# Patient Record
Sex: Female | Born: 1935
Health system: Southern US, Community
[De-identification: ages and names within clinical notes are randomized; demographics above are authoritative.]

## PROBLEM LIST (undated history)

## (undated) DIAGNOSIS — J84112 Idiopathic pulmonary fibrosis: Secondary | ICD-10-CM

## (undated) DIAGNOSIS — I509 Heart failure, unspecified: Secondary | ICD-10-CM

## (undated) DIAGNOSIS — S81812A Laceration without foreign body, left lower leg, initial encounter: Secondary | ICD-10-CM

## (undated) DIAGNOSIS — K649 Unspecified hemorrhoids: Secondary | ICD-10-CM

## (undated) HISTORY — PX: TONSILLECTOMY: SUR1361

## (undated) HISTORY — DX: Unspecified hemorrhoids: K64.9

## (undated) HISTORY — DX: Laceration without foreign body, left lower leg, initial encounter: S81.812A

## (undated) HISTORY — DX: Idiopathic pulmonary fibrosis: J84.112

## (undated) HISTORY — PX: BREAST EXCISIONAL BIOPSY: SUR124

---

## 1998-04-09 ENCOUNTER — Other Ambulatory Visit: Admission: RE | Admit: 1998-04-09 | Discharge: 1998-04-09 | Payer: Self-pay | Admitting: Obstetrics and Gynecology

## 1999-04-13 ENCOUNTER — Other Ambulatory Visit: Admission: RE | Admit: 1999-04-13 | Discharge: 1999-04-13 | Payer: Self-pay | Admitting: Obstetrics and Gynecology

## 1999-08-16 ENCOUNTER — Encounter: Admission: RE | Admit: 1999-08-16 | Discharge: 1999-08-16 | Payer: Self-pay | Admitting: Obstetrics and Gynecology

## 1999-08-16 ENCOUNTER — Encounter: Payer: Self-pay | Admitting: Obstetrics and Gynecology

## 2000-05-25 ENCOUNTER — Other Ambulatory Visit: Admission: RE | Admit: 2000-05-25 | Discharge: 2000-05-25 | Payer: Self-pay | Admitting: Obstetrics and Gynecology

## 2000-08-18 ENCOUNTER — Encounter: Payer: Self-pay | Admitting: Obstetrics and Gynecology

## 2000-08-18 ENCOUNTER — Encounter: Admission: RE | Admit: 2000-08-18 | Discharge: 2000-08-18 | Payer: Self-pay | Admitting: Obstetrics and Gynecology

## 2001-07-09 ENCOUNTER — Other Ambulatory Visit: Admission: RE | Admit: 2001-07-09 | Discharge: 2001-07-09 | Payer: Self-pay | Admitting: Obstetrics and Gynecology

## 2001-08-01 ENCOUNTER — Encounter: Admission: RE | Admit: 2001-08-01 | Discharge: 2001-08-01 | Payer: Self-pay | Admitting: Obstetrics and Gynecology

## 2001-08-01 ENCOUNTER — Encounter: Payer: Self-pay | Admitting: Obstetrics and Gynecology

## 2001-09-12 ENCOUNTER — Encounter: Payer: Self-pay | Admitting: Obstetrics and Gynecology

## 2001-09-12 ENCOUNTER — Encounter: Admission: RE | Admit: 2001-09-12 | Discharge: 2001-09-12 | Payer: Self-pay | Admitting: Obstetrics and Gynecology

## 2002-07-22 ENCOUNTER — Other Ambulatory Visit: Admission: RE | Admit: 2002-07-22 | Discharge: 2002-07-22 | Payer: Self-pay | Admitting: Obstetrics and Gynecology

## 2002-08-19 ENCOUNTER — Encounter: Payer: Self-pay | Admitting: Obstetrics and Gynecology

## 2002-08-19 ENCOUNTER — Encounter: Admission: RE | Admit: 2002-08-19 | Discharge: 2002-08-19 | Payer: Self-pay | Admitting: Obstetrics and Gynecology

## 2003-08-25 ENCOUNTER — Encounter: Admission: RE | Admit: 2003-08-25 | Discharge: 2003-08-25 | Payer: Self-pay | Admitting: Obstetrics and Gynecology

## 2004-03-25 ENCOUNTER — Encounter: Admission: RE | Admit: 2004-03-25 | Discharge: 2004-03-25 | Payer: Self-pay | Admitting: Obstetrics and Gynecology

## 2004-08-26 ENCOUNTER — Encounter: Admission: RE | Admit: 2004-08-26 | Discharge: 2004-08-26 | Payer: Self-pay | Admitting: Obstetrics and Gynecology

## 2004-10-05 ENCOUNTER — Other Ambulatory Visit: Admission: RE | Admit: 2004-10-05 | Discharge: 2004-10-05 | Payer: Self-pay | Admitting: *Deleted

## 2005-09-06 ENCOUNTER — Encounter: Admission: RE | Admit: 2005-09-06 | Discharge: 2005-09-06 | Payer: Self-pay | Admitting: Internal Medicine

## 2006-05-31 ENCOUNTER — Encounter: Admission: RE | Admit: 2006-05-31 | Discharge: 2006-05-31 | Payer: Self-pay | Admitting: Obstetrics and Gynecology

## 2006-09-11 ENCOUNTER — Encounter: Admission: RE | Admit: 2006-09-11 | Discharge: 2006-09-11 | Payer: Self-pay | Admitting: Obstetrics and Gynecology

## 2006-10-24 ENCOUNTER — Other Ambulatory Visit: Admission: RE | Admit: 2006-10-24 | Discharge: 2006-10-24 | Payer: Self-pay | Admitting: Obstetrics & Gynecology

## 2007-09-17 ENCOUNTER — Encounter: Admission: RE | Admit: 2007-09-17 | Discharge: 2007-09-17 | Payer: Self-pay | Admitting: Obstetrics and Gynecology

## 2007-10-28 ENCOUNTER — Emergency Department (HOSPITAL_COMMUNITY): Admission: EM | Admit: 2007-10-28 | Discharge: 2007-10-28 | Payer: Self-pay | Admitting: Emergency Medicine

## 2008-09-22 ENCOUNTER — Encounter: Admission: RE | Admit: 2008-09-22 | Discharge: 2008-09-22 | Payer: Self-pay | Admitting: Obstetrics and Gynecology

## 2009-08-07 ENCOUNTER — Encounter: Admission: RE | Admit: 2009-08-07 | Discharge: 2009-08-07 | Payer: Self-pay | Admitting: Internal Medicine

## 2009-09-03 ENCOUNTER — Encounter (INDEPENDENT_AMBULATORY_CARE_PROVIDER_SITE_OTHER): Payer: Self-pay | Admitting: *Deleted

## 2009-09-23 ENCOUNTER — Encounter: Admission: RE | Admit: 2009-09-23 | Discharge: 2009-09-23 | Payer: Self-pay | Admitting: Internal Medicine

## 2010-03-21 ENCOUNTER — Encounter: Payer: Self-pay | Admitting: Obstetrics & Gynecology

## 2010-03-30 NOTE — Letter (Signed)
Summary: Colonoscopy Date Change Letter  [facility]  [place], [place] [address]   Phone: [phone]  Fax: [phone]      September 03, 2009 MRN: [id number]   [facility] [address] [place], [place]  [address]   Dear [name],   Previously you were recommended to have a repeat colonoscopy around this time. Your chart was recently reviewed by Dr. Lowella Bandy. [name] of [facility]. Follow up colonoscopy is now recommended in August 2014. This revised recommendation is based on current, nationally recognized guidelines for colorectal cancer screening and polyp surveillance. These guidelines are endorsed by the [place] Task Force on Colorectal Cancer as well as numerous other major medical organizations.  Please understand that our recommendation assumes that you do not have any new symptoms such as bleeding, a change in bowel habits, anemia, or significant abdominal discomfort. If you do have any concerning GI symptoms or want to discuss the guideline recommendations, please call to arrange an office visit at your earliest convenience. Otherwise we will keep you in our reminder system and contact you 1-2 months prior to the date listed above to schedule your next colonoscopy.  Thank you,  Lowella Bandy. [name], M.D.  [facility] [phone]

## 2010-07-13 NOTE — Consult Note (Signed)
[name], [name]                ACCOUNT NO.:  [id number]   MEDICAL RECORD NO.:  [id number]          PATIENT TYPE:  EMS   LOCATION:  ED                           FACILITY:  [facility]   PHYSICIAN:  Darene Lamer. Hoxworth, M.D.DATE OF BIRTH:  02-26-1935   DATE OF CONSULTATION:  10/28/2007  DATE OF DISCHARGE:  10/28/2007                                 CONSULTATION   SURGICAL CONSULTATION   CHIEF COMPLAINT:  Anal pain, swelling.   HISTORY OF PRESENT ILLNESS:  I was asked by the emergency room physician  Dr. Lauris Poag, and physician assistant, to evaluate Ms. [name].  She is a  76 year old white female who states she has long history of chronic  constipation and hemorrhoidal symptoms.  She typically takes a stool  softener to keep her bowel movements soft.  She often has to strain.  She gets occasional flare-ups of hemorrhoidal swelling and irritation  managed with over-the-counter medications.  She developed more severe  constipation about 36-48 hours ago with a feeling of difficulty passing  a bowel movement.  She gave herself an enema and did a digital  disimpaction and was able to pass stool but developed quite a lot of  swelling and pain at the anus far beyond what she typically has  experienced and presented to [facility] for evaluation.  She was seen by the physician assistant, who was concerned about a  rectal prolapse, and I was asked to the patient in consultation.  She  states that it is very uncomfortable but the pain is not severe.  She  has not had any bleeding.  No generalized abdominal pain.   PAST MEDICAL HISTORY:  Negative for any serious illness, chronic  illness, hospitalization or surgery.   MEDICATIONS:  None.   ALLERGIES:  None.   SOCIAL HISTORY:  She is nonsmoker, occasional alcohol, married.   FAMILY HISTORY:  Noncontributory.   REVIEW OF SYSTEMS:  Unremarkable except for GI as above.   PHYSICAL EXAM:  Temperature 97.1, heart rate 75,  respirations 20, blood  pressure 125/74.  GENERAL:  She is a thin, well-developed white female in no acute  distress.  SKIN:  Warm and dry.  ABDOMEN:  Nondistended, soft, nontender.  Rectal Exam:  This shows circumferential edematous external and some  internal hemorrhoids without severe inflammation or gangrenous changes.  She is mildly tender on digital rectal exam.  There is no rectal mass.  No solid stool in the rectal vault.   LABORATORY:  UA, CBC, CMET pending at this time.   ASSESSMENT/PLAN:  Edematous circumferential external hemorrhoids.  I do  not seen any evidence of full-thickness rectal prolapse.  There was no  severe inflammation or gangrenous changes at this time and these may be  able to be managed nonoperatively.  The patient will be discharged home.  Will treat with rest, frequent sitz baths, daily MiraLax to keep stools  soft and topical steroid and anesthetic agents.  I will follow up short  term in the office later this week.      Darene Lamer. Hoxworth, M.D.  Electronically  Signed     BTH/MEDQ  D:  10/28/2007  T:  10/28/2007  Job:  [id number]

## 2010-08-23 ENCOUNTER — Other Ambulatory Visit: Payer: Self-pay | Admitting: Internal Medicine

## 2010-08-23 DIAGNOSIS — Z1231 Encounter for screening mammogram for malignant neoplasm of breast: Secondary | ICD-10-CM

## 2010-09-27 ENCOUNTER — Ambulatory Visit
Admission: RE | Admit: 2010-09-27 | Discharge: 2010-09-27 | Disposition: A | Discharge status: Home / Self Care | Payer: Medicare Other | Source: Ambulatory Visit | Attending: Internal Medicine | Admitting: Internal Medicine

## 2010-09-27 DIAGNOSIS — Z1231 Encounter for screening mammogram for malignant neoplasm of breast: Secondary | ICD-10-CM

## 2010-09-28 ENCOUNTER — Ambulatory Visit: Payer: Self-pay

## 2011-01-04 ENCOUNTER — Other Ambulatory Visit: Payer: Self-pay | Admitting: Internal Medicine

## 2011-01-04 ENCOUNTER — Ambulatory Visit
Admission: RE | Admit: 2011-01-04 | Discharge: 2011-01-04 | Disposition: A | Discharge status: Home / Self Care | Payer: Medicare Other | Source: Ambulatory Visit | Attending: Internal Medicine | Admitting: Internal Medicine

## 2011-01-04 DIAGNOSIS — R059 Cough, unspecified: Secondary | ICD-10-CM

## 2011-01-04 DIAGNOSIS — R05 Cough: Secondary | ICD-10-CM

## 2011-03-17 ENCOUNTER — Other Ambulatory Visit: Payer: Self-pay

## 2011-03-17 ENCOUNTER — Encounter (HOSPITAL_COMMUNITY): Payer: Self-pay | Admitting: Emergency Medicine

## 2011-03-17 ENCOUNTER — Emergency Department (HOSPITAL_COMMUNITY)
Admission: EM | Admit: 2011-03-17 | Discharge: 2011-03-17 | Disposition: A | Discharge status: Home / Self Care | Payer: Medicare Other | Attending: Emergency Medicine | Admitting: Emergency Medicine

## 2011-03-17 ENCOUNTER — Emergency Department (HOSPITAL_COMMUNITY): Payer: Medicare Other

## 2011-03-17 DIAGNOSIS — R05 Cough: Secondary | ICD-10-CM | POA: Insufficient documentation

## 2011-03-17 DIAGNOSIS — R079 Chest pain, unspecified: Secondary | ICD-10-CM | POA: Insufficient documentation

## 2011-03-17 DIAGNOSIS — R059 Cough, unspecified: Secondary | ICD-10-CM | POA: Insufficient documentation

## 2011-03-17 DIAGNOSIS — R0602 Shortness of breath: Secondary | ICD-10-CM | POA: Insufficient documentation

## 2011-03-17 DIAGNOSIS — J8410 Pulmonary fibrosis, unspecified: Secondary | ICD-10-CM

## 2011-03-17 LAB — BASIC METABOLIC PANEL
BUN: 20 mg/dL (ref 6–23)
CO2: 26 mEq/L (ref 19–32)
Calcium: 9.5 mg/dL (ref 8.4–10.5)
Chloride: 106 mEq/L (ref 96–112)
Creatinine, Ser: 0.82 mg/dL (ref 0.50–1.10)
GFR calc Af Amer: 80 mL/min — ABNORMAL LOW (ref >90)
GFR calc non Af Amer: 69 mL/min — ABNORMAL LOW (ref >90)
Glucose, Bld: 102 mg/dL — ABNORMAL HIGH (ref 70–99)
Potassium: 3.9 mEq/L (ref 3.5–5.1)
Sodium: 141 mEq/L (ref 135–145)

## 2011-03-17 LAB — CBC
HCT: 41 % (ref 36.0–46.0)
Hemoglobin: 13.9 g/dL (ref 12.0–15.0)
MCH: 33.4 pg (ref 26.0–34.0)
MCHC: 33.9 g/dL (ref 30.0–36.0)
MCV: 98.6 fL (ref 78.0–100.0)
Platelets: 263 10*3/uL (ref 150–400)
RBC: 4.16 MIL/uL (ref 3.87–5.11)
RDW: 13.5 % (ref 11.5–15.5)
WBC: 8.1 10*3/uL (ref 4.0–10.5)

## 2011-03-17 LAB — DIFFERENTIAL
Basophils Absolute: 0 10*3/uL (ref 0.0–0.1)
Basophils Relative: 0 % (ref 0–1)
Eosinophils Absolute: 0.2 10*3/uL (ref 0.0–0.7)
Eosinophils Relative: 2 % (ref 0–5)
Lymphocytes Relative: 40 % (ref 12–46)
Lymphs Abs: 3.2 10*3/uL (ref 0.7–4.0)
Monocytes Absolute: 0.9 10*3/uL (ref 0.1–1.0)
Monocytes Relative: 12 % (ref 3–12)
Neutro Abs: 3.7 10*3/uL (ref 1.7–7.7)
Neutrophils Relative %: 46 % (ref 43–77)

## 2011-03-17 LAB — CARDIAC PANEL(CRET KIN+CKTOT+MB+TROPI)
CK, MB: 3.4 ng/mL (ref 0.3–4.0)
Relative Index: 3 — ABNORMAL HIGH (ref 0.0–2.5)
Total CK: 112 U/L (ref 7–177)
Troponin I: <0.3 ng/mL (ref <0.30)

## 2011-03-17 LAB — POCT I-STAT TROPONIN I: Troponin i, poc: 0 ng/mL (ref 0.00–0.08)

## 2011-03-17 LAB — D-DIMER, QUANTITATIVE (NOT AT ARMC): D-Dimer, Quant: 0.3 ug/mL-FEU (ref 0.00–0.48)

## 2011-03-17 NOTE — ED Notes (Signed)
Pt resting quietly, no s/s of any pain or distress. Pt denies any pain or complaints at this time, plan of care is updated with verbal understanding. Will continue to monitor pt, pt is awaiting MD re-evaluation for disposition.

## 2011-03-17 NOTE — ED Provider Notes (Signed)
History     CSN: [id number]  Arrival date & time 03/17/11  0005   First MD Initiated Contact with Patient 03/17/11 0053      Chief Complaint  Patient presents with  . Chest Pain    (Consider location/radiation/quality/duration/timing/severity/associated sxs/prior treatment) HPI Comments: For the last 6 weeks patient has had upper respiratory symptoms including cough, wheezing, shortness of breath. She was started on a steroid inhaler 2 weeks ago which improved some of the symptoms but a week ago she developed chest tightness. She states it is a band-like distribution involving her entire chest and back.  She states with moderate exertion she has some shortness of breath but denies any other symptoms.  Patient is a 77 y.o. female presenting with chest pain. The history is provided by the patient.  Chest Pain The chest pain began 3 - 5 days ago. Chest pain occurs constantly. The chest pain is worsening. The pain is associated with breathing and coughing. At its most intense, the pain is at 5/10. The pain is currently at 5/10. The severity of the pain is moderate. The quality of the pain is described as aching, heavy and tightness. The pain radiates to the upper back (The pain is a band around her entire chest and back). Chest pain is worsened by deep breathing. Primary symptoms include shortness of breath, cough and wheezing. Pertinent negatives for primary symptoms include no fever, no fatigue, no abdominal pain, no nausea, no vomiting and no dizziness.  Pertinent negatives for associated symptoms include no diaphoresis, no lower extremity edema and no weakness. Treatments tried: She has been on steroid inhaler for the last 2 weeks with mild improvement. Risk factors include no known risk factors.  Pertinent negatives for past medical history include no CAD, no cancer, no COPD, no diabetes, no DVT, no hyperlipidemia, no hypertension, no MI and no PE.  Procedure history is negative for cardiac  catheterization.     History reviewed. No pertinent past medical history.  History reviewed. No pertinent past surgical history.  No family history on file.  History  Substance Use Topics  . Smoking status: Never Smoker   . Smokeless tobacco: Not on file  . Alcohol Use: Yes    OB History    Grav Para Term Preterm Abortions TAB SAB Ect Mult Living                  Review of Systems  Constitutional: Negative for fever, diaphoresis and fatigue.  Respiratory: Positive for cough, shortness of breath and wheezing.   Cardiovascular: Positive for chest pain.  Gastrointestinal: Negative for nausea, vomiting and abdominal pain.  Neurological: Negative for dizziness and weakness.  All other systems reviewed and are negative.    Allergies  Review of patient's allergies indicates no known allergies.  Home Medications   Current Outpatient Rx  Name Route Sig Dispense Refill  . BUDESONIDE 180 MCG/ACT IN AEPB Inhalation Inhale 1 puff into the lungs 2 (two) times daily.    [name] CALCIUM CARBONATE 600 MG PO TABS Oral Take 600 mg by mouth daily.    Creed Copper M PLUS PO TABS Oral Take 1 tablet by mouth daily.      BP 132/73  Pulse 79  Temp(Src) 97.3 F (36.3 C) (Oral)  Resp 17  SpO2 98%  Physical Exam  Nursing note and vitals reviewed. Constitutional: She is oriented to person, place, and time. She appears well-developed and well-nourished. She appears distressed.  HENT:  Head: Normocephalic and  atraumatic.  Mouth/Throat: Oropharynx is clear and moist.  Eyes: EOM are normal. Pupils are equal, round, and reactive to light.  Neck: Normal range of motion. Neck supple.  Cardiovascular: Normal rate, regular rhythm, normal heart sounds and intact distal pulses.  Exam reveals no friction rub.   No murmur heard. Pulmonary/Chest: Effort normal. She has wheezes. She has no rales. She exhibits no tenderness.       Fine crackles in the lung bases  Abdominal: Soft. Bowel sounds are normal. She  exhibits no distension. There is no tenderness. There is no rebound and no guarding.  Musculoskeletal: Normal range of motion. She exhibits no tenderness.       No edema  Neurological: She is alert and oriented to person, place, and time. No cranial nerve deficit.  Skin: Skin is warm and dry. No rash noted.  Psychiatric: She has a normal mood and affect. Her behavior is normal.    ED Course  Procedures (including critical care time)  Labs Reviewed  BASIC METABOLIC PANEL - Abnormal; Notable for the following:    Glucose, Bld 102 (*)    GFR calc non Af Amer 69 (*)    GFR calc Af Amer 80 (*)    All other components within normal limits  CARDIAC PANEL(CRET KIN+CKTOT+MB+TROPI) - Abnormal; Notable for the following:    Relative Index 3.0 (*)    All other components within normal limits  CBC  DIFFERENTIAL  POCT I-STAT TROPONIN I  D-DIMER, QUANTITATIVE  I-STAT TROPONIN I   Dg Chest 2 View  03/17/2011  *RADIOLOGY REPORT*  Clinical Data: Chest pain, cough  CHEST - 2 VIEW  Comparison: 01/04/2011, 08/07/2009  Findings:  stable heart size and vascularity.  Asymmetric interstitial fibrotic changes throughout the left lung and right lower lobe.  No significant interval change.  No superimposed pneumonia or edema.  No effusion or pneumothorax.  Trachea midline. Degenerative changes of the spine.  IMPRESSION: Stable asymmetric interstitial fibrosis.  No superimposed acute process  Original Report Authenticated By: Jerilynn Mages. Daryll Brod, M.D.    Date: 03/17/2011  Rate: 76  Rhythm: normal sinus rhythm  QRS Axis: normal  Intervals: normal  ST/T Wave abnormalities: nonspecific ST changes  Conduction Disutrbances:none  Narrative Interpretation:   Old EKG Reviewed: none available    1. Pulmonary fibrosis       MDM   Patient presenting with constant ongoing chest pain for the last 3-4 days. She states it is gradually getting worse but is not associated with diaphoresis, nausea, vomiting, fever. She  is a TIMI 1 for age only and has no risk factors for coronary artery disease. Her EKG is unremarkable and chest x-ray shows an asymmetrical interstitial fibrosis. She states that over the last 6 weeks she has had persistent cough and bronchial symptoms. She went on Pulmicort 2 weeks ago due to the persistent cough and wheezing and the inability to tolerate oral steroids. She feels as though the symptoms are improving but states she still gets short of breath with exertion. On exam she has fine crackles in both bases and some intermittent wheezing but otherwise exam is within normal limits. Low likelihood for cardiac disease and her cardiac enzymes were negative. With persistent pain for 3 days if this were cardiac but would expect her cardiac enzymes to be elevated and she states the pain has never gone away. Chest x-ray shows an interstitial fibrosis which is most likely the reason for the above physical exam findings. D-dimer is negative and  low likelihood for clots. The rest of the lab work is unremarkable. Findings discussed with the patient and she is going to followup with Dr. Laurann Montana for further evaluation of her pulmonary issues.        Blanchie Dessert, MD 03/17/11 [phone]

## 2011-03-17 NOTE — ED Notes (Addendum)
C/o pain across chest and sob since Monday.  States it feels like a band of heat around chest that is radiating up into neck. Denies nausea and vomiting.

## 2011-09-15 ENCOUNTER — Other Ambulatory Visit: Payer: Self-pay | Admitting: Family Medicine

## 2011-09-15 DIAGNOSIS — Z1231 Encounter for screening mammogram for malignant neoplasm of breast: Secondary | ICD-10-CM

## 2011-09-29 ENCOUNTER — Ambulatory Visit
Admission: RE | Admit: 2011-09-29 | Discharge: 2011-09-29 | Disposition: A | Discharge status: Home / Self Care | Payer: Medicare Other | Source: Ambulatory Visit | Attending: Family Medicine | Admitting: Family Medicine

## 2011-09-29 ENCOUNTER — Other Ambulatory Visit: Payer: Self-pay | Admitting: Internal Medicine

## 2011-09-29 DIAGNOSIS — Z1231 Encounter for screening mammogram for malignant neoplasm of breast: Secondary | ICD-10-CM

## 2011-10-17 ENCOUNTER — Encounter: Payer: Self-pay | Admitting: *Deleted

## 2011-10-17 ENCOUNTER — Telehealth: Payer: Self-pay | Admitting: Internal Medicine

## 2011-10-17 NOTE — Telephone Encounter (Signed)
Left a message for patient to call me. 

## 2011-10-17 NOTE — Telephone Encounter (Signed)
Spoke with patient and scheduled her on 11/18/11 at 2:00 PM. If someone cancels, call her at home or cell [phone]

## 2011-10-18 ENCOUNTER — Encounter: Payer: Self-pay | Admitting: Internal Medicine

## 2011-10-18 ENCOUNTER — Ambulatory Visit (INDEPENDENT_AMBULATORY_CARE_PROVIDER_SITE_OTHER): Payer: Medicare Other | Admitting: Internal Medicine

## 2011-10-18 VITALS — BP 110/76 | HR 80 | Ht 63.25 in | Wt 129.4 lb

## 2011-10-18 DIAGNOSIS — R109 Unspecified abdominal pain: Secondary | ICD-10-CM

## 2011-10-18 DIAGNOSIS — K5900 Constipation, unspecified: Secondary | ICD-10-CM

## 2011-10-18 MED ORDER — MOVIPREP 100 G PO SOLR: ORAL | Status: DC

## 2011-10-18 NOTE — Patient Instructions (Addendum)
You have been scheduled for a colonoscopy with propofol. Please follow written instructions given to you at your visit today.  Please pick up your prep kit at the pharmacy within the next 1-3 days. If you use inhalers (even only as needed), please bring them with you on the day of your procedure. CC: Dr [name]

## 2011-10-18 NOTE — Progress Notes (Signed)
[place] 1934-11-04 MRN [id number]   History of Present Illness:  This is a 77 year old white female with chronic constipation. She has had several episodes of severe lower abdominal pain lasting 10-15 minutes. 2 of them occurred in the morning and one at night. The episode subsided spontaneously. She has had increasing constipation for which she takes MiraLax 17 g weekly and more recently 3 times a week. She has good eating habits and her weight has remained stable. Her last colonoscopy in 2004 mentioned a torturous colon and external and internal hemorrhoids. The hemorrhoids were evaluated by a surgeon in 2009 but treated conservatively they don't seem to be a problem currently. There is no family history of colon cancer. She remembers her mother being constipated.   Past Medical History  Diagnosis Date  . Hemorrhoids    Past Surgical History  Procedure Date  . Breast excisional biopsy     left    reports that she has quit smoking. Her smoking use included Cigarettes. She has never used smokeless tobacco. She reports that she drinks alcohol. She reports that she does not use illicit drugs. family history includes Stroke in her father. No Known Allergies      Review of Systems: Denies upper GI symptoms of heartburn chest pain regurgitation  The remainder of the 10 point ROS is negative except as outlined in H&P   Physical Exam: General appearance  Well developed, in no distress. Eyes- non icteric. HEENT nontraumatic, normocephalic. Mouth no lesions, tongue papillated, no cheilosis. Neck supple without adenopathy, thyroid not enlarged, no carotid bruits, no JVD. Lungs Clear to auscultation bilaterally. Cor normal S1, normal S2, regular rhythm, no murmur,  quiet precordium. Abdomen: Soft relaxed abdomen with normoactive bowel sounds. No distention. Liver edge at costal margin. No surgical scars.  Rectal: Small amount of soft Hemoccult negative stool. Extremities no pedal  edema. Skin no lesions. Neurological alert and oriented x 3. Psychological normal mood and affect.  Assessment and Plan:  Problem #1 Chronic progressive constipation most likely due to slow transit. We need to rule out significant diverticular disease. She has a known redundant colon from a prior colonoscopy in 2004. We will increase her MiraLax to 17 g 3 times a week and will add Colace 100 mg every day. We will set her up for a colonoscopy to rule out obstruction or neoplasm.   10/18/2011 Delfin Edis

## 2011-11-02 ENCOUNTER — Encounter: Payer: Self-pay | Admitting: Internal Medicine

## 2011-11-02 ENCOUNTER — Ambulatory Visit (AMBULATORY_SURGERY_CENTER): Payer: Medicare Other | Admitting: Internal Medicine

## 2011-11-02 VITALS — BP 136/79 | HR 71 | Temp 98.0°F | Resp 36 | Ht 63.0 in | Wt 125.0 lb

## 2011-11-02 DIAGNOSIS — K5900 Constipation, unspecified: Secondary | ICD-10-CM

## 2011-11-02 DIAGNOSIS — R109 Unspecified abdominal pain: Secondary | ICD-10-CM

## 2011-11-02 MED ORDER — SODIUM CHLORIDE 0.9 % IV SOLN: 500.0000 mL | INTRAVENOUS | Status: DC

## 2011-11-02 NOTE — Progress Notes (Signed)
No complaints noted in the recovery room. Maw  Patient did not experience any of the following events: a burn prior to discharge; a fall within the facility; wrong site/side/patient/procedure/implant event; or a hospital transfer or hospital admission upon discharge from the facility. [phone]) Patient did not have preoperative order for IV antibiotic SSI prophylaxis. [phone])

## 2011-11-02 NOTE — Op Note (Signed)
[facility]  [address] [place], [id number]   COLONOSCOPY PROCEDURE REPORT  PATIENT: Terry, Conway  MR#: [id number] BIRTHDATE: 04-29-1934 , 77  yrs. old GENDER: Female ENDOSCOPIST: Lafayette Dragon, MD REFERRED BY:  [name], M.D. PROCEDURE DATE:  11/02/2011 PROCEDURE:   Colonoscopy, diagnostic ASA CLASS:   Class II INDICATIONS:constipation and last colon 2004- redundant colon. MEDICATIONS: MAC sedation, administered by CRNA and Propofol (Diprivan) 300 mg IV  DESCRIPTION OF PROCEDURE:   After the risks and benefits and of the procedure were explained, informed consent was obtained.  A digital rectal exam revealed no abnormalities of the rectum.    The LB CF-[id number]  endoscope was introduced through the anus and advanced to the cecum, which was identified by the ileocecal valve .  The quality of the prep was good, using MoviPrep .  The instrument was then slowly withdrawn as the colon was fully examined.     COLON FINDINGS: Tortuous transverse and right colon, hypotonic colon, decreased abdominal muscle tone, we had to switch to a stiffer scope combined with abdominal pressure and repositioning of the patient.   Mild diverticulosis was noted in the sigmoid colon.     Retroflexed views revealed no abnormalities.     The scope was then withdrawn from the patient and the procedure completed.  COMPLICATIONS: There were no complications. ENDOSCOPIC IMPRESSION: 1.   Tortuous transverse and right colon, hypotonic colon, decreased abdominal muscle tone, we had to switch to a stiffer scope combined with abdominal pressure and repositioning of the patient 2.   Mild diverticulosis was noted in the sigmoid colon  RECOMMENDATIONS: 1.  High fiber diet 2.  continue Miralax 17gm at least 3x/week, or more frequently as needed,may need to add other stimulants such as Senokott, Linzes or Mag. Oxide prn   REPEAT EXAM: In 0 year(s)  for Colonoscopy. , no  recall due to age  cc:  _______________________________ eSigned:  Lafayette Dragon, MD 11/02/2011 11:49 AM

## 2011-11-02 NOTE — Patient Instructions (Addendum)
Handouts were given to your care partner on diverticulosis, high fiber diet and hemorrhoids.  You may resume your current medications today.  Please call if any questions or concerns.    YOU HAD AN ENDOSCOPIC PROCEDURE TODAY AT [facility]: Refer to the procedure report that was given to you for any specific questions about what was found during the examination.  If the procedure report does not answer your questions, please call your gastroenterologist to clarify.  If you requested that your care partner not be given the details of your procedure findings, then the procedure report has been included in a sealed envelope for you to review at your convenience later.  YOU SHOULD EXPECT: Some feelings of bloating in the abdomen. Passage of more gas than usual.  Walking can help get rid of the air that was put into your GI tract during the procedure and reduce the bloating. If you had a lower endoscopy (such as a colonoscopy or flexible sigmoidoscopy) you may notice spotting of blood in your stool or on the toilet paper. If you underwent a bowel prep for your procedure, then you may not have a normal bowel movement for a few days.  DIET: Your first meal following the procedure should be a light meal and then it is ok to progress to your normal diet.  A half-sandwich or bowl of soup is an example of a good first meal.  Heavy or fried foods are harder to digest and may make you feel nauseous or bloated.  Likewise meals heavy in dairy and vegetables can cause extra gas to form and this can also increase the bloating.  Drink plenty of fluids but you should avoid alcoholic beverages for 24 hours.   ACTIVITY: Your care partner should take you home directly after the procedure.  You should plan to take it easy, moving slowly for the rest of the day.  You can resume normal activity the day after the procedure however you should NOT DRIVE or use heavy machinery for 24 hours (because of thFollowing  upper endoscopy (EGD)  Voe sedation medicines used during the test).    SYMPTOMS TO REPORT IMMEDIATELY: A gastroenterologist can be reached at any hour.  During normal business hours, 8:30 AM to 5:00 PM Monday through Friday, call [phone].  After hours and on weekends, please call the GI answering service at [phone] who will take a message and have the physician on call contact you.   Following lower endoscopy (colonoscopy or flexible sigmoidoscopy):  Excessive amounts of blood in the stool  Significant tenderness or worsening of abdominal pains  Swelling of the abdomen that is new, acute  Fever of 100F or higher   FOLLOW UP: If any biopsies were taken you will be contacted by phone or by letter within the next 1-3 weeks.  Call your gastroenterologist if you have not heard about the biopsies in 3 weeks.  Our staff will call the home number listed on your records the next business day following your procedure to check on you and address any questions or concerns that you may have at that time regarding the information given to you following your procedure. This is a courtesy call and so if there is no answer at the home number and we have not heard from you through the emergency physician on call, we will assume that you have returned to your regular daily activities without incident.  SIGNATURES/CONFIDENTIALITY: You and/or your care partner have signed paperwork  which will be entered into your electronic medical record.  These signatures attest to the fact that that the information above on your After Visit Summary has been reviewed and is understood.  Full responsibility of the confidentiality of this discharge information lies with you and/or your care-partner.

## 2011-11-03 ENCOUNTER — Telehealth: Payer: Self-pay

## 2011-11-03 NOTE — Telephone Encounter (Signed)
  Follow up Call-  Call back number 11/02/2011  Post procedure Call Back phone  # [phone]  Permission to leave phone message Yes     Patient questions:  Do you have a fever, pain , or abdominal swelling? no Pain Score  0 *  Have you tolerated food without any problems? yes  Have you been able to return to your normal activities? yes  Do you have any questions about your discharge instructions: Diet   no Medications  no Follow up visit  no  Do you have questions or concerns about your Care? no  Actions: * If pain score is 4 or above: No action needed, pain <4.

## 2011-11-18 ENCOUNTER — Ambulatory Visit: Payer: Medicare Other | Admitting: Internal Medicine

## 2011-12-02 ENCOUNTER — Encounter: Payer: Medicare Other | Admitting: Internal Medicine

## 2012-07-20 ENCOUNTER — Other Ambulatory Visit: Payer: Self-pay | Admitting: Internal Medicine

## 2012-07-20 DIAGNOSIS — N644 Mastodynia: Secondary | ICD-10-CM

## 2012-07-30 ENCOUNTER — Ambulatory Visit
Admission: RE | Admit: 2012-07-30 | Discharge: 2012-07-30 | Disposition: A | Discharge status: Home / Self Care | Payer: Medicare Other | Source: Ambulatory Visit | Attending: Internal Medicine | Admitting: Internal Medicine

## 2012-07-30 ENCOUNTER — Other Ambulatory Visit: Payer: Self-pay | Admitting: Internal Medicine

## 2012-07-30 DIAGNOSIS — N644 Mastodynia: Secondary | ICD-10-CM

## 2012-10-15 ENCOUNTER — Other Ambulatory Visit: Payer: Self-pay

## 2012-10-15 DIAGNOSIS — Z1231 Encounter for screening mammogram for malignant neoplasm of breast: Secondary | ICD-10-CM

## 2012-10-25 ENCOUNTER — Ambulatory Visit
Admission: RE | Admit: 2012-10-25 | Discharge: 2012-10-25 | Disposition: A | Discharge status: Home / Self Care | Payer: Medicare Other | Source: Ambulatory Visit

## 2012-10-25 ENCOUNTER — Ambulatory Visit: Payer: Medicare Other

## 2012-10-25 DIAGNOSIS — Z1231 Encounter for screening mammogram for malignant neoplasm of breast: Secondary | ICD-10-CM

## 2013-09-10 ENCOUNTER — Other Ambulatory Visit: Payer: Self-pay

## 2013-09-10 DIAGNOSIS — Z1231 Encounter for screening mammogram for malignant neoplasm of breast: Secondary | ICD-10-CM

## 2013-10-28 ENCOUNTER — Encounter (INDEPENDENT_AMBULATORY_CARE_PROVIDER_SITE_OTHER): Payer: Self-pay

## 2013-10-28 ENCOUNTER — Ambulatory Visit
Admission: RE | Admit: 2013-10-28 | Discharge: 2013-10-28 | Disposition: A | Discharge status: Home / Self Care | Payer: Medicare HMO | Source: Ambulatory Visit

## 2013-10-28 ENCOUNTER — Ambulatory Visit: Payer: Medicare Other

## 2013-10-28 DIAGNOSIS — Z1231 Encounter for screening mammogram for malignant neoplasm of breast: Secondary | ICD-10-CM

## 2014-08-11 ENCOUNTER — Ambulatory Visit
Admission: RE | Admit: 2014-08-11 | Discharge: 2014-08-11 | Disposition: A | Discharge status: Home / Self Care | Payer: Medicare HMO | Source: Ambulatory Visit | Attending: Internal Medicine | Admitting: Internal Medicine

## 2014-08-11 ENCOUNTER — Other Ambulatory Visit: Payer: Self-pay | Admitting: Internal Medicine

## 2014-08-11 DIAGNOSIS — R0609 Other forms of dyspnea: Secondary | ICD-10-CM

## 2014-08-11 DIAGNOSIS — R0600 Dyspnea, unspecified: Secondary | ICD-10-CM

## 2014-09-29 ENCOUNTER — Other Ambulatory Visit: Payer: Self-pay

## 2014-09-29 DIAGNOSIS — Z1231 Encounter for screening mammogram for malignant neoplasm of breast: Secondary | ICD-10-CM

## 2014-10-20 ENCOUNTER — Other Ambulatory Visit (INDEPENDENT_AMBULATORY_CARE_PROVIDER_SITE_OTHER): Payer: Medicare HMO

## 2014-10-20 ENCOUNTER — Ambulatory Visit (INDEPENDENT_AMBULATORY_CARE_PROVIDER_SITE_OTHER): Payer: Medicare HMO | Admitting: Pulmonary Disease

## 2014-10-20 ENCOUNTER — Encounter: Payer: Self-pay | Admitting: Pulmonary Disease

## 2014-10-20 VITALS — BP 122/74 | HR 73 | Ht 65.0 in | Wt 131.6 lb

## 2014-10-20 DIAGNOSIS — J84112 Idiopathic pulmonary fibrosis: Secondary | ICD-10-CM

## 2014-10-20 LAB — HEPATIC FUNCTION PANEL
ALT: 14 U/L (ref 0–35)
AST: 22 U/L (ref 0–37)
Albumin: 4.1 g/dL (ref 3.5–5.2)
Alkaline Phosphatase: 67 U/L (ref 39–117)
Bilirubin, Direct: 0.1 mg/dL (ref 0.0–0.3)
Total Bilirubin: 0.6 mg/dL (ref 0.2–1.2)
Total Protein: 7.3 g/dL (ref 6.0–8.3)

## 2014-10-20 NOTE — Progress Notes (Signed)
Subjective:    Patient ID: [name], female    DOB: 02/21/1935, 80 y.o.   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 She had a chest x-ray done 07/2014 which showed bilateral interstitial infiltrates hence the referral. She reports increased dyspnea on exertion, especially when she is climbing stairs over the last few months. She reports severe bout of bronchitis in 2011, CT chest 07/2009 was reviewed-high-resolution images showed peripheral reticulation, basilar honeycombing and mild bronchiectasis consistent with pulmonary fibrosis. I also reviewed a chest x-ray from 06/2006 which showed early interstitial prominence at the left base. She also reports a dry cough, sometimes worse when lying down. She has general decrease in energy level and denies skin rash, arthritis or pedal edema. She smoked less than 10 pack years and quit in 1985-a pack would last her about a week. She has lived in different locations on the [place] with her husband, has been in a recliner for at least the last 25 years. 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 She had a negative Cardiolite stress test in 2004.  Labs from PCP-including LFTs are normal She didn't desaturate to 88% on the second lap but recovered to 91% on the third lap.  Past Medical History  Diagnosis Date  . Hemorrhoids     Past Surgical History  Procedure Laterality Date  . Breast excisional biopsy      left    Allergies  Allergen Reactions  . Prednisone     hallucination    Social History   Social History  . Marital Status: Married    Spouse Name: N/A  . Number of Children: 2  . Years of Education: N/A   Occupational History  . retired    Social History Main Topics  . Smoking status: Former Smoker    Types: Cigarettes  . Smokeless tobacco: Never Used  . Alcohol Use: 4.2 oz/week    7 Shots of liquor per week   Comment: 1 per day  . Drug Use: No  . Sexual Activity: Not on file   Other Topics Concern  . Not on file   Social History Narrative    Family History  Problem Relation Age of Onset  . Stroke Father      Review of Systems  Constitutional: Negative for fever, chills and unexpected weight change.  HENT: Negative for congestion, dental problem, ear pain, nosebleeds, postnasal drip, rhinorrhea, sinus pressure, sneezing, sore throat, trouble swallowing and voice change.   Eyes: Negative for visual disturbance.  Respiratory: Negative for cough, choking and shortness of breath.   Cardiovascular: Negative for chest pain and leg swelling.  Gastrointestinal: Negative for vomiting, abdominal pain and diarrhea.  Genitourinary: Negative for difficulty urinating.  Musculoskeletal: Negative for arthralgias.  Skin: Negative for rash.  Neurological: Negative for tremors, syncope and headaches.  Hematological: Does not bruise/bleed easily.       Objective:   Physical Exam  Gen. Pleasant, well-nourished, in no distress, normal affect ENT - no lesions, no post nasal drip Neck: No JVD, no thyromegaly, no carotid bruits Lungs: no use of accessory muscles, no dullness to percussion,bibasal rales, no rhonchi  Cardiovascular: Rhythm regular, heart sounds  normal, no murmurs or gallops, no peripheral edema Abdomen: soft and non-tender, no hepatosplenomegaly, BS normal. Musculoskeletal: No deformities, no cyanosis or clubbing Neuro:  alert, non focal       Assessment & Plan:

## 2014-10-20 NOTE — Patient Instructions (Signed)
High resolution CT scan of chest  - no contrast Full pFTs Blood work for inflammatory causes We briefly discussed pulmonary fibrosis

## 2014-10-20 NOTE — Assessment & Plan Note (Addendum)
The high-resolution CT appearance from 2011 appears to be typical for idiopathic pulmonary fibrosis. He does seem to have a subpleural peripheral distribution with basilar predominance, some honeycombing and traction bronchiectasis.  High resolution CT scan of chest  - no contrast Full pFTs Blood work for inflammatory causes including CCP, ANA ESR We briefly discussed pulmonary fibrosis and its course  We will regroup in 2 weeks after above testing and discuss medications

## 2014-10-21 LAB — ANA: Anti Nuclear Antibody(ANA): NEGATIVE

## 2014-10-21 LAB — SEDIMENTATION RATE: Sed Rate: 24 mm/hr — ABNORMAL HIGH (ref 0–22)

## 2014-10-22 LAB — CYCLIC CITRUL PEPTIDE ANTIBODY, IGG: Cyclic Citrullin Peptide Ab: <2 U/mL (ref 0.0–5.0)

## 2014-10-27 ENCOUNTER — Ambulatory Visit (INDEPENDENT_AMBULATORY_CARE_PROVIDER_SITE_OTHER)
Admission: RE | Admit: 2014-10-27 | Discharge: 2014-10-27 | Disposition: A | Discharge status: Home / Self Care | Payer: Medicare HMO | Source: Ambulatory Visit | Attending: Pulmonary Disease | Admitting: Pulmonary Disease

## 2014-10-27 DIAGNOSIS — J84112 Idiopathic pulmonary fibrosis: Secondary | ICD-10-CM | POA: Diagnosis not present

## 2014-10-28 ENCOUNTER — Ambulatory Visit (INDEPENDENT_AMBULATORY_CARE_PROVIDER_SITE_OTHER): Payer: Medicare HMO | Admitting: Pulmonary Disease

## 2014-10-28 DIAGNOSIS — J84112 Idiopathic pulmonary fibrosis: Secondary | ICD-10-CM

## 2014-10-28 LAB — PULMONARY FUNCTION TEST
DL/VA % pred: 80 %
DL/VA: 3.78 ml/min/mmHg/L
DLCO unc % pred: 52 %
DLCO unc: 12.16 ml/min/mmHg
FEF 25-75 Post: 3.25 L/sec
FEF 25-75 Pre: 3.49 L/sec
FEF2575-%Change-Post: -6 %
FEF2575-%Pred-Post: 224 %
FEF2575-%Pred-Pre: 240 %
FEV1-%Change-Post: 0 %
FEV1-%Pred-Post: 101 %
FEV1-%Pred-Pre: 102 %
FEV1-Post: 1.96 L
FEV1-Pre: 1.97 L
FEV1FVC-%Change-Post: 0 %
FEV1FVC-%Pred-Pre: 121 %
FEV6-%Change-Post: 0 %
FEV6-%Pred-Post: 88 %
FEV6-%Pred-Pre: 89 %
FEV6-Post: 2.17 L
FEV6-Pre: 2.19 L
FEV6FVC-%Change-Post: 0 %
FEV6FVC-%Pred-Post: 104 %
FEV6FVC-%Pred-Pre: 105 %
FVC-%Change-Post: 0 %
FVC-%Pred-Post: 84 %
FVC-%Pred-Pre: 84 %
FVC-Post: 2.18 L
FVC-Pre: 2.19 L
Post FEV1/FVC ratio: 90 %
Post FEV6/FVC ratio: 99 %
Pre FEV1/FVC ratio: 90 %
Pre FEV6/FVC Ratio: 100 %
RV % pred: 83 %
RV: 1.93 L
TLC % pred: 83 %
TLC: 4.11 L

## 2014-10-28 NOTE — Progress Notes (Signed)
PFT done today. 

## 2014-11-05 ENCOUNTER — Encounter: Payer: Self-pay | Admitting: Pulmonary Disease

## 2014-11-05 ENCOUNTER — Ambulatory Visit (INDEPENDENT_AMBULATORY_CARE_PROVIDER_SITE_OTHER): Payer: Medicare HMO | Admitting: Pulmonary Disease

## 2014-11-05 ENCOUNTER — Telehealth: Payer: Self-pay | Admitting: Pulmonary Disease

## 2014-11-05 VITALS — BP 110/70 | HR 82 | Ht 65.0 in | Wt 132.2 lb

## 2014-11-05 DIAGNOSIS — J84112 Idiopathic pulmonary fibrosis: Secondary | ICD-10-CM

## 2014-11-05 DIAGNOSIS — Z23 Encounter for immunization: Secondary | ICD-10-CM | POA: Diagnosis not present

## 2014-11-05 NOTE — Telephone Encounter (Signed)
Spoke with the pt  She states that she read online that medicare does not cover the [place], but would like to get started on this med  She wants to try and get pt assistance for this  I advised will let RA know that she wants to try med and will go from there

## 2014-11-05 NOTE — Patient Instructions (Signed)
We discussed tests confirming IPF We discussed course & medications - perfenidone & OFEV Call if you are ready to start Flu shot

## 2014-11-05 NOTE — Progress Notes (Signed)
   Subjective:    Patient ID: [name], female    DOB: 18-May-1934, 80 y.o.   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 CXR 07/2014 which showed bilateral interstitial infiltrates hence the referral.  She reports severe bout of bronchitis in 2011, CT chest 07/2009-  peripheral reticulation, basilar honeycombing and mild bronchiectasis consistent with pulmonary fibrosis. CXR from 06/2006 which showed early interstitial prominence at the left base.  She smoked less than 10 pack years and quit in 1985-a pack would last her about a week. She has lived in different locations on the [place] with her husband, has been in a recliner for at least the last 25 years. 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 She had a negative Cardiolite stress test in 2004.  She reports increased dyspnea on exertion, especially when she is climbing stairs over the last few months. She also reports a dry cough, sometimes worse when lying down. She has general decrease in energy level and denies skin rash, arthritis or pedal edema.  Significant tests/ events  Labs from PCP-including LFTs are normal, ANA neg, CCP neg  PFTs - preserved lung volumes, DLCO 52% She did desaturate to 88% on the second lap but recovered to 91% on the third lap   Review of Systems neg for any significant sore throat, dysphagia, itching, sneezing, nasal congestion or excess/ purulent secretions, fever, chills, sweats, unintended wt loss, pleuritic or exertional cp, hempoptysis, orthopnea pnd or change in chronic leg swelling. Also denies presyncope, palpitations, heartburn, abdominal pain, nausea, vomiting, diarrhea or change in bowel or urinary habits, dysuria,hematuria, rash, arthralgias, visual complaints, headache, numbness weakness or ataxia.     Objective:   Physical Exam  Gen. Pleasant, well-nourished, in no distress, normal affect ENT - no lesions, no post  nasal drip Neck: No JVD, no thyromegaly, no carotid bruits Lungs: no use of accessory muscles, no dullness to percussion, bibasal fine rales, no rhonchi  Cardiovascular: Rhythm regular, heart sounds  normal, no murmurs or gallops, no peripheral edema Abdomen: soft and non-tender, no hepatosplenomegaly, BS normal. Musculoskeletal: No deformities, no cyanosis or clubbing Neuro:  alert, non focal        Assessment & Plan:

## 2014-11-06 ENCOUNTER — Ambulatory Visit
Admission: RE | Admit: 2014-11-06 | Discharge: 2014-11-06 | Disposition: A | Discharge status: Home / Self Care | Payer: Medicare HMO | Source: Ambulatory Visit

## 2014-11-06 DIAGNOSIS — Z1231 Encounter for screening mammogram for malignant neoplasm of breast: Secondary | ICD-10-CM

## 2014-11-06 NOTE — Assessment & Plan Note (Signed)
We had an extensive discussion about the diagnosis of IPF, disease course and prognosis and the new treatments available. We discussed these treatments in detail including potential adverse effects. I do not think that a biopsy is necessary here-radiologic appearances typical for IPF. I would advise starting OFEV at 150 mcg twice daily-if tolerated. Her baseline LFTs normal, this will be monitored. We discussed signs of an IPF flare Flu shot will be administered today

## 2014-11-06 NOTE — Telephone Encounter (Signed)
Pl start Rx process for OFEV

## 2014-11-07 NOTE — Telephone Encounter (Signed)
Ofev paperwork initiated.  Placed in Dr Bari Mantis look at to sign form. Will forward to [place] also to contact pt once signed and have pt come pick up Ofev packet.

## 2014-11-12 NOTE — Telephone Encounter (Signed)
IPF patients have a variable course - so difficult to predict for individuals. She may get more sick with a cold than normal people do

## 2014-11-12 NOTE — Telephone Encounter (Signed)
Pt is aware of below. Nothing further needed

## 2014-11-12 NOTE — Telephone Encounter (Signed)
Forms completed and faxed to [facility]. OFEV package up front for patient to pick up Patient will pick up package tomorrow.  Patient wants to know if she is in a dangerous situation.  She wants to know if she gets a cold if it will put her in the hospital.  She wants to know if IPF puts a strain on her heart?  And she asked if she needs to get her "affairs in order" .  Dr. Elsworth Soho, Please advise

## 2014-11-12 NOTE — Telephone Encounter (Signed)
Spoke with Sharyn Lull. Dr. Elsworth Soho is in the office this afternoon and will be able to sign the form. Will forward to [place] to follow.

## 2014-11-14 ENCOUNTER — Telehealth: Payer: Self-pay | Admitting: Pulmonary Disease

## 2014-11-14 NOTE — Telephone Encounter (Signed)
Received call from [place] at [employer].  She has a PA form that she needs Dr. Elsworth Soho to complete for this patient's OFEV She said that she will fax form to me Awaiting form

## 2014-11-17 NOTE — Telephone Encounter (Signed)
OK with me

## 2014-11-17 NOTE — Telephone Encounter (Signed)
Pt wants to talk to nurse in regards to ofev medss. Call back at  [phone]

## 2014-11-17 NOTE — Telephone Encounter (Signed)
I do not see form in PA box, or in RA's folder up front by the fax machine.   Sharyn Lull please advise if you have this form, or if it needs to be re-requested.  Thanks!

## 2014-11-17 NOTE — Telephone Encounter (Signed)
Patient says that she would like to postpone taking the OFEV until she comes back for another visit with Dr. Elsworth Soho.  She doesn't like the idea of taking medication twice a day that "reminds her of the problem" and she is afraid that the medication is not going to do any good .[name] Patient also is going on vacation and doesn't want to start a new medication until she returns.    Called [facility] to notify them of patient's decision.   FYI to Dr. Elsworth Soho

## 2014-11-26 ENCOUNTER — Ambulatory Visit: Payer: Medicare HMO | Admitting: Pulmonary Disease

## 2015-01-26 ENCOUNTER — Telehealth: Payer: Self-pay | Admitting: Pulmonary Disease

## 2015-01-26 NOTE — Telephone Encounter (Signed)
Patient has appointment with Dr. Elsworth Soho next week.  Patient says that she would like referral to [facility] for 2nd opinion. Patient says that she thinks she would like to be on the list for lung transplant. She wants to discuss this with Dr. Elsworth Soho at the next [place] 12/7.  FYI to Dr. Elsworth Soho

## 2015-01-26 NOTE — Telephone Encounter (Signed)
Pt returning call and can be reached @ [phone].[name]

## 2015-01-26 NOTE — Telephone Encounter (Signed)
lmomtcb x1 

## 2015-01-27 NOTE — Telephone Encounter (Signed)
Will discuss on OV

## 2015-02-04 ENCOUNTER — Ambulatory Visit (INDEPENDENT_AMBULATORY_CARE_PROVIDER_SITE_OTHER): Payer: Medicare HMO | Admitting: Pulmonary Disease

## 2015-02-04 ENCOUNTER — Encounter: Payer: Self-pay | Admitting: Pulmonary Disease

## 2015-02-04 VITALS — BP 118/80 | HR 89 | Ht 65.0 in | Wt 131.8 lb

## 2015-02-04 DIAGNOSIS — J84112 Idiopathic pulmonary fibrosis: Secondary | ICD-10-CM

## 2015-02-04 NOTE — Progress Notes (Signed)
   Subjective:    Patient ID: [name], female    DOB: 11-25-1934, 80 y.o.   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 CXR 07/2014 which showed bilateral interstitial infiltrates hence the referral.  She reports severe bout of bronchitis in 2011, CT chest 07/2009-  peripheral reticulation, basilar honeycombing and mild bronchiectasis consistent with pulmonary fibrosis. CXR from 06/2006 which showed early interstitial prominence at the left base.  She smoked less than 10 pack years and quit in 1985-a pack would last her about a week. She has lived in different locations on the [place] with her husband, has been in a recliner for at least the last 25 years. 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 She had a negative Cardiolite stress test in 2004.   02/05/2015  Chief Complaint  Patient presents with  . Follow-up    Would like to get 2nd opinion at [facility].  Wants to discuss getting Lung Transplant.   She reports increased dyspnea on exertion, especially when she is climbing stairs over the last few months. She also reports a dry cough, sometimes worse when lying down. She has general decrease in energy level and denies skin rash, arthritis or pedal edema.  She feels well otherwise, accompanied by her husband-they have numerous questions about prognosis, about treatment She would like a handicap plate card  Significant tests/ events  Labs from PCP-including LFTs are normal, ANA neg, CCP neg  PFTs - preserved lung volumes, DLCO 52% 10/2014 She did desaturate to 88% on the second lap but recovered to 91% on the third lap  Review of Systems neg for any significant sore throat, dysphagia, itching, sneezing, nasal congestion or excess/ purulent secretions, fever, chills, sweats, unintended wt loss, pleuritic or exertional cp, hempoptysis, orthopnea pnd or change in chronic leg swelling. Also denies presyncope,  palpitations, heartburn, abdominal pain, nausea, vomiting, diarrhea or change in bowel or urinary habits, dysuria,hematuria, rash, arthralgias, visual complaints, headache, numbness weakness or ataxia.     Objective:   Physical Exam  Gen. Pleasant, well-nourished, in no distress ENT - no lesions, no post nasal drip Neck: No JVD, no thyromegaly, no carotid bruits Lungs: no use of accessory muscles, no dullness to percussion, bibasal rales, no rhonchi  Cardiovascular: Rhythm regular, heart sounds  normal, no murmurs or gallops, no peripheral edema Musculoskeletal: No deformities, no cyanosis or clubbing        Assessment & Plan:

## 2015-02-04 NOTE — Patient Instructions (Addendum)
Referral to [facility] We discussed activity levels Handicap application Call once you have the appointment

## 2015-02-04 NOTE — Assessment & Plan Note (Addendum)
We discussed prognosis and course of IPF in great detail. Her disease does seem to date back to 2008 on chest x-ray.  Referral to [facility] for second opinion at patient's request. She also requested referral to transplant, but age may be a limiting factor here We discussed activity levels Handicap application  More than 30 minutes spent in above discussion

## 2015-02-05 ENCOUNTER — Telehealth: Payer: Self-pay | Admitting: Pulmonary Disease

## 2015-02-05 NOTE — Telephone Encounter (Signed)
Will leave appt as is then. Nothing further to be done.

## 2015-02-05 NOTE — Telephone Encounter (Signed)
Pt is returning sherry's call  Per referral 12/7 in epic: Faxed records to Dr [facility] office.  Dr Elsworth Soho states ok for pt to wait til 3/1 for appt or she can see someone else in the dept. Called pt & left her vm to call me back ---  Called spoke with pt. She reports if RA stated it was fine to wait until 3/1 then she stated it was fine. FYI to [employer]

## 2015-02-19 DIAGNOSIS — J84112 Idiopathic pulmonary fibrosis: Secondary | ICD-10-CM | POA: Diagnosis not present

## 2015-03-04 ENCOUNTER — Telehealth: Payer: Self-pay | Admitting: Pulmonary Disease

## 2015-03-04 NOTE — Telephone Encounter (Signed)
Patient says that she has an appointment at [facility] in February and will need a follow up appointment with Dr. Elsworth Soho sometime after her appointment at [facility].  She wants to be seen in March.  Advised patient that I would put her on the call back list to schedule her an appointment when Dr. Elsworth Soho opens clinic since he does not have any openings at this time.  Placed patient on appointment list.  Will contact patient once appointments have been opened up.  Nothing further needed.

## 2015-03-16 DIAGNOSIS — M25561 Pain in right knee: Secondary | ICD-10-CM | POA: Diagnosis not present

## 2015-03-17 ENCOUNTER — Telehealth: Payer: Self-pay | Admitting: Pulmonary Disease

## 2015-03-17 NOTE — Telephone Encounter (Signed)
lmtcb X1 for pt  

## 2015-03-18 NOTE — Telephone Encounter (Signed)
Spoke with the pt  She states that she is needing [place] to refax all of her records to [facility]  I advised that our records indicate that we did send her records  I called Otila Kluver at [phone]  She states that she did find our records and nothing further needed from [place]  They are awaiting an opening to get the pt in to be seen  Pt aware  Nothing further needed

## 2015-03-23 DIAGNOSIS — R69 Illness, unspecified: Secondary | ICD-10-CM | POA: Diagnosis not present

## 2015-04-14 DIAGNOSIS — R0609 Other forms of dyspnea: Secondary | ICD-10-CM | POA: Diagnosis not present

## 2015-04-14 DIAGNOSIS — J849 Interstitial pulmonary disease, unspecified: Secondary | ICD-10-CM | POA: Diagnosis not present

## 2015-04-14 DIAGNOSIS — R0602 Shortness of breath: Secondary | ICD-10-CM | POA: Diagnosis not present

## 2015-04-28 ENCOUNTER — Telehealth: Payer: Self-pay | Admitting: Pulmonary Disease

## 2015-04-28 NOTE — Telephone Encounter (Signed)
Spoke with pt and she has not received the full report from [facility] and wants to reschedule appt with Dr Elsworth Soho.  Rescheduled to 05/19/15.

## 2015-04-29 ENCOUNTER — Ambulatory Visit: Payer: Medicare HMO | Admitting: Pulmonary Disease

## 2015-05-11 DIAGNOSIS — M25561 Pain in right knee: Secondary | ICD-10-CM | POA: Diagnosis not present

## 2015-05-14 DIAGNOSIS — H52203 Unspecified astigmatism, bilateral: Secondary | ICD-10-CM | POA: Diagnosis not present

## 2015-05-14 DIAGNOSIS — H524 Presbyopia: Secondary | ICD-10-CM | POA: Diagnosis not present

## 2015-05-14 DIAGNOSIS — H2513 Age-related nuclear cataract, bilateral: Secondary | ICD-10-CM | POA: Diagnosis not present

## 2015-05-19 ENCOUNTER — Ambulatory Visit: Payer: Medicare HMO | Admitting: Pulmonary Disease

## 2015-05-21 DIAGNOSIS — M25561 Pain in right knee: Secondary | ICD-10-CM | POA: Diagnosis not present

## 2015-05-28 DIAGNOSIS — S83281A Other tear of lateral meniscus, current injury, right knee, initial encounter: Secondary | ICD-10-CM | POA: Diagnosis not present

## 2015-05-28 DIAGNOSIS — M94261 Chondromalacia, right knee: Secondary | ICD-10-CM | POA: Diagnosis not present

## 2015-05-28 DIAGNOSIS — S83241A Other tear of medial meniscus, current injury, right knee, initial encounter: Secondary | ICD-10-CM | POA: Diagnosis not present

## 2015-07-08 ENCOUNTER — Other Ambulatory Visit: Payer: Self-pay | Admitting: Internal Medicine

## 2015-07-08 DIAGNOSIS — N644 Mastodynia: Secondary | ICD-10-CM

## 2015-07-10 ENCOUNTER — Other Ambulatory Visit: Payer: Self-pay | Admitting: Internal Medicine

## 2015-07-10 DIAGNOSIS — N644 Mastodynia: Secondary | ICD-10-CM

## 2015-07-15 ENCOUNTER — Ambulatory Visit
Admission: RE | Admit: 2015-07-15 | Discharge: 2015-07-15 | Disposition: A | Discharge status: Home / Self Care | Payer: Medicare HMO | Source: Ambulatory Visit | Attending: Internal Medicine | Admitting: Internal Medicine

## 2015-07-15 DIAGNOSIS — N644 Mastodynia: Secondary | ICD-10-CM

## 2015-08-03 DIAGNOSIS — J8410 Pulmonary fibrosis, unspecified: Secondary | ICD-10-CM | POA: Diagnosis not present

## 2015-08-03 DIAGNOSIS — J849 Interstitial pulmonary disease, unspecified: Secondary | ICD-10-CM | POA: Diagnosis not present

## 2015-08-03 DIAGNOSIS — R0609 Other forms of dyspnea: Secondary | ICD-10-CM | POA: Diagnosis not present

## 2015-08-05 DIAGNOSIS — R0609 Other forms of dyspnea: Secondary | ICD-10-CM | POA: Diagnosis not present

## 2015-08-05 DIAGNOSIS — J849 Interstitial pulmonary disease, unspecified: Secondary | ICD-10-CM | POA: Diagnosis not present

## 2015-08-13 DIAGNOSIS — J849 Interstitial pulmonary disease, unspecified: Secondary | ICD-10-CM | POA: Diagnosis not present

## 2015-08-17 DIAGNOSIS — J84112 Idiopathic pulmonary fibrosis: Secondary | ICD-10-CM | POA: Diagnosis not present

## 2015-08-17 DIAGNOSIS — Z0000 Encounter for general adult medical examination without abnormal findings: Secondary | ICD-10-CM | POA: Diagnosis not present

## 2015-08-17 DIAGNOSIS — Z1389 Encounter for screening for other disorder: Secondary | ICD-10-CM | POA: Diagnosis not present

## 2015-09-12 DIAGNOSIS — J849 Interstitial pulmonary disease, unspecified: Secondary | ICD-10-CM | POA: Diagnosis not present

## 2015-09-28 DIAGNOSIS — J849 Interstitial pulmonary disease, unspecified: Secondary | ICD-10-CM | POA: Diagnosis not present

## 2015-09-28 DIAGNOSIS — Z0000 Encounter for general adult medical examination without abnormal findings: Secondary | ICD-10-CM | POA: Diagnosis not present

## 2015-10-09 ENCOUNTER — Other Ambulatory Visit: Payer: Self-pay | Admitting: Internal Medicine

## 2015-10-09 DIAGNOSIS — Z1231 Encounter for screening mammogram for malignant neoplasm of breast: Secondary | ICD-10-CM

## 2015-10-13 DIAGNOSIS — L570 Actinic keratosis: Secondary | ICD-10-CM | POA: Diagnosis not present

## 2015-10-13 DIAGNOSIS — J849 Interstitial pulmonary disease, unspecified: Secondary | ICD-10-CM | POA: Diagnosis not present

## 2015-10-13 DIAGNOSIS — L814 Other melanin hyperpigmentation: Secondary | ICD-10-CM | POA: Diagnosis not present

## 2015-10-13 DIAGNOSIS — Z85828 Personal history of other malignant neoplasm of skin: Secondary | ICD-10-CM | POA: Diagnosis not present

## 2015-10-13 DIAGNOSIS — L821 Other seborrheic keratosis: Secondary | ICD-10-CM | POA: Diagnosis not present

## 2015-11-11 ENCOUNTER — Ambulatory Visit
Admission: RE | Admit: 2015-11-11 | Discharge: 2015-11-11 | Disposition: A | Discharge status: Home / Self Care | Payer: Medicare HMO | Source: Ambulatory Visit | Attending: Internal Medicine | Admitting: Internal Medicine

## 2015-11-11 DIAGNOSIS — Z1231 Encounter for screening mammogram for malignant neoplasm of breast: Secondary | ICD-10-CM

## 2015-11-13 DIAGNOSIS — J849 Interstitial pulmonary disease, unspecified: Secondary | ICD-10-CM | POA: Diagnosis not present

## 2015-11-30 DIAGNOSIS — R69 Illness, unspecified: Secondary | ICD-10-CM | POA: Diagnosis not present

## 2015-12-13 DIAGNOSIS — J849 Interstitial pulmonary disease, unspecified: Secondary | ICD-10-CM | POA: Diagnosis not present

## 2015-12-22 DIAGNOSIS — R05 Cough: Secondary | ICD-10-CM | POA: Diagnosis not present

## 2015-12-22 DIAGNOSIS — Z9981 Dependence on supplemental oxygen: Secondary | ICD-10-CM | POA: Diagnosis not present

## 2015-12-22 DIAGNOSIS — J849 Interstitial pulmonary disease, unspecified: Secondary | ICD-10-CM | POA: Diagnosis not present

## 2015-12-22 DIAGNOSIS — R0609 Other forms of dyspnea: Secondary | ICD-10-CM | POA: Diagnosis not present

## 2015-12-22 DIAGNOSIS — Z87891 Personal history of nicotine dependence: Secondary | ICD-10-CM | POA: Diagnosis not present

## 2016-01-08 DIAGNOSIS — J84112 Idiopathic pulmonary fibrosis: Secondary | ICD-10-CM | POA: Diagnosis not present

## 2016-01-08 DIAGNOSIS — J209 Acute bronchitis, unspecified: Secondary | ICD-10-CM | POA: Diagnosis not present

## 2016-01-13 DIAGNOSIS — J849 Interstitial pulmonary disease, unspecified: Secondary | ICD-10-CM | POA: Diagnosis not present

## 2016-02-12 DIAGNOSIS — J849 Interstitial pulmonary disease, unspecified: Secondary | ICD-10-CM | POA: Diagnosis not present

## 2016-03-14 DIAGNOSIS — R69 Illness, unspecified: Secondary | ICD-10-CM | POA: Diagnosis not present

## 2016-03-14 DIAGNOSIS — J849 Interstitial pulmonary disease, unspecified: Secondary | ICD-10-CM | POA: Diagnosis not present

## 2016-03-21 DIAGNOSIS — R69 Illness, unspecified: Secondary | ICD-10-CM | POA: Diagnosis not present

## 2016-03-21 DIAGNOSIS — J849 Interstitial pulmonary disease, unspecified: Secondary | ICD-10-CM | POA: Diagnosis not present

## 2016-04-05 DIAGNOSIS — Z7952 Long term (current) use of systemic steroids: Secondary | ICD-10-CM | POA: Diagnosis not present

## 2016-04-05 DIAGNOSIS — R0602 Shortness of breath: Secondary | ICD-10-CM | POA: Diagnosis not present

## 2016-04-05 DIAGNOSIS — R0609 Other forms of dyspnea: Secondary | ICD-10-CM | POA: Diagnosis not present

## 2016-04-05 DIAGNOSIS — J849 Interstitial pulmonary disease, unspecified: Secondary | ICD-10-CM | POA: Diagnosis not present

## 2016-04-05 DIAGNOSIS — Z87891 Personal history of nicotine dependence: Secondary | ICD-10-CM | POA: Diagnosis not present

## 2016-04-14 DIAGNOSIS — J849 Interstitial pulmonary disease, unspecified: Secondary | ICD-10-CM | POA: Diagnosis not present

## 2016-04-21 DIAGNOSIS — J849 Interstitial pulmonary disease, unspecified: Secondary | ICD-10-CM | POA: Diagnosis not present

## 2016-05-12 DIAGNOSIS — J849 Interstitial pulmonary disease, unspecified: Secondary | ICD-10-CM | POA: Diagnosis not present

## 2016-05-16 DIAGNOSIS — H524 Presbyopia: Secondary | ICD-10-CM | POA: Diagnosis not present

## 2016-05-16 DIAGNOSIS — H52203 Unspecified astigmatism, bilateral: Secondary | ICD-10-CM | POA: Diagnosis not present

## 2016-05-19 DIAGNOSIS — J849 Interstitial pulmonary disease, unspecified: Secondary | ICD-10-CM | POA: Diagnosis not present

## 2016-06-12 DIAGNOSIS — J849 Interstitial pulmonary disease, unspecified: Secondary | ICD-10-CM | POA: Diagnosis not present

## 2016-06-13 DIAGNOSIS — J8410 Pulmonary fibrosis, unspecified: Secondary | ICD-10-CM | POA: Diagnosis not present

## 2016-06-13 DIAGNOSIS — R509 Fever, unspecified: Secondary | ICD-10-CM | POA: Diagnosis not present

## 2016-06-13 DIAGNOSIS — J111 Influenza due to unidentified influenza virus with other respiratory manifestations: Secondary | ICD-10-CM | POA: Diagnosis not present

## 2016-06-13 DIAGNOSIS — R05 Cough: Secondary | ICD-10-CM | POA: Diagnosis not present

## 2016-06-19 DIAGNOSIS — J849 Interstitial pulmonary disease, unspecified: Secondary | ICD-10-CM | POA: Diagnosis not present

## 2016-07-12 DIAGNOSIS — J849 Interstitial pulmonary disease, unspecified: Secondary | ICD-10-CM | POA: Diagnosis not present

## 2016-07-19 DIAGNOSIS — J849 Interstitial pulmonary disease, unspecified: Secondary | ICD-10-CM | POA: Diagnosis not present

## 2016-08-08 DIAGNOSIS — R0609 Other forms of dyspnea: Secondary | ICD-10-CM | POA: Diagnosis not present

## 2016-08-08 DIAGNOSIS — R0600 Dyspnea, unspecified: Secondary | ICD-10-CM | POA: Diagnosis not present

## 2016-08-08 DIAGNOSIS — Z87891 Personal history of nicotine dependence: Secondary | ICD-10-CM | POA: Diagnosis not present

## 2016-08-08 DIAGNOSIS — J849 Interstitial pulmonary disease, unspecified: Secondary | ICD-10-CM | POA: Diagnosis not present

## 2016-08-08 DIAGNOSIS — R918 Other nonspecific abnormal finding of lung field: Secondary | ICD-10-CM | POA: Diagnosis not present

## 2016-08-08 DIAGNOSIS — J479 Bronchiectasis, uncomplicated: Secondary | ICD-10-CM | POA: Diagnosis not present

## 2016-08-08 DIAGNOSIS — R05 Cough: Secondary | ICD-10-CM | POA: Diagnosis not present

## 2016-08-12 DIAGNOSIS — J849 Interstitial pulmonary disease, unspecified: Secondary | ICD-10-CM | POA: Diagnosis not present

## 2016-08-19 DIAGNOSIS — J849 Interstitial pulmonary disease, unspecified: Secondary | ICD-10-CM | POA: Diagnosis not present

## 2016-08-22 DIAGNOSIS — Z131 Encounter for screening for diabetes mellitus: Secondary | ICD-10-CM | POA: Diagnosis not present

## 2016-08-22 DIAGNOSIS — M8580 Other specified disorders of bone density and structure, unspecified site: Secondary | ICD-10-CM | POA: Diagnosis not present

## 2016-08-22 DIAGNOSIS — Z1389 Encounter for screening for other disorder: Secondary | ICD-10-CM | POA: Diagnosis not present

## 2016-08-22 DIAGNOSIS — J84112 Idiopathic pulmonary fibrosis: Secondary | ICD-10-CM | POA: Diagnosis not present

## 2016-08-22 DIAGNOSIS — Z0000 Encounter for general adult medical examination without abnormal findings: Secondary | ICD-10-CM | POA: Diagnosis not present

## 2016-08-22 DIAGNOSIS — R05 Cough: Secondary | ICD-10-CM | POA: Diagnosis not present

## 2016-09-07 DIAGNOSIS — M26623 Arthralgia of bilateral temporomandibular joint: Secondary | ICD-10-CM | POA: Diagnosis not present

## 2016-09-07 DIAGNOSIS — H6123 Impacted cerumen, bilateral: Secondary | ICD-10-CM | POA: Diagnosis not present

## 2016-09-11 DIAGNOSIS — J849 Interstitial pulmonary disease, unspecified: Secondary | ICD-10-CM | POA: Diagnosis not present

## 2016-09-18 DIAGNOSIS — J849 Interstitial pulmonary disease, unspecified: Secondary | ICD-10-CM | POA: Diagnosis not present

## 2016-09-22 DIAGNOSIS — M8588 Other specified disorders of bone density and structure, other site: Secondary | ICD-10-CM | POA: Diagnosis not present

## 2016-09-30 ENCOUNTER — Other Ambulatory Visit: Payer: Self-pay | Admitting: Internal Medicine

## 2016-09-30 DIAGNOSIS — Z1231 Encounter for screening mammogram for malignant neoplasm of breast: Secondary | ICD-10-CM

## 2016-10-10 DIAGNOSIS — M8589 Other specified disorders of bone density and structure, multiple sites: Secondary | ICD-10-CM | POA: Diagnosis not present

## 2016-10-12 DIAGNOSIS — J849 Interstitial pulmonary disease, unspecified: Secondary | ICD-10-CM | POA: Diagnosis not present

## 2016-10-18 DIAGNOSIS — I788 Other diseases of capillaries: Secondary | ICD-10-CM | POA: Diagnosis not present

## 2016-10-18 DIAGNOSIS — Z85828 Personal history of other malignant neoplasm of skin: Secondary | ICD-10-CM | POA: Diagnosis not present

## 2016-10-18 DIAGNOSIS — L814 Other melanin hyperpigmentation: Secondary | ICD-10-CM | POA: Diagnosis not present

## 2016-10-18 DIAGNOSIS — D692 Other nonthrombocytopenic purpura: Secondary | ICD-10-CM | POA: Diagnosis not present

## 2016-10-18 DIAGNOSIS — L738 Other specified follicular disorders: Secondary | ICD-10-CM | POA: Diagnosis not present

## 2016-10-18 DIAGNOSIS — L821 Other seborrheic keratosis: Secondary | ICD-10-CM | POA: Diagnosis not present

## 2016-10-18 DIAGNOSIS — L7211 Pilar cyst: Secondary | ICD-10-CM | POA: Diagnosis not present

## 2016-10-18 DIAGNOSIS — D1801 Hemangioma of skin and subcutaneous tissue: Secondary | ICD-10-CM | POA: Diagnosis not present

## 2016-10-19 DIAGNOSIS — J849 Interstitial pulmonary disease, unspecified: Secondary | ICD-10-CM | POA: Diagnosis not present

## 2016-11-12 DIAGNOSIS — J849 Interstitial pulmonary disease, unspecified: Secondary | ICD-10-CM | POA: Diagnosis not present

## 2016-11-14 ENCOUNTER — Ambulatory Visit: Payer: Medicare HMO

## 2016-11-14 ENCOUNTER — Ambulatory Visit
Admission: RE | Admit: 2016-11-14 | Discharge: 2016-11-14 | Disposition: A | Discharge status: Home / Self Care | Payer: Medicare HMO | Source: Ambulatory Visit | Attending: Internal Medicine | Admitting: Internal Medicine

## 2016-11-14 DIAGNOSIS — Z1231 Encounter for screening mammogram for malignant neoplasm of breast: Secondary | ICD-10-CM | POA: Diagnosis not present

## 2016-11-19 DIAGNOSIS — J849 Interstitial pulmonary disease, unspecified: Secondary | ICD-10-CM | POA: Diagnosis not present

## 2016-11-28 DIAGNOSIS — R69 Illness, unspecified: Secondary | ICD-10-CM | POA: Diagnosis not present

## 2016-12-12 DIAGNOSIS — J849 Interstitial pulmonary disease, unspecified: Secondary | ICD-10-CM | POA: Diagnosis not present

## 2016-12-19 DIAGNOSIS — J849 Interstitial pulmonary disease, unspecified: Secondary | ICD-10-CM | POA: Diagnosis not present

## 2016-12-28 DIAGNOSIS — J849 Interstitial pulmonary disease, unspecified: Secondary | ICD-10-CM | POA: Diagnosis not present

## 2016-12-28 DIAGNOSIS — Z87891 Personal history of nicotine dependence: Secondary | ICD-10-CM | POA: Diagnosis not present

## 2016-12-28 DIAGNOSIS — R0609 Other forms of dyspnea: Secondary | ICD-10-CM | POA: Diagnosis not present

## 2016-12-28 DIAGNOSIS — R0602 Shortness of breath: Secondary | ICD-10-CM | POA: Diagnosis not present

## 2017-01-12 DIAGNOSIS — J849 Interstitial pulmonary disease, unspecified: Secondary | ICD-10-CM | POA: Diagnosis not present

## 2017-01-19 DIAGNOSIS — J849 Interstitial pulmonary disease, unspecified: Secondary | ICD-10-CM | POA: Diagnosis not present

## 2017-01-26 DIAGNOSIS — L821 Other seborrheic keratosis: Secondary | ICD-10-CM | POA: Diagnosis not present

## 2017-01-26 DIAGNOSIS — Z85828 Personal history of other malignant neoplasm of skin: Secondary | ICD-10-CM | POA: Diagnosis not present

## 2017-02-11 DIAGNOSIS — J849 Interstitial pulmonary disease, unspecified: Secondary | ICD-10-CM | POA: Diagnosis not present

## 2017-02-18 DIAGNOSIS — J849 Interstitial pulmonary disease, unspecified: Secondary | ICD-10-CM | POA: Diagnosis not present

## 2017-02-22 ENCOUNTER — Telehealth: Payer: Self-pay | Admitting: Pulmonary Disease

## 2017-02-22 NOTE — Telephone Encounter (Signed)
Spoke with patient. She is currently being seen by [facility] for ILD. She wants to switch from RA to MR.   RA, are you ok with her switching to MR?   MR, are you ok with taking her on as a new patient?   Please advise. Thanks!

## 2017-02-24 NOTE — Telephone Encounter (Signed)
Happy to see patient in ILD program. Pleae coordinate with Raquel Sarna to put patient in 30 min slot. First avail or on 03/21/17 Tuesday we are doing a ILD clinic in PM. Will be 30 min  Dr. Brand Males, M.D., [facility] Pulmonary and Critical Care Medicine Staff Physician, [place] Director - Interstitial Lung Disease  Program  Pulmonary [place] at [place], [place], [address]  Pager: [phone], If no answer or between  15:00h - 7:00h: call [phone] Telephone: [phone]

## 2017-02-24 NOTE — Telephone Encounter (Signed)
Will forward to EP to follow up on appt for 1/22 for the ILD appt.  No openings prior to 1/22 and there are no openings on the afternoon of 1/22 at this time.

## 2017-02-27 NOTE — Telephone Encounter (Signed)
Per MR he is opening afternoon clinic on 03/21/17 for ILD patients only. Patient is coming in on 03/21/17 at 1:30. Patient is switching from RA to MR. Both approved.

## 2017-03-02 DIAGNOSIS — M859 Disorder of bone density and structure, unspecified: Secondary | ICD-10-CM | POA: Diagnosis not present

## 2017-03-08 DIAGNOSIS — S20211A Contusion of right front wall of thorax, initial encounter: Secondary | ICD-10-CM | POA: Diagnosis not present

## 2017-03-08 DIAGNOSIS — R0781 Pleurodynia: Secondary | ICD-10-CM | POA: Diagnosis not present

## 2017-03-13 DIAGNOSIS — R69 Illness, unspecified: Secondary | ICD-10-CM | POA: Diagnosis not present

## 2017-03-14 DIAGNOSIS — J849 Interstitial pulmonary disease, unspecified: Secondary | ICD-10-CM | POA: Diagnosis not present

## 2017-03-21 ENCOUNTER — Telehealth: Payer: Self-pay | Admitting: Internal Medicine

## 2017-03-21 ENCOUNTER — Ambulatory Visit: Payer: Medicare HMO | Admitting: Internal Medicine

## 2017-03-21 ENCOUNTER — Encounter: Payer: Self-pay | Admitting: Internal Medicine

## 2017-03-21 VITALS — BP 118/78 | HR 74 | Ht 64.0 in | Wt 132.4 lb

## 2017-03-21 DIAGNOSIS — R1310 Dysphagia, unspecified: Secondary | ICD-10-CM

## 2017-03-21 DIAGNOSIS — J849 Interstitial pulmonary disease, unspecified: Secondary | ICD-10-CM | POA: Diagnosis not present

## 2017-03-21 NOTE — Progress Notes (Signed)
Subjective:     Patient ID: [name], female   DOB: [birth year], 83 y.o.   MRN: [id number]  PCP [name], MD  HPI  IOV 03/21/2017  Chief Complaint  Patient presents with  . Advice Only    Former RA pt. Pt states she has been going to [facility] and is on 3-4L O2 with exertion and sometimes at night. Has SOB mainly with exertion.    [name] 83 y.o. female with interstitial lung disease.  History is gained from talking to her and review of the chart.  She was originally diagnosed with interstitial lung disease by Dr. Kara Mead in our practice.  Subsequently she transferred her care to [facility] Dr. [name] who has been following her.  Review of the chart shows the differential diagnosis is IPF versus chronic hypersensitivity pneumonitis based on some air trapping done in her high-resolution CT scan of the chest in 2017/2018.  She is known to have chronic stable interstitial lung disease on previous imaging dating as back as 2008 but she is only aware of her interstitial lung disease in the recent few years.  There is excellent documentation in Dr. [name] medical notes.  It appears based on the goals of care of the patient and her exertional desaturation that surgical lung biopsy was deferred and anti-fibrotic therapy was deferred.  Patient confirms the same.  She is extremely physically fit and thus good aerobic activity on 4 L of oxygen outpacing people much younger than her.  It appears over many years has been a gradual decline in her fibrosis and pulmonary health but she still extremely functional and does well.  She is mainly here today to have a pulmonary fibrosis resource in [place], [place].  She sees Dr. Lauris Chroman every 4 months.  She prefers to see Dr. Lauris Chroman and do all her monitoring in terms of her 6-minute walk test, pulmonary function test and her high-resolution CT scan at [facility] because she is very comfortable with the personnel there.   Yet because she is local in [place] she wants to tap into the resources here in case of an emergency.  We discussed about alternating her follow-up between myself and Dr. Lauris Chroman every 4 months which would make her visit to either one place every 8 months and she seemed to think this was a good plan.  At this point as she is stable and she is satisfied with all pillars of our care.  She is not interested in pulmonary rehabilitation because rightfully so she exercises pretty actively.  She has been the patient support group and did not find value in it.  She is not a transplant candidate.  And she is deferred anti-fibrotic therapy.  She might or might not be interested in research trial and this was not discussed.  It appears the only new issue other than her overall stability is that for the last 1 year she has mild random dysphagia although no active acid reflux.  This can happen for liquids or solids or tablets and is very random.  She has not seen a gastroenterologist for this.  She is not on any proton pump inhibitors.  In addition she tells me she uses of feather down pillow all her life.  She is also use talcum powder all her life and with the recent lawsuit against [employer] about talcum powder she wonders if this could be contributory to her pulmonary fibrosis.  Otherwise [employer] of  chest physicians interstitial lung disease questionnaire is positive for cigarette smoking from age 82 when she smoked a pack a week to age 40.  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  She did recently try a course of prednisone through Dr. Lauris Chroman for her interstitial lung disease but she did not seem to benefit from this and this is been slowly weaned off.  Her autoimmune antibody at [facility] and in [place] was normal.  Pulmonary function test from [facility] was reviewed and the images of CT scan of the chest done in [place] at [place] visualized  Walking desaturation test on  03/21/2017 185 feet x 3 laps on ROOM AIR:  did not desaturate. Rest pulse ox was 100%, final pulse ox was 88%. HR response 71/min at rest to 120/min at peak exertion. Patient [name]  Did yes Desaturate </= 88% . [name] yes  Desaturated </= 3% points. [name] yes did get tachyardic       has a past medical history of Hemorrhoids.   reports that she has quit smoking. Her smoking use included cigarettes. she has never used smokeless tobacco.  Past Surgical History:  Procedure Laterality Date  . BREAST EXCISIONAL BIOPSY Left    left    No Active Allergies  Immunization History  Administered Date(s) Administered  . Influenza, High Dose Seasonal PF 10/29/2016  . Influenza,inj,Quad PF,6+ Mos 11/05/2014  . Influenza-Unspecified 11/28/2013    Family History  Problem Relation Age of Onset  . Stroke Father      Current Outpatient Medications:  .  calcium carbonate (OS-CAL) 600 MG TABS, Take 600 mg by mouth daily., Disp: , Rfl:  .  LINZESS 145 MCG CAPS capsule, , Disp: , Rfl:  .  polyethylene glycol powder (GLYCOLAX/MIRALAX) powder, Take 17 g by mouth. Three time per week, Disp: , Rfl:    Review of Systems     Objective:   Physical Exam  Constitutional: She is oriented to person, place, and time. She appears well-developed and well-nourished. No distress.  HENT:  Head: Normocephalic and atraumatic.  Right Ear: External ear normal.  Left Ear: External ear normal.  Mouth/Throat: Oropharynx is clear and moist. No oropharyngeal exudate.  Eyes: Conjunctivae and EOM are normal. Pupils are equal, round, and reactive to light. Right eye exhibits no discharge. Left eye exhibits no discharge. No scleral icterus.  Neck: Normal range of motion. Neck supple. No JVD present. No tracheal deviation present. No thyromegaly present.  Cardiovascular: Normal rate, regular rhythm, normal heart sounds and intact distal pulses. Exam reveals no gallop and no friction rub.  No  murmur heard. Pulmonary/Chest: Effort normal. No respiratory distress. She has no wheezes. She has rales. She exhibits no tenderness.  Abdominal: Soft. Bowel sounds are normal. She exhibits no distension and no mass. There is no tenderness. There is no rebound and no guarding.  Musculoskeletal: Normal range of motion. She exhibits no edema or tenderness.  Lymphadenopathy:    She has no cervical adenopathy.  Neurological: She is alert and oriented to person, place, and time. She has normal reflexes. No cranial nerve deficit. She exhibits normal muscle tone. Coordination normal.  Skin: Skin is warm and dry. No rash noted. She is not diaphoretic. No erythema. No pallor.  Psychiatric: She has a normal mood and affect. Her behavior is normal. Judgment and thought content normal.  Vitals reviewed.  Vitals:   03/21/17 1335  BP: 118/78  Pulse: 74  SpO2: 97%  Weight:  132 lb 6.4 oz (60.1 kg)  Height: _0  (1.626 m)        Assessment:       ICD-10-CM   1. ILD (interstitial lung disease) ([place]) J84.9        Plan:      Continue supportive care alternating support between myself and Dr Lauris Chroman Refer GI [place] for Dysphagia - Dr Hilarie Fredrickson Agree with stopping use of talcum powder Please discuss with Dr Lauris Chroman if he thinks Down Pillows could cause HP  Continue exercise like usual with o2 support Start OTC Zegerid 35m daily on empty stomatch or omeprazole 231mdaily or send prescripton for daily protonix 4062m pPI can help in IPF     Followup Next visit with Dr GilLauris Chroman feb 2019 - - testing and monitoring with Dr GilLauris Chromanxt visit with Dr RamChase Caller June/July 2019  We can alternate between Dr GilLauris Chromand Dr RamChase Callerery 4 months and Dr RamChase Callerailable for local resources    > 50% of this > 40 min visit spent in face to face counseling or/and coordination of care    Dr. MurBrand Males.D., F.CSelect Specialty Hospital - LincolnP Pulmonary and Critical Care Medicine Staff Physician, ConWingaterector - Interstitial Lung Disease  Program  Pulmonary FibBuchanan LebCharlestonC,[place]7[address]2119ager: [phone] no answer or between  15:00h - 7:00h: call [phone] Telephone: [phone]

## 2017-03-21 NOTE — Patient Instructions (Addendum)
ICD-10-CM   1. ILD (interstitial lung disease) ([place]) J84.9   2. Dysphagia, unspecified type R13.10     Continue supportive care alternating support between myself and Dr Lauris Chroman Refer GI [place] for Dysphagia - Dr Hilarie Fredrickson Agree with stopping use of talcum powder Please discuss with Dr Lauris Chroman if he thinks Down Pillows could cause HP  Continue exercise like usual with o2 support   Followup Next visit with Dr Lauris Chroman in feb 2019 - - testing and monitoring with Dr Lauris Chroman Next visit with Dr [name] in June/July 2019

## 2017-03-21 NOTE — Telephone Encounter (Signed)
[name]  I advise this to the patient but forgot to write it in instruction. Please call her and address  Start OTC Zegerid 32m daily on empty stomatch or omeprazole 272mdaily or send prescripton for daily protonix 4070m pPI can help in IPF   Dr. MurBrand Males.D., F.CHammond Henry HospitalP Pulmonary and Critical Care Medicine Staff Physician, ConHoonah-Angoonrector - Interstitial Lung Disease  Program  Pulmonary FibBrookside Village LebSanduskyC,[place]7496283ager: [phone] no answer or between  15:00h - 7:00h: call [phone] Telephone: [phone]

## 2017-03-22 ENCOUNTER — Encounter: Payer: Self-pay | Admitting: Gastroenterology

## 2017-03-22 NOTE — Telephone Encounter (Signed)
Called patient on numbers provided. Unable to reach left message for patient to give [place] a call back regarding this.

## 2017-03-22 NOTE — Telephone Encounter (Signed)
Emily: please also get patient email.  I have some articles to share with her on feather duvet

## 2017-03-23 MED ORDER — OMEPRAZOLE-SODIUM BICARBONATE 20-1100 MG PO CAPS: 1.0000 | ORAL_CAPSULE | Freq: Every day | ORAL | 1 refills | Status: DC

## 2017-03-23 NOTE — Telephone Encounter (Signed)
PT returned call. Informed her of the recs per MR. Rx sent to preferred pharmacy. Pt verbalized understanding and denied any further questions or concerns at this time.

## 2017-03-23 NOTE — Addendum Note (Signed)
Addended by: Collier Salina on: 03/23/2017 04:14 PM   Modules accepted: Orders

## 2017-03-24 ENCOUNTER — Telehealth: Payer: Self-pay | Admitting: Internal Medicine

## 2017-03-24 NOTE — Telephone Encounter (Signed)
If she is worried about the bicarb - she can try prilosec 15m OTC instead  Her choice to wait to see GI before she takes prilosec. I do not think will make a difference but if she feels they should view her symptoms without any med on board she can wait  Also, there are some case reports on what is called "feather duvet lung" - she should look it up and I can email her the articles but I need her email id  Dr. MBrand Males M.D., FAdventist Health Walla Walla General HospitalC.P Pulmonary and Critical Care Medicine Staff Physician, CNew SarpyDirector - Interstitial Lung Disease  Program  Pulmonary FRigginsat LBrandon NAlaska [id number] Pager: [phone] If no answer or between  15:00h - 7:00h: call [phone] Telephone: [phone]

## 2017-03-24 NOTE — Telephone Encounter (Signed)
Called pt letting her know the information from MR. Pt expressed understanding. Nothing further needed at this current time.

## 2017-03-24 NOTE — Telephone Encounter (Signed)
Spoke with patient. She had several questions about the Zegerid that MR prescribed for her.   She can not tolerate high doses of sodium in medication or food. It causes her to swell and she does not want to be on a diuretic. She wants to know if the 1,100 mg is the correct dosage of sodium bicarbonate.   She also wants to know if she should take this medication before her appt with GI or wait for their recommendations.   MR, please advise. Thanks!

## 2017-03-28 ENCOUNTER — Encounter: Payer: Self-pay | Admitting: Internal Medicine

## 2017-04-03 DIAGNOSIS — R05 Cough: Secondary | ICD-10-CM | POA: Diagnosis not present

## 2017-04-03 DIAGNOSIS — R0609 Other forms of dyspnea: Secondary | ICD-10-CM | POA: Diagnosis not present

## 2017-04-03 DIAGNOSIS — Z87891 Personal history of nicotine dependence: Secondary | ICD-10-CM | POA: Diagnosis not present

## 2017-04-03 DIAGNOSIS — J849 Interstitial pulmonary disease, unspecified: Secondary | ICD-10-CM | POA: Diagnosis not present

## 2017-04-14 DIAGNOSIS — J849 Interstitial pulmonary disease, unspecified: Secondary | ICD-10-CM | POA: Diagnosis not present

## 2017-04-14 DIAGNOSIS — L570 Actinic keratosis: Secondary | ICD-10-CM | POA: Diagnosis not present

## 2017-04-14 DIAGNOSIS — Z85828 Personal history of other malignant neoplasm of skin: Secondary | ICD-10-CM | POA: Diagnosis not present

## 2017-04-14 DIAGNOSIS — L820 Inflamed seborrheic keratosis: Secondary | ICD-10-CM | POA: Diagnosis not present

## 2017-04-21 DIAGNOSIS — J849 Interstitial pulmonary disease, unspecified: Secondary | ICD-10-CM | POA: Diagnosis not present

## 2017-05-02 ENCOUNTER — Ambulatory Visit: Payer: Medicare HMO | Admitting: Gastroenterology

## 2017-05-02 ENCOUNTER — Encounter: Payer: Self-pay | Admitting: Gastroenterology

## 2017-05-02 VITALS — BP 118/86 | HR 88 | Ht 62.5 in | Wt 132.1 lb

## 2017-05-02 DIAGNOSIS — R05 Cough: Secondary | ICD-10-CM | POA: Diagnosis not present

## 2017-05-02 DIAGNOSIS — K5909 Other constipation: Secondary | ICD-10-CM | POA: Diagnosis not present

## 2017-05-02 DIAGNOSIS — R059 Cough, unspecified: Secondary | ICD-10-CM

## 2017-05-02 NOTE — Progress Notes (Signed)
[facility] Consult Note:  History: [name] 05/02/2017  Referring physician: Dr. Beatrix Shipper Pulmonary  Reason for consult/chief complaint: Cough (have IPF and want to see if the cough is from reflux) and Constipation   Subjective  HPI:   This is an 83 year old woman referred by her primary care and pulmonologist for concerns of reflux and chronic cough.  She has IPF, and I reviewed her most recent office visit with her pulmonologist at [facility] [name]) from 04/03/2017.  Her IPF apparently improved with better exercise tolerance and less coughing when she was on prednisone, so that was recently resumed.  PFTs from that same day "show no obstruction or extra restriction but is substantially reduced DLCO.  Overall values are similar to prior". She does not require supplemental oxygen at rest, but needs 4 L/min with ambulation.  I also reviewed her pulmonary note from Dr. [name] on January 22 , who described her reports of intermittent dysphagia.  She was put on a PPI afterwards out of concern that she might have reflux.  She decided not to take it until seeing me.  Gets paroxysms of cough (sometimes daily, sometimes weekly), so wondering if GERD related. Rare and might happen swallowing a pill or small particle of food or post nasal drip. Does not get heartburn or regurgitation.  Few weeks of metallic taste in her mouth.  She last saw Dr. [name] in 2013 for chronic constipation and a family history of colon cancer in her mother.  Colonoscopy September 2013 revealed internal hemorrhoids and a tortuous/redundant colon. Linzess every 4-5 days b/c does not like the diarrhea it causes.  ROS:  Review of Systems  Constitutional: Negative for appetite change and unexpected weight change.  HENT: Negative for mouth sores and voice change.   Eyes: Negative for pain and redness.  Respiratory: Positive for cough and shortness of breath.   Cardiovascular:  Negative for chest pain and palpitations.  Genitourinary: Negative for dysuria and hematuria.  Musculoskeletal: Negative for arthralgias and myalgias.  Skin: Negative for pallor and rash.  Neurological: Negative for weakness and headaches.  Hematological: Negative for adenopathy.     Past Medical History: Past Medical History:  Diagnosis Date  . Hemorrhoids   . IPF (idiopathic pulmonary fibrosis) ([place])      Past Surgical History: Past Surgical History:  Procedure Laterality Date  . BREAST EXCISIONAL BIOPSY Left    x 2  . TONSILLECTOMY     age 46     Family History: Family History  Problem Relation Age of Onset  . Stroke Father   . Lung cancer Brother        mets    Social History: Social History   Socioeconomic History  . Marital status: Married    Spouse name: None  . Number of children: 2  . Years of education: None  . Highest education level: None  Social Needs  . Financial resource strain: None  . Food insecurity - worry: None  . Food insecurity - inability: None  . Transportation needs - medical: None  . Transportation needs - non-medical: None  Occupational History  . Occupation: retired  Tobacco Use  . Smoking status: Former Smoker    Packs/day: 0.25    Years: 30.00    Pack years: 7.50    Types: Cigarettes    Last attempt to quit: 1989    Years since quitting: 30.1  . Smokeless tobacco: Never Used  Substance and Sexual Activity  .  Alcohol use: Yes    Alcohol/week: 4.2 oz    Types: 7 Shots of liquor per week    Comment: 2 per day  . Drug use: No  . Sexual activity: None  Other Topics Concern  . None  Social History Narrative  . None    Allergies: No Active Allergies  Outpatient Meds: Current Outpatient Medications  Medication Sig Dispense Refill  . benzonatate (TESSALON) 200 MG capsule Take 1 capsule by mouth as needed.    . calcium carbonate (OS-CAL) 600 MG TABS Take 600 mg by mouth daily.    [name] LINZESS 145 MCG CAPS capsule       . predniSONE (DELTASONE) 5 MG tablet Take 5 mg by mouth daily with breakfast.     No current facility-administered medications for this visit.       ___________________________________________________________________ Objective   Exam:  BP 118/86 (BP Location: Left Arm, Patient Position: Sitting, Cuff Size: Normal)   Pulse 88   Ht 5' 2.5" (1.588 m) Comment: height measured without shoes  Wt 132 lb 2 oz (59.9 kg)   BMI 23.78 kg/m    General: this is a(n) well-appearing woman comfortable on room air, normal vocal quality  Eyes: sclera anicteric, no redness  ENT: oral mucosa moist without lesions, no cervical or supraclavicular lymphadenopathy, good dentition  CV: RRR without murmur, S1/S2, no JVD, no peripheral edema  Resp: Fine inspiratory crackles bilaterally, normal RR and effort noted  GI: soft, no tenderness, with active bowel sounds. No guarding or palpable organomegaly noted.  Skin; warm and dry, no rash or jaundice noted  Neuro: awake, alert and oriented x 3. Normal gross motor function and fluent speech  Labs:  No recent data for review other than PFTs as noted above  Assessment: Encounter Diagnoses  Name Primary?  . Cough Yes  . Chronic constipation     Her constipation is long-standing and stable  This cough does not sound reflux related.  It sounds more like a hypersensitive upper airway, perhaps related to her chronic pulmonary condition.  I advised her not to take the PPI, and I do not think invasive testing such as EGD and/or pH testing would be revealing or worth the risks.  Plan:  She is reassured and comfortable with that plan, and will see me as needed.  Thank you for the courtesy of this consult.  Please call me with any questions or concerns.  [name]  CC: [name], MD

## 2017-05-02 NOTE — Patient Instructions (Signed)
If you are age 81 or older, your body mass index should be between 23-30. Your Body mass index is 23.78 kg/m. If this is out of the aforementioned range listed, please consider follow up with your Primary Care Provider.  If you are age 5 or younger, your body mass index should be between 19-25. Your Body mass index is 23.78 kg/m. If this is out of the aformentioned range listed, please consider follow up with your Primary Care Provider.   Follow up as needed. [phone]  Thank you for choosing [facility]  Dr Wilfrid Lund III

## 2017-05-12 DIAGNOSIS — J849 Interstitial pulmonary disease, unspecified: Secondary | ICD-10-CM | POA: Diagnosis not present

## 2017-05-19 DIAGNOSIS — J849 Interstitial pulmonary disease, unspecified: Secondary | ICD-10-CM | POA: Diagnosis not present

## 2017-05-29 DIAGNOSIS — H2513 Age-related nuclear cataract, bilateral: Secondary | ICD-10-CM | POA: Diagnosis not present

## 2017-05-29 DIAGNOSIS — H524 Presbyopia: Secondary | ICD-10-CM | POA: Diagnosis not present

## 2017-05-29 DIAGNOSIS — H52203 Unspecified astigmatism, bilateral: Secondary | ICD-10-CM | POA: Diagnosis not present

## 2017-05-29 DIAGNOSIS — H5213 Myopia, bilateral: Secondary | ICD-10-CM | POA: Diagnosis not present

## 2017-06-07 DIAGNOSIS — H2513 Age-related nuclear cataract, bilateral: Secondary | ICD-10-CM | POA: Diagnosis not present

## 2017-06-12 DIAGNOSIS — J849 Interstitial pulmonary disease, unspecified: Secondary | ICD-10-CM | POA: Diagnosis not present

## 2017-06-19 DIAGNOSIS — J849 Interstitial pulmonary disease, unspecified: Secondary | ICD-10-CM | POA: Diagnosis not present

## 2017-06-21 ENCOUNTER — Ambulatory Visit: Payer: Medicare HMO | Admitting: Internal Medicine

## 2017-06-21 ENCOUNTER — Encounter: Payer: Self-pay | Admitting: Internal Medicine

## 2017-06-21 VITALS — BP 120/62 | HR 73 | Ht 62.5 in | Wt 130.4 lb

## 2017-06-21 DIAGNOSIS — J209 Acute bronchitis, unspecified: Secondary | ICD-10-CM | POA: Diagnosis not present

## 2017-06-21 DIAGNOSIS — R432 Parageusia: Secondary | ICD-10-CM

## 2017-06-21 DIAGNOSIS — J849 Interstitial pulmonary disease, unspecified: Secondary | ICD-10-CM

## 2017-06-21 MED ORDER — PREDNISONE 10 MG PO TABS: 10.0000 mg | ORAL_TABLET | Freq: Every day | ORAL | 0 refills | Status: AC

## 2017-06-21 MED ORDER — DOXYCYCLINE HYCLATE 100 MG PO TABS: 100.0000 mg | ORAL_TABLET | Freq: Two times a day (BID) | ORAL | 0 refills | Status: AC

## 2017-06-21 NOTE — Patient Instructions (Addendum)
Acute bronchitis, unspecified organism  - Take doxycycline 176m po twice daily x 5 days; take after meals and avoid sunlight - increase prednisone to 151mdaily x 5 days and then reduce and continue at baseline of 77m76maily (no higher prednisone due to hx of hallucinations)      ILD (interstitial lung disease) ([place] - clinicaly stable - Dr GilLauris Chroman August 2019  - [facility] or ILD clinic - Nov 2019  Taste issues  - check with GI or PCP   Followup - Dr RamChase Caller Nov 2019 - ILD clinic or regular clinic 30 min slot  - will decide on breathing test at followup

## 2017-06-21 NOTE — Progress Notes (Signed)
Subjective:     Patient ID: [name], female   DOB: 08-28-1934, 83 y.o.   MRN: [id number]  HPI  PCP [name], MD  HPI  IOV 03/21/2017  Chief Complaint  Patient presents with  . Advice Only    Former RA pt. Pt states she has been going to [facility] and is on 3-4L O2 with exertion and sometimes at night. Has SOB mainly with exertion.    [name] 83 y.o. female with interstitial lung disease.  History is gained from talking to her and review of the chart.  She was originally diagnosed with interstitial lung disease by Dr. Kara Mead in our practice.  Subsequently she transferred her care to [facility] Dr. [name] who has been following her.  Review of the chart shows the differential diagnosis is IPF versus chronic hypersensitivity pneumonitis based on some air trapping done in her high-resolution CT scan of the chest in 2017/2018.  She is known to have chronic stable interstitial lung disease on previous imaging dating as back as 2008 but she is only aware of her interstitial lung disease in the recent few years.  There is excellent documentation in Dr. [name] medical notes.  It appears based on the goals of care of the patient and her exertional desaturation that surgical lung biopsy was deferred and anti-fibrotic therapy was deferred.  Patient confirms the same.  She is extremely physically fit and thus good aerobic activity on 4 L of oxygen outpacing people much younger than her.  It appears over many years has been a gradual decline in her fibrosis and pulmonary health but she still extremely functional and does well.  She is mainly here today to have a pulmonary fibrosis resource in [place], [place].  She sees Dr. Lauris Chroman every 4 months.  She prefers to see Dr. Lauris Chroman and do all her monitoring in terms of her 6-minute walk test, pulmonary function test and her high-resolution CT scan at [facility] because she is very comfortable with the personnel  there.  Yet because she is local in [place] she wants to tap into the resources here in case of an emergency.  We discussed about alternating her follow-up between myself and Dr. Lauris Chroman every 4 months which would make her visit to either one place every 8 months and she seemed to think this was a good plan.  At this point as she is stable and she is satisfied with all pillars of our care.  She is not interested in pulmonary rehabilitation because rightfully so she exercises pretty actively.  She has been the patient support group and did not find value in it.  She is not a transplant candidate.  And she is deferred anti-fibrotic therapy.  She might or might not be interested in research trial and this was not discussed.  It appears the only new issue other than her overall stability is that for the last 1 year she has mild random dysphagia although no active acid reflux.  This can happen for liquids or solids or tablets and is very random.  She has not seen a gastroenterologist for this.  She is not on any proton pump inhibitors.  In addition she tells me she uses of feather down pillow all her life.  She is also use talcum powder all her life and with the recent lawsuit against [employer] about talcum powder she wonders if this could be contributory to her pulmonary fibrosis.  Otherwise American  College of chest physicians interstitial lung disease questionnaire is positive for cigarette smoking from age 41 when she smoked a pack a week to age 69.  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  She did recently try a course of prednisone through Dr. Lauris Chroman for her interstitial lung disease but she did not seem to benefit from this and this is been slowly weaned off.  Her autoimmune antibody at [facility] and in [place] was normal.  Pulmonary function test from [facility] was reviewed and the images of CT scan of the chest done in [place] visualized  Walking desaturation test  on 03/21/2017 185 feet x 3 laps on ROOM AIR:  did not desaturate. Rest pulse ox was 100%, final pulse ox was 88%. HR response 71/min at rest to 120/min at peak exertion. Patient [name]  Did yes Desaturate </= 88% . Khori P Ozburn yes  Desaturated </= 3% points. Raejean P Kuhnle yes did get tachyardic   OV 06/21/2017  Chief Complaint  Patient presents with  . Follow-up    Pt saw Dr. Lauris Chroman 2/4.  Pt states she has been doing well up until 10 days ago when she developed a cold and is coughing, postnasal drainage and some mild chest tightness.   Aimar P Carandang presents for follow-up of interstitial lung disease [IPF most likely versus chronic HP less likely],. Is supportive care. She follows mostly at [facility] with Dr. [name]. I am her local resource. This is a scheduled appointment. Last seen by Dr. Lauris Chroman in February 2019. Notes reviewed and patient also confirms the same. It appears that without low-dose prednisone her cough relapsed. Therefore she went back on 5 mg prednisone and she was doing well in terms of her cough. However 10 days ago she developed a runny nose and cold and since then has increased cough compared to baseline. She also has a nasal twang. This a slight increase in sputum production compared to baseline and the sputum is off color. This is not normal for her. The might be some increased wheezing but his sleep quality is good. Overall effort tolerance continues to be good. She exercises with oxygen. At room air at rest she does not need oxygen. Most recently in February 2019 her ILD was deemed stable by Dr. Lauris Chroman. [place] the beach in [place] days she wants treatment recommendation for the current deterioration. She does not want to do high dose prednisone because of fear of hallucinationsthat have happened in the past. She also now wants to alternate pulmonary fibrosis follow-up between [facility] and myself.   She also last several months c/o loss  of taste and asking for directions in resolving this issue       has a past medical history of Hemorrhoids and IPF (idiopathic pulmonary fibrosis) ([place]).   reports that she quit smoking about 30 years ago. Her smoking use included cigarettes. She has a 7.50 pack-year smoking history. She has never used smokeless tobacco.  Past Surgical History:  Procedure Laterality Date  . BREAST EXCISIONAL BIOPSY Left    x 2  . TONSILLECTOMY     age 56    No Active Allergies  Immunization History  Administered Date(s) Administered  . Influenza, High Dose Seasonal PF 10/29/2016  . Influenza,inj,Quad PF,6+ Mos 11/05/2014  . Influenza-Unspecified 11/28/2013    Family History  Problem Relation Age of Onset  . Stroke Father   . Lung cancer Brother  mets     Current Outpatient Medications:  .  benzonatate (TESSALON) 200 MG capsule, Take 1 capsule by mouth as needed., Disp: , Rfl:  .  calcium carbonate (OS-CAL) 600 MG TABS, Take 600 mg by mouth daily., Disp: , Rfl:  .  LINZESS 145 MCG CAPS capsule, , Disp: , Rfl:  .  predniSONE (DELTASONE) 5 MG tablet, Take 5 mg by mouth daily with breakfast., Disp: , Rfl:   Review of Systems     Objective:   Physical Exam  Constitutional: She is oriented to person, place, and time. She appears well-developed and well-nourished. No distress.  HENT:  Head: Normocephalic and atraumatic.  Right Ear: External ear normal.  Left Ear: External ear normal.  Mouth/Throat: Oropharynx is clear and moist. No oropharyngeal exudate.  Nasal twang  Eyes: Pupils are equal, round, and reactive to light. Conjunctivae and EOM are normal. Right eye exhibits no discharge. Left eye exhibits no discharge. No scleral icterus.  Neck: Normal range of motion. Neck supple. No JVD present. No tracheal deviation present. No thyromegaly present.  Cardiovascular: Normal rate, regular rhythm, normal heart sounds and intact distal pulses. Exam reveals no gallop and no friction  rub.  No murmur heard. Pulmonary/Chest: Effort normal. No respiratory distress. She has no wheezes. She has rales. She exhibits no tenderness.  Abdominal: Soft. Bowel sounds are normal. She exhibits no distension and no mass. There is no tenderness. There is no rebound and no guarding.  Musculoskeletal: Normal range of motion. She exhibits no edema or tenderness.  Lymphadenopathy:    She has no cervical adenopathy.  Neurological: She is alert and oriented to person, place, and time. She has normal reflexes. No cranial nerve deficit. She exhibits normal muscle tone. Coordination normal.  Skin: Skin is warm and dry. No rash noted. She is not diaphoretic. No erythema. No pallor.  Psychiatric: She has a normal mood and affect. Her behavior is normal. Judgment and thought content normal.  Vitals reviewed.  Vitals:   06/21/17 0950  BP: 120/62  Pulse: 73  SpO2: 97%  Weight: 130 lb 6.4 oz (59.1 kg)  Height: 5' 2.5" (1.588 m)    Body mass index is 23.47 kg/m.     Assessment:       ICD-10-CM   1. Acute bronchitis, unspecified organism J20.9   2. ILD (interstitial lung disease) ([place]) J84.9   3. Dysgeusia R43.2        Plan:     Acute bronchitis, unspecified organism  - Take doxycycline 1667m po twice daily x 5 days; take after meals and avoid sunlight - increase prednisone to 122mdaily x 5 days and then reduce and continue at baseline of 67m63maily (no higher prednisone due to hx of hallucinations)      ILD (interstitial lung disease) ([place] - clinicaly stable - Dr GilLauris Chroman August 2019  - [facility] or [facility] - Nov 2019  Taste issues  - check with GI or PCP   Followup - Dr RamChase Caller Nov 2019 - ILD clinic or regular clinic 30 min slot  - will decide on breathing test at followup   > 50% of this > 25 min visit spent in face to face counseling or coordination of care   Dr. MurBrand Males.D., F.CKaiser Foundation Hospital - VacavilleP Pulmonary and Critical  Care Medicine Staff Physician, [place]rector - Interstitial Lung Disease  Program  Pulmonary [facility] at [employer], [place], [address]  Pager: [phone], If no answer or between  15:00h - 7:00h: call [phone] Telephone: [phone]

## 2017-06-26 ENCOUNTER — Telehealth: Payer: Self-pay | Admitting: Internal Medicine

## 2017-06-26 NOTE — Telephone Encounter (Signed)
Will call patient on cell phone once we hear from MR on this. Routing back to MR

## 2017-06-26 NOTE — Telephone Encounter (Signed)
Spoke with pt. She is aware of Dr. Golden Pop recommendations. Nothing further was needed.

## 2017-06-26 NOTE — Telephone Encounter (Signed)
She was nervous to go above 42m on prednisone due to concern of hallucination. Does she feel differently now? Is she willing to go up?  She can try  mucinex and OTC cough syrup - if these are not working esp in few to several days then we can consider going up on steroids for a few days provided she is willing

## 2017-06-26 NOTE — Telephone Encounter (Signed)
Called and spoke to pt. Pt states she has completed her pred and doxy and has not seen an improvement in her cough. Pt states she feels she could increase the pred to 63m but will wait to see if MR finds it necessary. Pt denies SOB, CP/tightness, and f/c/s. Pt states she is still producing white mucus with cough. Pt states the Advil Cold and Sinus helped a lot and was taking this prior to doxy and pred. Pt states she will go back on this but wanted to know if MR wanted to add something else to help with cough or just wait it out.  Pt requesting a response back today because she is going to [employer]   MR please advise.Thanks.   No Active Allergies

## 2017-06-26 NOTE — Telephone Encounter (Signed)
Patient called back and is going to be out and would like to be called on her cell phone @ [phone]

## 2017-07-05 DIAGNOSIS — H2511 Age-related nuclear cataract, right eye: Secondary | ICD-10-CM | POA: Diagnosis not present

## 2017-07-05 DIAGNOSIS — H2512 Age-related nuclear cataract, left eye: Secondary | ICD-10-CM | POA: Diagnosis not present

## 2017-07-12 DIAGNOSIS — H2512 Age-related nuclear cataract, left eye: Secondary | ICD-10-CM | POA: Diagnosis not present

## 2017-07-12 DIAGNOSIS — J849 Interstitial pulmonary disease, unspecified: Secondary | ICD-10-CM | POA: Diagnosis not present

## 2017-07-19 DIAGNOSIS — J849 Interstitial pulmonary disease, unspecified: Secondary | ICD-10-CM | POA: Diagnosis not present

## 2017-08-04 DIAGNOSIS — Z961 Presence of intraocular lens: Secondary | ICD-10-CM | POA: Diagnosis not present

## 2017-08-12 DIAGNOSIS — J849 Interstitial pulmonary disease, unspecified: Secondary | ICD-10-CM | POA: Diagnosis not present

## 2017-08-19 DIAGNOSIS — J849 Interstitial pulmonary disease, unspecified: Secondary | ICD-10-CM | POA: Diagnosis not present

## 2017-08-28 DIAGNOSIS — R3915 Urgency of urination: Secondary | ICD-10-CM | POA: Diagnosis not present

## 2017-08-28 DIAGNOSIS — J84112 Idiopathic pulmonary fibrosis: Secondary | ICD-10-CM | POA: Diagnosis not present

## 2017-08-28 DIAGNOSIS — Z0000 Encounter for general adult medical examination without abnormal findings: Secondary | ICD-10-CM | POA: Diagnosis not present

## 2017-08-28 DIAGNOSIS — K5909 Other constipation: Secondary | ICD-10-CM | POA: Diagnosis not present

## 2017-08-28 DIAGNOSIS — M8580 Other specified disorders of bone density and structure, unspecified site: Secondary | ICD-10-CM | POA: Diagnosis not present

## 2017-08-28 DIAGNOSIS — Z1389 Encounter for screening for other disorder: Secondary | ICD-10-CM | POA: Diagnosis not present

## 2017-09-08 DIAGNOSIS — Z87891 Personal history of nicotine dependence: Secondary | ICD-10-CM | POA: Diagnosis not present

## 2017-09-08 DIAGNOSIS — H93293 Other abnormal auditory perceptions, bilateral: Secondary | ICD-10-CM | POA: Diagnosis not present

## 2017-09-08 DIAGNOSIS — H6123 Impacted cerumen, bilateral: Secondary | ICD-10-CM | POA: Diagnosis not present

## 2017-09-08 DIAGNOSIS — H938X3 Other specified disorders of ear, bilateral: Secondary | ICD-10-CM | POA: Diagnosis not present

## 2017-09-08 DIAGNOSIS — Z7289 Other problems related to lifestyle: Secondary | ICD-10-CM | POA: Diagnosis not present

## 2017-09-11 DIAGNOSIS — J849 Interstitial pulmonary disease, unspecified: Secondary | ICD-10-CM | POA: Diagnosis not present

## 2017-09-18 DIAGNOSIS — J849 Interstitial pulmonary disease, unspecified: Secondary | ICD-10-CM | POA: Diagnosis not present

## 2017-10-02 DIAGNOSIS — R0602 Shortness of breath: Secondary | ICD-10-CM | POA: Diagnosis not present

## 2017-10-02 DIAGNOSIS — R0609 Other forms of dyspnea: Secondary | ICD-10-CM | POA: Diagnosis not present

## 2017-10-02 DIAGNOSIS — J849 Interstitial pulmonary disease, unspecified: Secondary | ICD-10-CM | POA: Diagnosis not present

## 2017-10-12 DIAGNOSIS — J849 Interstitial pulmonary disease, unspecified: Secondary | ICD-10-CM | POA: Diagnosis not present

## 2017-10-19 DIAGNOSIS — E7800 Pure hypercholesterolemia, unspecified: Secondary | ICD-10-CM | POA: Diagnosis not present

## 2017-10-19 DIAGNOSIS — J849 Interstitial pulmonary disease, unspecified: Secondary | ICD-10-CM | POA: Diagnosis not present

## 2017-10-19 DIAGNOSIS — I2584 Coronary atherosclerosis due to calcified coronary lesion: Secondary | ICD-10-CM | POA: Diagnosis not present

## 2017-10-23 ENCOUNTER — Other Ambulatory Visit: Payer: Self-pay | Admitting: Internal Medicine

## 2017-10-23 DIAGNOSIS — Z1231 Encounter for screening mammogram for malignant neoplasm of breast: Secondary | ICD-10-CM

## 2017-11-01 DIAGNOSIS — R69 Illness, unspecified: Secondary | ICD-10-CM | POA: Diagnosis not present

## 2017-11-01 DIAGNOSIS — Z85828 Personal history of other malignant neoplasm of skin: Secondary | ICD-10-CM | POA: Diagnosis not present

## 2017-11-01 DIAGNOSIS — C44729 Squamous cell carcinoma of skin of left lower limb, including hip: Secondary | ICD-10-CM | POA: Diagnosis not present

## 2017-11-01 DIAGNOSIS — D485 Neoplasm of uncertain behavior of skin: Secondary | ICD-10-CM | POA: Diagnosis not present

## 2017-11-08 DIAGNOSIS — Z85828 Personal history of other malignant neoplasm of skin: Secondary | ICD-10-CM | POA: Diagnosis not present

## 2017-11-08 DIAGNOSIS — C44729 Squamous cell carcinoma of skin of left lower limb, including hip: Secondary | ICD-10-CM | POA: Diagnosis not present

## 2017-11-12 DIAGNOSIS — J849 Interstitial pulmonary disease, unspecified: Secondary | ICD-10-CM | POA: Diagnosis not present

## 2017-11-14 DIAGNOSIS — Z961 Presence of intraocular lens: Secondary | ICD-10-CM | POA: Diagnosis not present

## 2017-11-19 DIAGNOSIS — J849 Interstitial pulmonary disease, unspecified: Secondary | ICD-10-CM | POA: Diagnosis not present

## 2017-11-20 ENCOUNTER — Ambulatory Visit
Admission: RE | Admit: 2017-11-20 | Discharge: 2017-11-20 | Disposition: A | Discharge status: Home / Self Care | Payer: Medicare HMO | Source: Ambulatory Visit | Attending: Internal Medicine | Admitting: Internal Medicine

## 2017-11-20 DIAGNOSIS — Z1231 Encounter for screening mammogram for malignant neoplasm of breast: Secondary | ICD-10-CM | POA: Diagnosis not present

## 2017-11-27 DIAGNOSIS — R300 Dysuria: Secondary | ICD-10-CM | POA: Diagnosis not present

## 2017-12-12 DIAGNOSIS — J849 Interstitial pulmonary disease, unspecified: Secondary | ICD-10-CM | POA: Diagnosis not present

## 2017-12-13 DIAGNOSIS — L820 Inflamed seborrheic keratosis: Secondary | ICD-10-CM | POA: Diagnosis not present

## 2017-12-13 DIAGNOSIS — L7211 Pilar cyst: Secondary | ICD-10-CM | POA: Diagnosis not present

## 2017-12-13 DIAGNOSIS — D2271 Melanocytic nevi of right lower limb, including hip: Secondary | ICD-10-CM | POA: Diagnosis not present

## 2017-12-13 DIAGNOSIS — D225 Melanocytic nevi of trunk: Secondary | ICD-10-CM | POA: Diagnosis not present

## 2017-12-13 DIAGNOSIS — L570 Actinic keratosis: Secondary | ICD-10-CM | POA: Diagnosis not present

## 2017-12-13 DIAGNOSIS — L821 Other seborrheic keratosis: Secondary | ICD-10-CM | POA: Diagnosis not present

## 2017-12-13 DIAGNOSIS — Z85828 Personal history of other malignant neoplasm of skin: Secondary | ICD-10-CM | POA: Diagnosis not present

## 2017-12-13 DIAGNOSIS — D692 Other nonthrombocytopenic purpura: Secondary | ICD-10-CM | POA: Diagnosis not present

## 2017-12-13 DIAGNOSIS — D1801 Hemangioma of skin and subcutaneous tissue: Secondary | ICD-10-CM | POA: Diagnosis not present

## 2017-12-13 DIAGNOSIS — L814 Other melanin hyperpigmentation: Secondary | ICD-10-CM | POA: Diagnosis not present

## 2017-12-19 DIAGNOSIS — J849 Interstitial pulmonary disease, unspecified: Secondary | ICD-10-CM | POA: Diagnosis not present

## 2018-01-08 ENCOUNTER — Ambulatory Visit: Payer: Medicare HMO | Admitting: Internal Medicine

## 2018-01-12 DIAGNOSIS — J849 Interstitial pulmonary disease, unspecified: Secondary | ICD-10-CM | POA: Diagnosis not present

## 2018-01-19 DIAGNOSIS — J849 Interstitial pulmonary disease, unspecified: Secondary | ICD-10-CM | POA: Diagnosis not present

## 2018-01-23 ENCOUNTER — Telehealth: Payer: Self-pay | Admitting: Internal Medicine

## 2018-01-23 ENCOUNTER — Ambulatory Visit: Payer: Medicare HMO | Admitting: Internal Medicine

## 2018-01-23 ENCOUNTER — Encounter: Payer: Self-pay | Admitting: Internal Medicine

## 2018-01-23 VITALS — BP 110/78 | HR 84 | Ht 64.0 in | Wt 131.2 lb

## 2018-01-23 DIAGNOSIS — J849 Interstitial pulmonary disease, unspecified: Secondary | ICD-10-CM | POA: Diagnosis not present

## 2018-01-23 DIAGNOSIS — R053 Chronic cough: Secondary | ICD-10-CM

## 2018-01-23 DIAGNOSIS — R05 Cough: Secondary | ICD-10-CM

## 2018-01-23 NOTE — Patient Instructions (Addendum)
ICD-10-CM   1. ILD (interstitial lung disease) ([place]) J84.9   2. Chronic cough R05    ILD (interstitial lung disease) ([place])  - stable in recent times but progressive in few years  - continue prednisone 66m per day  - support starting ofev initially at 1059mtwice daily  - we can do this in jan 2020 - continue o2 with exercise - we do not see pneumovax or high dose flu shot in our records - consider having this with [place]orea[place]2019 or with PCP GrLavone OrnMD asap  Chronic cough  - my office to get insurance to approve your tessalon perles for palliative treatment of your cough related to ILD  Followup JAn 2020 ILD clinic to start ofev - will inform Dr GiLauris Chromaneb 2020 with Dr GiLauris Chroman

## 2018-01-23 NOTE — Telephone Encounter (Signed)
[name]  Can you find out why [place]  finds tessalon perles  Expensive? Can you call insurance find out please  Thanks    SIGNATURE    Dr. Brand Males, M.D., F.C.C.P,  Pulmonary and Critical Care Medicine Staff Physician, [place] Director - Interstitial Lung Disease  Program  Pulmonary [place] at [place], [place], [address]  Pager: [phone], If no answer or between  15:00h - 7:00h: call [phone] Telephone: [phone]  5:40 PM 01/23/2018

## 2018-01-23 NOTE — Progress Notes (Signed)
PCP Terry Orn, MD  HPI  IOV 03/21/2017  Chief Complaint  Patient presents with  . Advice Only    Former RA pt. Pt states she has been going to [facility] and is on 3-4L O2 with exertion and sometimes at night. Has SOB mainly with exertion.    [name] 83 y.o. female with interstitial lung disease.  History is gained from talking to her and review of the chart.  She was originally diagnosed with interstitial lung disease by Dr. Kara Conway in our practice.  Subsequently she transferred her care to [facility] Dr. Shana Conway who has been following her.  Review of the chart shows the differential diagnosis is IPF versus chronic hypersensitivity pneumonitis based on some air trapping done in her high-resolution CT scan of the chest in 2017/2018.  She is known to have chronic stable interstitial lung disease on previous imaging dating as back as 2008 but she is only aware of her interstitial lung disease in the recent few years.  There is excellent documentation in Dr. Shana Conway medical notes.  It appears based on the goals of care of the patient and her exertional desaturation that surgical lung biopsy was deferred and anti-fibrotic therapy was deferred.  Patient confirms the same.  She is extremely physically fit and thus good aerobic activity on 4 L of oxygen outpacing people much younger than her.  It appears over many years has been a gradual decline in her fibrosis and pulmonary health but she still extremely functional and does well.  She is mainly here today to have a pulmonary fibrosis resource in [place], [place].  She sees Dr. Lauris Conway every 4 months.  She prefers to see Dr. Lauris Conway and do all her monitoring in terms of her 6-minute walk test, pulmonary function test and her high-resolution CT scan at [facility] because she is very comfortable with the personnel there.  Yet because she is local in [place] she wants to tap into the resources here in case  of an emergency.  We discussed about alternating her follow-up between myself and Dr. Lauris Conway every 4 months which would make her visit to either one place every 8 months and she seemed to think this was a good plan.  At this point as she is stable and she is satisfied with all pillars of our care.  She is not interested in pulmonary rehabilitation because rightfully so she exercises pretty actively.  She has been the patient support group and did not find value in it.  She is not a transplant candidate.  And she is deferred anti-fibrotic therapy.  She might or might not be interested in research trial and this was not discussed.  It appears the only new issue other than her overall stability is that for the last 1 year she has mild random dysphagia although no active acid reflux.  This can happen for liquids or solids or tablets and is very random.  She has not seen a gastroenterologist for this.  She is not on any proton pump inhibitors.  In addition she tells me she uses of feather down pillow all her life.  She is also use talcum powder all her life and with the recent lawsuit against [employer] about talcum powder she wonders if this could be contributory to her pulmonary fibrosis.  Otherwise [name] interstitial lung disease questionnaire is positive for cigarette smoking from age 63 when she smoked a pack a  week to age 49.  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  She did recently try a course of prednisone through Dr. Lauris Conway for her interstitial lung disease but she did not seem to benefit from this and this is been slowly weaned off.  Her autoimmune antibody at [facility] and in [place] was normal.  Pulmonary function test from [facility] was reviewed and the images of CT scan of the chest done in [place] visualized  Walking desaturation test on 03/21/2017 185 feet x 3 laps on ROOM AIR:  did not desaturate. Rest pulse ox was 100%, final  pulse ox was 88%. HR response 71/min at rest to 120/min at peak exertion. Patient [name]  Did yes Desaturate </= 88% . [name] yes  Desaturated </= 3% points. [name] yes did get tachyardic   OV 06/21/2017   Chief Complaint  Patient presents with  . Follow-up    Pt saw Dr. Lauris Conway 2/4.  Pt states she has been doing well up until 10 days ago when she developed a cold and is coughing, postnasal drainage and some mild chest tightness.   [name] presents for follow-up of interstitial lung disease [IPF most likely versus chronic HP less likely],. Is supportive care. She follows mostly at [facility] with Dr. Shana Conway. I am her local resource. This is a scheduled appointment. Last seen by Dr. Lauris Conway in February 2019. Notes reviewed and patient also confirms the same. It appears that without low-dose prednisone her cough relapsed. Therefore she went back on 5 mg prednisone and she was doing well in terms of her cough. However 10 days ago she developed a runny nose and cold and since then has increased cough compared to baseline. She also has a nasal twang. This a slight increase in sputum production compared to baseline and the sputum is off color. This is not normal for her. The might be some increased wheezing but his sleep quality is good. Overall effort tolerance continues to be good. She exercises with oxygen. At room air at rest she does not need oxygen. Most recently in February 2019 her ILD was deemed stable by Dr. Lauris Conway. [place] the [place] in [place] days she wants treatment recommendation for the current deterioration. She does not want to do high dose prednisone because of fear of hallucinationsthat have happened in the past. She also now wants to alternate pulmonary fibrosis follow-up between [facility] and myself.   She also last several months c/o loss of taste and asking for directions in resolving this issue      OV  01/23/2018  Subjective:  Patient ID: [name], female , DOB: [birth year] , age 41 y.o. , MRN: [id number] , ADDRESS: [place] [address]   01/23/2018 -   Chief Complaint  Patient presents with  . Follow-up    States her breathing has been at her baseline. Denies chest pain or chest discomfort. States tesslon helps but it is too expensive.      HPI [facility] 83 y.o. -returns for follow-up of her interstitial lung disease that has a differential diagnosis between IPF and chronic hypersensitivity pneumonitis.  After seeing me last she saw Dr. Lauris Conway in August 2019.  Since then she has been doing stable.  I reviewed those notes.  Compared to recent visits her CT scan is stable and her walk test is stable.  She is able to manage with  2 L oxygen.  She has a portable system.  She does feel that she is unable to do more than she would like to do because of her ILD.  She does admit that compared to 3 years ago she says progressive ILD.  She also has significant cough for which she likes [name] but she says insurance is making him pay $3 a pill and so she is upset about that.  Review of immunization record shows that she has not had Pneumovax or flu shot this year but she says she is up-to-date.  We discussed the new [employer] trial for progressive non-IPF ILD where there was beneficial effect of nintedanib.  We discussed this and she is willing to try this after the new year 2020.  She wants to make sure that her [place] ILD specialist Dr. Lauris Conway is on board with this.  We discussed the side effects in detail.   Results for [name], [name] (MRN [id number]) as of 01/23/2018 17:05  Ref. Range 10/28/2014 16:39 10/03/2017 - [facility]  FVC-Pre Latest Units: L 2.19 1.85  FVC-%Pred-Pre Latest Units: % 84 69%   Results for [name], [name] (MRN [id number]) as of 01/23/2018 17:05  Ref. Range 10/28/2014 16:39 01/23/2018   DLCO unc Latest Units: ml/min/mmHg 12.16 8.36  DLCO unc % pred  Latest Units: % 52 51%   ROS - per HPI     has a past medical history of Hemorrhoids and IPF (idiopathic pulmonary fibrosis) ([place]).   reports that she quit smoking about 30 years ago. Her smoking use included cigarettes. She has a 7.50 pack-year smoking history. She has never used smokeless tobacco.  Past Surgical History:  Procedure Laterality Date  . BREAST EXCISIONAL BIOPSY Left    x 2  . TONSILLECTOMY     age 37    Allergies  Allergen Reactions  . Prednisone     Hallucination when given in high doses     Immunization History  Administered Date(s) Administered  . Influenza, High Dose Seasonal PF 10/29/2016  . Influenza,inj,Quad PF,6+ Mos 11/05/2014  . Influenza-Unspecified 11/28/2013    Family History  Problem Relation Age of Onset  . Stroke Father   . Lung cancer Brother        mets     Current Outpatient Medications:  .  aspirin 81 MG chewable tablet, Chew 81 mg by mouth daily., Disp: , Rfl:  .  atorvastatin (LIPITOR) 10 MG tablet, Take 10 mg by mouth daily., Disp: , Rfl:  .  benzonatate (TESSALON) 200 MG capsule, Take 1 capsule by mouth as needed., Disp: , Rfl:  .  calcium carbonate (OS-CAL) 600 MG TABS, Take 600 mg by mouth daily., Disp: , Rfl:  .  LINZESS 145 MCG CAPS capsule, , Disp: , Rfl:  .  predniSONE (DELTASONE) 5 MG tablet, Take 5 mg by mouth daily with breakfast., Disp: , Rfl:       Objective:   Vitals:   01/23/18 1639  BP: 110/78  Pulse: 84  SpO2: 94%  Weight: 131 lb 3.2 oz (59.5 kg)  Height: _0  (1.626 m)    Estimated body mass index is 22.52 kg/m as calculated from the following:   Height as of this encounter: _1  (1.626 m).   Weight as of this encounter: 131 lb 3.2 oz (59.5 kg).  _2 @  Filed Weights   01/23/18 1639  Weight: 131 lb 3.2 oz (59.5 kg)     Physical Exam  General Appearance:  Alert, cooperative, no distress, appears stated age - younger  , Deconditioned looking - no , OBESE  - no, Sitting on  Wheelchair -  no  Head:    Normocephalic, without obvious abnormality, atraumatic  Eyes:    PERRL, conjunctiva/corneas clear,  Ears:    Normal TM's and external ear canals, both ears  Nose:   Nares normal, septum midline, mucosa normal, no drainage    or sinus tenderness. OXYGEN ON  - no . Patient is @ ra   Throat:   Lips, mucosa, and tongue normal; teeth and gums normal. Cyanosis on lips - no  Neck:   Supple, symmetrical, trachea midline, no adenopathy;    thyroid:  no enlargement/tenderness/nodules; no carotid   bruit or JVD  Back:     Symmetric, no curvature, ROM normal, no CVA tenderness  Lungs:     Distress - no , Wheeze no, Barrell Chest - no, Purse lip breathing - no, Crackles - yes basal crackles   Chest Wall:    No tenderness or deformity.    Heart:    Regular rate and rhythm, S1 and S2 normal, no rub   or gallop, Murmur - no  Breast Exam:    NOT DONE  Abdomen:     Soft, non-tender, bowel sounds active all four quadrants,    no masses, no organomegaly. Visceral obesity - no  Genitalia:   NOT DONE  Rectal:   NOT DONE  Extremities:   Extremities - normal, Has Cane - no, Clubbing - no, Edema - no  Pulses:   2+ and symmetric all extremities  Skin:   Stigmata of Connective Tissue Disease - no  Lymph nodes:   Cervical, supraclavicular, and axillary nodes normal  Psychiatric:  Neurologic:   Pleasant - yes, Anxious - no, Flat affect - no  CAm-ICU - neg, Alert and Oriented x 3 - yes, Moves all 4s - yes, Speech - normal, Cognition - intact           Assessment:       ICD-10-CM   1. ILD (interstitial lung disease) ([place]) J84.9   2. Chronic cough R05        Plan:     Patient Instructions     ICD-10-CM   1. ILD (interstitial lung disease) ([place]) J84.9   2. Chronic cough R05    ILD (interstitial lung disease) ([place])  - stable in recent times but progressive in few years  - continue prednisone 96m per day  - support starting ofev initially at 1087mtwice daily  - we can  do this in jan 2020 - continue o2 with exercise - we do not see pneumovax or high dose flu shot in our records - consider having this with [place] or with PCP GrLavone OrnMD asap  Chronic cough  - my office to get insurance to approve your tessalon perles for palliative treatment of your cough related to ILD  Followup JAn 2020 ILD clinic to start ofev - will inform Dr GiLauris Chromaneb 2020 with Dr GiLauris Conway> 50% of this > 25 min visit spent in face to face counseling or coordination of care - by this undersigned MD - Dr MuBrand MalesThis includes one or more of the following documented above: discussion of test results, diagnostic or treatment recommendations, prognosis, risks and benefits of management options, instructions, education, compliance or risk-factor reduction    SIGNATURE    Dr. MuBrand MalesM.D., F.C.C.P,  Pulmonary and Critical Care  Medicine Staff Physician, [place] Director - Interstitial Lung Disease  Program  Pulmonary [place] at [place], [place], [address]  Pager: [phone], If no answer or between  15:00h - 7:00h: call [phone] Telephone: [phone]  5:37 PM 01/23/2018

## 2018-01-24 ENCOUNTER — Telehealth: Payer: Self-pay | Admitting: Internal Medicine

## 2018-01-24 NOTE — Telephone Encounter (Signed)
[name]  Let [name] know that Dr Lauris Chroman at [facility] is very supportive of starting ofev on her in early 2020  Thanks    52    Dr. Brand Males, M.D., F.C.C.P,  Pulmonary and Critical Care Medicine Staff Physician, [place] Director - Interstitial Lung Disease  Program  Pulmonary [place] at [place], [place], [address]  Pager: [phone], If no answer or between  15:00h - 7:00h: call [phone] Telephone: [phone]  8:09 AM 01/24/2018

## 2018-01-24 NOTE — Telephone Encounter (Signed)
Pt mentioned to me on the phone while I was speaking to her that she does not want to start the med until after Christmas.

## 2018-01-24 NOTE — Telephone Encounter (Signed)
Called and spoke with pt letting her know that Dr. Lauris Chroman was very supportive with her beginning [place] in 2020. Pt expressed understanding.  Pt currently has an appt scheduled with MR 03/08/2018. MR, please advise if it is okay to wait until pt's appt with you to initiate the paperwork for the [facility] or if we should go ahead and do it now? Pt stated if it needed to be done now, she could come by the office to sign where she needs to sign.

## 2018-01-24 NOTE — Telephone Encounter (Signed)
She can do paper work now Regulatory affairs officer when she sees me; whatever is easier for her

## 2018-01-24 NOTE — Telephone Encounter (Signed)
Whatever works -> if she can handle some phone calls between now and Jan 2020 for getting drug ofev approved then she can do it now but if holiday season Is distracting then wait till jan 2020. IT will still take her few weeks to get drug in  [place].   One option is to get drug now but start after holidays

## 2018-01-24 NOTE — Telephone Encounter (Signed)
Insurance does not cover cough medications. Tried [employer] companies before in regards to other cough meds for other pts before and this is what I have been told.

## 2018-01-26 NOTE — Telephone Encounter (Signed)
Called patient, unable to reach left message to give us a call back. 

## 2018-01-29 NOTE — Telephone Encounter (Signed)
Higher prednisone might not help with anxietuy but could potentially help respiratory stuff. So, ok to increase prednisone to 27m per day and monitor

## 2018-01-29 NOTE — Telephone Encounter (Signed)
Called pt letting her know the information stated by MR in regards to the benzonatate and that he stated at her next OV, he would discuss other alternatives with her. Pt expressed understanding.  Pt stated to me she has called Dr. Evalee Mutton office and has placed a message with them. Pt stated at this time of year, she usually becomes anxious. Pt wanted to know if there was any reason her prednisone could possibly be upped from 59m to 14mduring this time of year to see if that would help with everything. Even though pt has placed a call to Dr. GiEvalee Muttonffice, while awaiting to hear from them she wanted to know what MR would have to say in regards to this. MR, please advise. Thanks!

## 2018-01-29 NOTE — Telephone Encounter (Signed)
Please let [name]'\ know that your experience is same as hers; insurance does NOT pay . I will discuss with her some alternatives at followup - gabapentin or research protocols

## 2018-01-30 NOTE — Telephone Encounter (Signed)
Pt is aware of recs from MR and verbalized her understanding. Nothing more needed at this time.

## 2018-02-07 ENCOUNTER — Ambulatory Visit: Payer: Medicare HMO | Admitting: Internal Medicine

## 2018-02-08 DIAGNOSIS — E7800 Pure hypercholesterolemia, unspecified: Secondary | ICD-10-CM | POA: Diagnosis not present

## 2018-02-08 DIAGNOSIS — Z5181 Encounter for therapeutic drug level monitoring: Secondary | ICD-10-CM | POA: Diagnosis not present

## 2018-02-10 DIAGNOSIS — R69 Illness, unspecified: Secondary | ICD-10-CM | POA: Diagnosis not present

## 2018-02-11 DIAGNOSIS — J849 Interstitial pulmonary disease, unspecified: Secondary | ICD-10-CM | POA: Diagnosis not present

## 2018-02-18 DIAGNOSIS — J849 Interstitial pulmonary disease, unspecified: Secondary | ICD-10-CM | POA: Diagnosis not present

## 2018-03-08 ENCOUNTER — Ambulatory Visit: Payer: Medicare HMO | Admitting: Internal Medicine

## 2018-03-08 ENCOUNTER — Encounter: Payer: Self-pay | Admitting: Internal Medicine

## 2018-03-08 VITALS — BP 130/78 | HR 85 | Ht 64.0 in | Wt 132.8 lb

## 2018-03-08 DIAGNOSIS — J849 Interstitial pulmonary disease, unspecified: Secondary | ICD-10-CM

## 2018-03-08 DIAGNOSIS — R05 Cough: Secondary | ICD-10-CM | POA: Diagnosis not present

## 2018-03-08 DIAGNOSIS — R053 Chronic cough: Secondary | ICD-10-CM

## 2018-03-08 DIAGNOSIS — J84112 Idiopathic pulmonary fibrosis: Secondary | ICD-10-CM

## 2018-03-08 NOTE — Progress Notes (Signed)
HPI  IOV 03/21/2017  Chief Complaint  Patient presents with  . Advice Only    Former RA pt. Pt states she has been going to [facility] and is on 3-4L O2 with exertion and sometimes at night. Has SOB mainly with exertion.    [name] 84 y.o. female with interstitial lung disease.  History is gained from talking to her and review of the chart.  She was originally diagnosed with interstitial lung disease by Dr. Kara Mead in our practice.  Subsequently she transferred her care to [facility] Dr. [name] who has been following her.  Review of the chart shows the differential diagnosis is IPF versus chronic hypersensitivity pneumonitis based on some air trapping done in her high-resolution CT scan of the chest in 2017/2018.  She is known to have chronic stable interstitial lung disease on previous imaging dating as back as 2008 but she is only aware of her interstitial lung disease in the recent few years.  There is excellent documentation in Dr. [name] medical notes.  It appears based on the goals of care of the patient and her exertional desaturation that surgical lung biopsy was deferred and anti-fibrotic therapy was deferred.  Patient confirms the same.  She is extremely physically fit and thus good aerobic activity on 4 L of oxygen outpacing people much younger than her.  It appears over many years has been a gradual decline in her fibrosis and pulmonary health but she still extremely functional and does well.  She is mainly here today to have a pulmonary fibrosis resource in [place], [place].  She sees Dr. Lauris Chroman every 4 months.  She prefers to see Dr. Lauris Chroman and do all her monitoring in terms of her 6-minute walk test, pulmonary function test and her high-resolution CT scan at [facility] because she is very comfortable with the personnel there.  Yet because she is local in [place] she wants to tap into the resources here in case of an emergency.  We  discussed about alternating her follow-up between myself and Dr. Lauris Chroman every 4 months which would make her visit to either one place every 8 months and she seemed to think this was a good plan.  At this point as she is stable and she is satisfied with all pillars of our care.  She is not interested in pulmonary rehabilitation because rightfully so she exercises pretty actively.  She has been the patient support group and did not find value in it.  She is not a transplant candidate.  And she is deferred anti-fibrotic therapy.  She might or might not be interested in research trial and this was not discussed.  It appears the only new issue other than her overall stability is that for the last 1 year she has mild random dysphagia although no active acid reflux.  This can happen for liquids or solids or tablets and is very random.  She has not seen a gastroenterologist for this.  She is not on any proton pump inhibitors.  In addition she tells me she uses of feather down pillow all her life.  She is also use talcum powder all her life and with the recent lawsuit against [employer] about talcum powder she wonders if this could be contributory to her pulmonary fibrosis.  Otherwise [employer] of chest physicians interstitial lung disease questionnaire is positive for cigarette smoking from age 82 when she smoked a pack a week to age 44.  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  She did recently try a course of prednisone through Dr. Lauris Chroman for her interstitial lung disease but she did not seem to benefit from this and this is been slowly weaned off.  Her autoimmune antibody at [facility] and in [place] was normal.  Pulmonary function test from [facility] was reviewed and the images of CT scan of the chest done in [place] visualized  Walking desaturation test on 03/21/2017 185 feet x 3 laps on ROOM AIR:  did not desaturate. Rest pulse ox was 100%, final pulse ox was 88%. HR  response 71/min at rest to 120/min at peak exertion. Patient [name]  Did yes Desaturate </= 88% . [name] yes  Desaturated </= 3% points. [name] yes did get tachyardic   OV 06/21/2017   Chief Complaint  Patient presents with  . Follow-up    Pt saw Dr. Lauris Chroman 2/4.  Pt states she has been doing well up until 10 days ago when she developed a cold and is coughing, postnasal drainage and some mild chest tightness.   [name] presents for follow-up of interstitial lung disease [IPF most likely versus chronic HP less likely],. Is supportive care. She follows mostly at [facility] with Dr. [name]. I am her local resource. This is a scheduled appointment. Last seen by Dr. Lauris Chroman in February 2019. Notes reviewed and patient also confirms the same. It appears that without low-dose prednisone her cough relapsed. Therefore she went back on 5 mg prednisone and she was doing well in terms of her cough. However 10 days ago she developed a runny nose and cold and since then has increased cough compared to baseline. She also has a nasal twang. This a slight increase in sputum production compared to baseline and the sputum is off color. This is not normal for her. The might be some increased wheezing but his sleep quality is good. Overall effort tolerance continues to be good. She exercises with oxygen. At room air at rest she does not need oxygen. Most recently in February 2019 her ILD was deemed stable by Dr. Lauris Chroman. [place] the beach [place] days she wants treatment recommendation for the current deterioration. She does not want to do high dose prednisone because of fear of hallucinationsthat have happened in the past. She also now wants to alternate pulmonary fibrosis follow-up between [facility] and myself.   She also last several months c/o loss of taste and asking for directions in resolving this issue      OV 01/23/2018  Subjective:  Patient ID: [name], female , DOB: 06/17/1934 , age 5 y.o. , MRN: [id number] , ADDRESS: [place] [id number]   01/23/2018 -   Chief Complaint  Patient presents with  . Follow-up    States her breathing has been at her baseline. Denies chest pain or chest discomfort. States tesslon helps but it is too expensive.      HPI [facility] 84 y.o. -returns for follow-up of her interstitial lung disease that has a differential diagnosis between IPF and chronic hypersensitivity pneumonitis.  After seeing me last she saw Dr. Lauris Chroman in August 2019.  Since then she has been doing stable.  I reviewed those notes.  Compared to recent visits her CT scan is stable and her walk test is stable.  She is able to manage with 2 L oxygen.  She  has a portable system.  She does feel that she is unable to do more than she would like to do because of her ILD.  She does admit that compared to 3 years ago she says progressive ILD.  She also has significant cough for which she likes Ladona Ridgel but she says insurance is making him pay $3 a pill and so she is upset about that.  Review of immunization record shows that she has not had Pneumovax or flu shot this year but she says she is up-to-date.  We discussed the new [employer] trial for progressive non-IPF ILD where there was beneficial effect of nintedanib.  We discussed this and she is willing to try this after the new year 2020.  She wants to make sure that her [place] ILD specialist Dr. Lauris Chroman is on board with this.  We discussed the side effects in detail.    OV 03/08/2018  Subjective:  Patient ID: [name], female , DOB: 1934/11/22 , age 75 y.o. , MRN: [id number] , ADDRESS: [place] [id number]   03/08/2018 -   Chief Complaint  Patient presents with  . Follow-up    Pt states she has been well since last visit. States she does become SOB with exertion and does have an occ cough with clear mucus. Denies any CP.    ilD  FOLLOWUP   HPI [facility] 84 y.o. -returns for followup. Over xmas 2019 because of social stress increased prednisone to 72m per day but now back to 565mper day. Dyspnea with exergtion continues but stable. Cough that is very bothersome continue.  She takes TeBest boyor this this helps her.  However it is expensive.  She is placed a call to [employer] apparently they are going to try to work out where she can make it more affordable because I ILD is a diagnosis.  She admitted that she has down pillows and feather blankets.  1 of her close friends Mr. TaLovena Leho also has hypersensitivity pneumonitis and sees me advised her to get rid of those and therefore she is in the process of doing that.  She asked about further options and treatment of a cough.  We discussed gabapentin no increasing steroid dose participating in a cough study but she did not want to add these complications to her.  She seems content with the option of trying an inspiratory muscle trainer and seeing if the cough would get better.  Overall given her progressive ILD and the possibility that this is IPF she is willing to now try nintedanib.  We discussed nintedanib extensively.  She denies any heart disease.  She is not on any major anticoagulation.  She does not have any GI issues other than constipation.  She is willing to try nintedanib.  She is nervous about the co-pay and said if the co-pay is too expensive despite charity program then she will not take it.  [facility].    Results for CHJOYCEANN, KRUSERMRN [id number] of 01/23/2018 17:05  Ref. Range 10/28/2014 16:39 10/03/2017 - [facility]  FVC-Pre Latest Units: L 2.19 1.85  FVC-%Pred-Pre Latest Units: % 84 69%   Results for CHVERENICE, WESTRICHMRN [id number] of 01/23/2018 17:05  Ref. Range 10/28/2014 16:39 01/23/2018   DLCO unc Latest Units: ml/min/mmHg 12.16 8.36  DLCO unc % pred Latest Units: % 52  51%  Simple office walk 250 feet x  3 laps goal with forehead probe 03/08/2018   O2 used Room air  Number laps completed 3  Comments about pace brisk  Resting Pulse Ox/HR 99% and 85/min  Final Pulse Ox/HR 90% and 124/min  Desaturated </= 88% no  Desaturated <= 3% points yes  Got Tachycardic >/= 90/min yes  Symptoms at end of test Moderate dyspnea  Miscellaneous comments x      ROS - per HPI     has a past medical history of Hemorrhoids and IPF (idiopathic pulmonary fibrosis) ([place]).   reports that she quit smoking about 31 years ago. Her smoking use included cigarettes. She has a 7.50 pack-year smoking history. She has never used smokeless tobacco.  Past Surgical History:  Procedure Laterality Date  . BREAST EXCISIONAL BIOPSY Left    x 2  . TONSILLECTOMY     age 30    Allergies  Allergen Reactions  . Prednisone     Hallucination when given in high doses     Immunization History  Administered Date(s) Administered  . Influenza, High Dose Seasonal PF 10/29/2016  . Influenza,inj,Quad PF,6+ Mos 11/05/2014  . Influenza-Unspecified 11/28/2013    Family History  Problem Relation Age of Onset  . Stroke Father   . Lung cancer Brother        mets     Current Outpatient Medications:  .  aspirin 81 MG chewable tablet, Chew 81 mg by mouth daily., Disp: , Rfl:  .  atorvastatin (LIPITOR) 10 MG tablet, Take 10 mg by mouth daily., Disp: , Rfl:  .  benzonatate (TESSALON) 200 MG capsule, Take 1 capsule by mouth as needed., Disp: , Rfl:  .  calcium carbonate (OS-CAL) 600 MG TABS, Take 600 mg by mouth daily., Disp: , Rfl:  .  LINZESS 145 MCG CAPS capsule, , Disp: , Rfl:  .  predniSONE (DELTASONE) 5 MG tablet, Take 5 mg by mouth daily with breakfast., Disp: , Rfl:       Objective:   Vitals:   03/08/18 1427  BP: 130/78  Pulse: 85  SpO2: 99%  Weight: 132 lb 12.8 oz (60.2 kg)  Height: _0  (1.626 m)    Estimated body mass index is 22.8 kg/m as calculated from the  following:   Height as of this encounter: _1  (1.626 m).   Weight as of this encounter: 132 lb 12.8 oz (60.2 kg).  _2 @  [employer]   03/08/18 1427  Weight: 132 lb 12.8 oz (60.2 kg)     Physical Exam  General Appearance:    Alert, cooperative, no distress, appears stated age - no , Deconditioned looking - no , OBESE  - no, Sitting on Wheelchair -  no  Head:    Normocephalic, without obvious abnormality, atraumatic  Eyes:    PERRL, conjunctiva/corneas clear,  Ears:    Normal TM's and external ear canals, both ears  Nose:   Nares normal, septum midline, mucosa normal, no drainage    or sinus tenderness. OXYGEN ON  - no . Patient is @ no   Throat:   Lips, mucosa, and tongue normal; teeth and gums normal. Cyanosis on lips - no  Neck:   Supple, symmetrical, trachea midline, no adenopathy;    thyroid:  no enlargement/tenderness/nodules; no carotid   bruit or JVD  Back:     Symmetric, no curvature, ROM normal, no CVA tenderness  Lungs:     Distress - no , Wheeze no,  Barrell Chest - o, Purse lip breathing - no, Crackles - at lung base  Chest Wall:    No tenderness or deformity.    Heart:    Regular rate and rhythm, S1 and S2 normal, no rub   or gallop, Murmur - no  Breast Exam:    NOT DONE  Abdomen:     Soft, non-tender, bowel sounds active all four quadrants,    no masses, no organomegaly. Visceral obesity - no  Genitalia:   NOT DONE  Rectal:   NOT DONE  Extremities:   Extremities - normal, Has Cane - no, Clubbing - no, Edema - no  Pulses:   2+ and symmetric all extremities  Skin:   Stigmata of Connective Tissue Disease - no  Lymph nodes:   Cervical, supraclavicular, and axillary nodes normal  Psychiatric:  Neurologic:   Pleasant - yes, Anxious - no, Flat affect - no  CAm-ICU - neg, Alert and Oriented x 3 - yes, Moves all 4s - yes, Speech - normal, Cognition - intact           Assessment:       ICD-10-CM   1. ILD (interstitial lung disease) ([place]) J84.9   2.  Chronic cough R05   3. IPF (idiopathic pulmonary fibrosis) ([place]) H85.277        Plan:     Patient Instructions     ICD-10-CM   1. ILD (interstitial lung disease) ([place]) J84.9   2. Chronic cough R05   3. IPF (idiopathic pulmonary fibrosis) ([place]) J84.112      Glad stable since last visit but as you know progressive over time Cough is a major issue  Plan - start ofev 166m twice daily  - get rid of all down pillows and blanket/duvet - continue prednisone 529mper day  -discussed several options for cough  - continue tessalon perles - talk to insurance to see iif you can get it cheaper  - try inspiratory muscle trainer  - hold off on options of gabapentin, increased steroid dose or cough study - keep followup with Dr GiLauris Chromannd feb 2020  = please have him do LFT test if on ofev at that time - return to see me end march 2020  Followup  - end march 2020 or sooner if needed;  ILD clinic   .> 50% of this > 40 min visit spent in face to face counseling or/and coordination of care - by this undersigned MD - Dr MuBrand MalesThis includes one or more of the following documented above: discussion of test results, diagnostic or treatment recommendations, prognosis, risks and benefits of management options, instructions, education, compliance or risk-factor reduction   SIGNATURE    Dr. MuBrand MalesM.D., F.C.C.P,  Pulmonary and Critical Care Medicine Staff Physician, CoElizabethirector - Interstitial Lung Disease  Program  Pulmonary Fi[id number]: [phone] no answer or between  15:00h - 7:00h: call [phone] Telephone: [phone]  3:31 PM 03/08/2018

## 2018-03-08 NOTE — Patient Instructions (Addendum)
ICD-10-CM   1. ILD (interstitial lung disease) ([place]) J84.9   2. Chronic cough R05   3. IPF (idiopathic pulmonary fibrosis) ([place]) J84.112      Glad stable since last visit but as you know progressive over time Cough is a major issue  Plan - start ofev 182m twice daily  - get rid of all down pillows and blanket/duvet - continue prednisone 547mper day  -discussed several options for cough  - continue tessalon perles - talk to insurance to see iif you can get it cheaper  - try inspiratory muscle trainer  - hold off on options of gabapentin, increased steroid dose or cough study - keep follow-up with Dr GiLauris Chromannd feb 2020  = please have him do LFT test if on ofev at that time - return to see me end march 2020  Followup  - end march 2020 or sooner if needed;  ILD clinic

## 2018-03-12 NOTE — Telephone Encounter (Signed)
Sent mychart message to pt with all the symptom questions that MR wanted [place] to ask her and this was her response:  Thanks Lynda Capistran,I do NOT have those symptoms...Marland KitchenMarland KitchenMarland Kitchenperhaps I did not have as restful night as usual.I have really taken it easy today and I feel better now. I think I often do not drink enough water and are somewhat dehydrated..could this contribute to the shortness of breath or fatigue.? My husband does not have a fever.[name]...just feels yucky!

## 2018-03-12 NOTE — Telephone Encounter (Signed)
Is dyspnea worse than when she saw me? Any fever? Any worsening fatigue? Any runny nose? Any sputum? ANy wheeze? Any edema? Any hemoptysis? Does she feel she has a viral like her husband?  Please get these answers

## 2018-03-12 NOTE — Telephone Encounter (Signed)
-----  Message -----  From: [name]  Sent: 03/12/2018 10:00 AM EST  To: Brand Males, MD Subject: Non-Urgent Medical Question  I seem to be having moredifficulty catching my breath.I increased the liters of the concentrator to 4 but it does not seem to make a difference. I am OK doing nothing! Idecreased the prednisone to 5 mg.about 10 days ago.Would it help if I increased the dosage again?  My husband is fighting a cold and cough so don't be surprised if I have to call you! I havemovedhim to the other side of the house!!!, I have received the powerbreathethat you suggested and will try that today. Thanks. [facility]  MR, please advise. Thanks.

## 2018-03-14 DIAGNOSIS — R69 Illness, unspecified: Secondary | ICD-10-CM | POA: Diagnosis not present

## 2018-03-14 DIAGNOSIS — J849 Interstitial pulmonary disease, unspecified: Secondary | ICD-10-CM | POA: Diagnosis not present

## 2018-03-15 ENCOUNTER — Telehealth: Payer: Self-pay | Admitting: Internal Medicine

## 2018-03-15 NOTE — Telephone Encounter (Signed)
Called [facility] and spoke with Isma stating to her that we received a call from [place] that pt's OFEV was needing a PA. Per Isma, she was going to try to transfer me to [place] but she was away from her desk.   Per Isma, they were just checking to see if a PA had been done yet and she stated that they should have faxed the PA request to our office for [place] to do. I stated to Isma that we would do the PA once we received the PA form. Isma expressed understanding. Nothing further needed.

## 2018-03-15 NOTE — Telephone Encounter (Signed)
Ok no need to increase prednisone  Also, did she work out cost of ofev?  Dehdyration can indirectly make her more tired and thereby more dyspneic

## 2018-03-19 ENCOUNTER — Telehealth: Payer: Self-pay | Admitting: Internal Medicine

## 2018-03-19 NOTE — Telephone Encounter (Signed)
PA request received from [facility] Drug requested: Ofev 143m CMM Key: [id number] request has been sent to plan, and a determination is expected within 3-5 days.   Routing to [place].

## 2018-03-19 NOTE — Telephone Encounter (Signed)
Received a fax stating that pt's OFEV has been approved 02/26/2018 through 02/28/2019. Nothing further needed.

## 2018-03-21 DIAGNOSIS — J849 Interstitial pulmonary disease, unspecified: Secondary | ICD-10-CM | POA: Diagnosis not present

## 2018-03-27 NOTE — Telephone Encounter (Signed)
Patient sent this message this morning.   For your information regarding OFEV........I would have to pay approximately $2,700 a month for the med.......or approximately $ 32,000 a year.One foundation would pay approximately $9,000 a year.......Marland Kitchenmeaning I would be responsible for $21,000 a year.That is not realistic for me so at this time, I will just continue as is!  Message routed to Dr. [name]

## 2018-03-28 NOTE — Telephone Encounter (Signed)
Vesta Mixer Sorry this is a problem with our healthcare. Punishing people who have  Had insurance and with income above a median level whereby this copay itself will stress people financially.

## 2018-04-14 DIAGNOSIS — J849 Interstitial pulmonary disease, unspecified: Secondary | ICD-10-CM | POA: Diagnosis not present

## 2018-04-19 DIAGNOSIS — E7800 Pure hypercholesterolemia, unspecified: Secondary | ICD-10-CM | POA: Diagnosis not present

## 2018-04-21 DIAGNOSIS — J849 Interstitial pulmonary disease, unspecified: Secondary | ICD-10-CM | POA: Diagnosis not present

## 2018-04-23 DIAGNOSIS — J849 Interstitial pulmonary disease, unspecified: Secondary | ICD-10-CM | POA: Diagnosis not present

## 2018-04-23 DIAGNOSIS — R0609 Other forms of dyspnea: Secondary | ICD-10-CM | POA: Diagnosis not present

## 2018-05-13 DIAGNOSIS — J849 Interstitial pulmonary disease, unspecified: Secondary | ICD-10-CM | POA: Diagnosis not present

## 2018-05-20 DIAGNOSIS — J849 Interstitial pulmonary disease, unspecified: Secondary | ICD-10-CM | POA: Diagnosis not present

## 2018-06-12 NOTE — Telephone Encounter (Signed)
Please see pts email, she is thanking you for your services during the COVID crisis. Nothing needed.

## 2018-06-13 DIAGNOSIS — J849 Interstitial pulmonary disease, unspecified: Secondary | ICD-10-CM | POA: Diagnosis not present

## 2018-06-20 DIAGNOSIS — J849 Interstitial pulmonary disease, unspecified: Secondary | ICD-10-CM | POA: Diagnosis not present

## 2018-07-13 DIAGNOSIS — J849 Interstitial pulmonary disease, unspecified: Secondary | ICD-10-CM | POA: Diagnosis not present

## 2018-07-20 DIAGNOSIS — J849 Interstitial pulmonary disease, unspecified: Secondary | ICD-10-CM | POA: Diagnosis not present

## 2018-08-13 DIAGNOSIS — J849 Interstitial pulmonary disease, unspecified: Secondary | ICD-10-CM | POA: Diagnosis not present

## 2018-08-20 DIAGNOSIS — J849 Interstitial pulmonary disease, unspecified: Secondary | ICD-10-CM | POA: Diagnosis not present

## 2018-08-28 ENCOUNTER — Telehealth: Payer: Self-pay | Admitting: Internal Medicine

## 2018-08-28 NOTE — Telephone Encounter (Signed)
Thanks

## 2018-09-03 DIAGNOSIS — Z1389 Encounter for screening for other disorder: Secondary | ICD-10-CM | POA: Diagnosis not present

## 2018-09-03 DIAGNOSIS — I2510 Atherosclerotic heart disease of native coronary artery without angina pectoris: Secondary | ICD-10-CM | POA: Diagnosis not present

## 2018-09-03 DIAGNOSIS — M8580 Other specified disorders of bone density and structure, unspecified site: Secondary | ICD-10-CM | POA: Diagnosis not present

## 2018-09-03 DIAGNOSIS — E7800 Pure hypercholesterolemia, unspecified: Secondary | ICD-10-CM | POA: Diagnosis not present

## 2018-09-03 DIAGNOSIS — Z0001 Encounter for general adult medical examination with abnormal findings: Secondary | ICD-10-CM | POA: Diagnosis not present

## 2018-09-03 DIAGNOSIS — J84112 Idiopathic pulmonary fibrosis: Secondary | ICD-10-CM | POA: Diagnosis not present

## 2018-09-04 ENCOUNTER — Other Ambulatory Visit: Payer: Self-pay | Admitting: Internal Medicine

## 2018-09-04 DIAGNOSIS — M8580 Other specified disorders of bone density and structure, unspecified site: Secondary | ICD-10-CM

## 2018-09-04 DIAGNOSIS — Z1231 Encounter for screening mammogram for malignant neoplasm of breast: Secondary | ICD-10-CM

## 2018-09-07 DIAGNOSIS — Z23 Encounter for immunization: Secondary | ICD-10-CM | POA: Diagnosis not present

## 2018-09-12 DIAGNOSIS — J849 Interstitial pulmonary disease, unspecified: Secondary | ICD-10-CM | POA: Diagnosis not present

## 2018-09-19 DIAGNOSIS — J849 Interstitial pulmonary disease, unspecified: Secondary | ICD-10-CM | POA: Diagnosis not present

## 2018-10-13 DIAGNOSIS — J849 Interstitial pulmonary disease, unspecified: Secondary | ICD-10-CM | POA: Diagnosis not present

## 2018-10-20 DIAGNOSIS — J849 Interstitial pulmonary disease, unspecified: Secondary | ICD-10-CM | POA: Diagnosis not present

## 2018-11-05 ENCOUNTER — Other Ambulatory Visit: Payer: Self-pay

## 2018-11-05 ENCOUNTER — Encounter (HOSPITAL_COMMUNITY): Payer: Self-pay

## 2018-11-05 ENCOUNTER — Ambulatory Visit (HOSPITAL_COMMUNITY)
Admission: EM | Admit: 2018-11-05 | Discharge: 2018-11-05 | Disposition: A | Discharge status: Home / Self Care | Payer: Medicare HMO

## 2018-11-05 DIAGNOSIS — S81811A Laceration without foreign body, right lower leg, initial encounter: Secondary | ICD-10-CM | POA: Diagnosis not present

## 2018-11-05 DIAGNOSIS — W228XXA Striking against or struck by other objects, initial encounter: Secondary | ICD-10-CM

## 2018-11-05 DIAGNOSIS — M79661 Pain in right lower leg: Secondary | ICD-10-CM

## 2018-11-05 MED ORDER — LIDOCAINE-EPINEPHRINE-TETRACAINE (LET) SOLUTION
NASAL | Status: AC
  Filled 2018-11-05: qty 3

## 2018-11-05 MED ORDER — LIDOCAINE-EPINEPHRINE (PF) 2 %-1:200000 IJ SOLN
INTRAMUSCULAR | Status: AC
  Filled 2018-11-05: qty 20

## 2018-11-05 NOTE — ED Provider Notes (Signed)
  MRN: [id number] DOB: 29-Aug-1934  Subjective:   [name] is a 84 y.o. female presenting for 1 day history of suffering right lower leg laceration from bumping into a chair.  Patient states that she cleaned her wound with peroxide and has kept it covered.  The wound is approximately 12 hours old.  Her Tdap was updated 2 years ago.  Denies fever, redness, drainage of pus or bleeding.  No current facility-administered medications for this encounter.   Current Outpatient Medications:  .  aspirin 81 MG chewable tablet, Chew 81 mg by mouth daily., Disp: , Rfl:  .  atorvastatin (LIPITOR) 10 MG tablet, Take 10 mg by mouth daily., Disp: , Rfl:  .  benzonatate (TESSALON) 200 MG capsule, Take 1 capsule by mouth as needed., Disp: , Rfl:  .  calcium carbonate (OS-CAL) 600 MG TABS, Take 600 mg by mouth daily., Disp: , Rfl:  .  LINZESS 145 MCG CAPS capsule, , Disp: , Rfl:  .  predniSONE (DELTASONE) 5 MG tablet, Take 5 mg by mouth daily with breakfast., Disp: , Rfl:    Allergies  Allergen Reactions  . Prednisone     Hallucination when given in high doses     Past Medical History:  Diagnosis Date  . Hemorrhoids   . IPF (idiopathic pulmonary fibrosis) ([place])      Past Surgical History:  Procedure Laterality Date  . BREAST EXCISIONAL BIOPSY Left    x 2  . TONSILLECTOMY     age 29    ROS  Objective:   Vitals: BP (!) 148/83 (BP Location: Right Arm)   Pulse 73   Temp (!) 97.5 F (36.4 C) (Oral)   SpO2 98%   Physical Exam Constitutional:      General: She is not in acute distress.    Appearance: Normal appearance. She is well-developed. She is not ill-appearing.  HENT:     Head: Normocephalic and atraumatic.     Nose: Nose normal.     Mouth/Throat:     Mouth: Mucous membranes are moist.     Pharynx: Oropharynx is clear.  Eyes:     General: No scleral icterus.    Extraocular Movements: Extraocular movements intact.     Pupils: Pupils are equal, round, and reactive to light.   Cardiovascular:     Rate and Rhythm: Normal rate.  Pulmonary:     Effort: Pulmonary effort is normal.  Musculoskeletal:       Legs:  Skin:    General: Skin is warm and dry.  Neurological:     General: No focal deficit present.     Mental Status: She is alert and oriented to person, place, and time.  Psychiatric:        Mood and Affect: Mood normal.        Behavior: Behavior normal.     PROCEDURE NOTE: laceration repair Verbal consent obtained from patient.  Local anesthesia with 4cc Lidocaine 2% with epinephrine.  Wound explored for tendon, ligament damage. Wound scrubbed with soap and water and rinsed. Wound closed with #5 4-0 Prolene (simple interrupted) sutures.  Wound cleansed and dressed.   Assessment and Plan :   1. Laceration of right lower extremity, initial encounter   2. Pain of right lower leg     Laceration repaired successfully. Wound care reviewed. Return-to-clinic precautions discussed, patient verbalized understanding. Otherwise, follow up in 10 days for suture removal.     Jaynee Eagles, PA-C 11/05/18 1101

## 2018-11-05 NOTE — Discharge Instructions (Addendum)
For the next 2 days, try to limit your walking so as to not tear your sutures.    WOUND CARE Please return in 10 days to have your stitches/staples removed or sooner if you have concerns.  Keep area clean and dry for 24 hours. Do not remove bandage, if applied.  After 24 hours, remove bandage and wash wound gently with mild soap and warm water. Reapply a new bandage after cleaning wound, if directed.  Continue daily cleansing with soap and water until stitches/staples are removed.  Do not apply any ointments or creams to the wound while stitches/staples are in place, as this may cause delayed healing.  Notify the office if you experience any of the following signs of infection: Swelling, redness, pus drainage, streaking, fever >101.0 F  Notify the office if you experience excessive bleeding that does not stop after 15-20 minutes of constant, firm pressure.

## 2018-11-05 NOTE — ED Triage Notes (Signed)
Pt report she hit a chair last night 11/04/2018 and she injury her right leg.

## 2018-11-13 DIAGNOSIS — J849 Interstitial pulmonary disease, unspecified: Secondary | ICD-10-CM | POA: Diagnosis not present

## 2018-11-15 DIAGNOSIS — Z23 Encounter for immunization: Secondary | ICD-10-CM | POA: Diagnosis not present

## 2018-11-20 DIAGNOSIS — J849 Interstitial pulmonary disease, unspecified: Secondary | ICD-10-CM | POA: Diagnosis not present

## 2018-11-23 ENCOUNTER — Other Ambulatory Visit: Payer: Self-pay

## 2018-11-23 ENCOUNTER — Ambulatory Visit
Admission: RE | Admit: 2018-11-23 | Discharge: 2018-11-23 | Disposition: A | Discharge status: Home / Self Care | Payer: Medicare HMO | Source: Ambulatory Visit | Attending: Internal Medicine | Admitting: Internal Medicine

## 2018-11-23 DIAGNOSIS — Z1231 Encounter for screening mammogram for malignant neoplasm of breast: Secondary | ICD-10-CM | POA: Diagnosis not present

## 2018-11-23 DIAGNOSIS — Z780 Asymptomatic menopausal state: Secondary | ICD-10-CM | POA: Diagnosis not present

## 2018-11-23 DIAGNOSIS — M8580 Other specified disorders of bone density and structure, unspecified site: Secondary | ICD-10-CM

## 2018-11-23 DIAGNOSIS — M8589 Other specified disorders of bone density and structure, multiple sites: Secondary | ICD-10-CM | POA: Diagnosis not present

## 2018-12-10 DIAGNOSIS — Z9981 Dependence on supplemental oxygen: Secondary | ICD-10-CM | POA: Diagnosis not present

## 2018-12-10 DIAGNOSIS — Z87891 Personal history of nicotine dependence: Secondary | ICD-10-CM | POA: Diagnosis not present

## 2018-12-10 DIAGNOSIS — J849 Interstitial pulmonary disease, unspecified: Secondary | ICD-10-CM | POA: Diagnosis not present

## 2018-12-10 DIAGNOSIS — R600 Localized edema: Secondary | ICD-10-CM | POA: Diagnosis not present

## 2018-12-10 DIAGNOSIS — R918 Other nonspecific abnormal finding of lung field: Secondary | ICD-10-CM | POA: Diagnosis not present

## 2018-12-10 DIAGNOSIS — R0600 Dyspnea, unspecified: Secondary | ICD-10-CM | POA: Diagnosis not present

## 2018-12-10 DIAGNOSIS — I071 Rheumatic tricuspid insufficiency: Secondary | ICD-10-CM | POA: Diagnosis not present

## 2018-12-10 DIAGNOSIS — E041 Nontoxic single thyroid nodule: Secondary | ICD-10-CM | POA: Diagnosis not present

## 2018-12-10 DIAGNOSIS — J8410 Pulmonary fibrosis, unspecified: Secondary | ICD-10-CM | POA: Diagnosis not present

## 2018-12-13 DIAGNOSIS — J849 Interstitial pulmonary disease, unspecified: Secondary | ICD-10-CM | POA: Diagnosis not present

## 2018-12-20 DIAGNOSIS — J849 Interstitial pulmonary disease, unspecified: Secondary | ICD-10-CM | POA: Diagnosis not present

## 2018-12-27 DIAGNOSIS — L720 Epidermal cyst: Secondary | ICD-10-CM | POA: Diagnosis not present

## 2018-12-27 DIAGNOSIS — D692 Other nonthrombocytopenic purpura: Secondary | ICD-10-CM | POA: Diagnosis not present

## 2018-12-27 DIAGNOSIS — D485 Neoplasm of uncertain behavior of skin: Secondary | ICD-10-CM | POA: Diagnosis not present

## 2018-12-27 DIAGNOSIS — C44519 Basal cell carcinoma of skin of other part of trunk: Secondary | ICD-10-CM | POA: Diagnosis not present

## 2018-12-27 DIAGNOSIS — L570 Actinic keratosis: Secondary | ICD-10-CM | POA: Diagnosis not present

## 2018-12-27 DIAGNOSIS — D2239 Melanocytic nevi of other parts of face: Secondary | ICD-10-CM | POA: Diagnosis not present

## 2018-12-27 DIAGNOSIS — Z85828 Personal history of other malignant neoplasm of skin: Secondary | ICD-10-CM | POA: Diagnosis not present

## 2018-12-27 DIAGNOSIS — L821 Other seborrheic keratosis: Secondary | ICD-10-CM | POA: Diagnosis not present

## 2018-12-27 DIAGNOSIS — L820 Inflamed seborrheic keratosis: Secondary | ICD-10-CM | POA: Diagnosis not present

## 2018-12-27 DIAGNOSIS — D1801 Hemangioma of skin and subcutaneous tissue: Secondary | ICD-10-CM | POA: Diagnosis not present

## 2019-01-01 DIAGNOSIS — H5213 Myopia, bilateral: Secondary | ICD-10-CM | POA: Diagnosis not present

## 2019-01-01 DIAGNOSIS — H52203 Unspecified astigmatism, bilateral: Secondary | ICD-10-CM | POA: Diagnosis not present

## 2019-01-01 DIAGNOSIS — Z961 Presence of intraocular lens: Secondary | ICD-10-CM | POA: Diagnosis not present

## 2019-01-01 DIAGNOSIS — H524 Presbyopia: Secondary | ICD-10-CM | POA: Diagnosis not present

## 2019-01-10 DIAGNOSIS — Z85828 Personal history of other malignant neoplasm of skin: Secondary | ICD-10-CM | POA: Diagnosis not present

## 2019-01-10 DIAGNOSIS — C44519 Basal cell carcinoma of skin of other part of trunk: Secondary | ICD-10-CM | POA: Diagnosis not present

## 2019-01-13 DIAGNOSIS — J849 Interstitial pulmonary disease, unspecified: Secondary | ICD-10-CM | POA: Diagnosis not present

## 2019-01-15 ENCOUNTER — Telehealth: Payer: Self-pay | Admitting: Internal Medicine

## 2019-01-15 NOTE — Telephone Encounter (Signed)
In care everywhere, you can see a PFT that pt had done in February 2020. Checked to see if there were reports of a recent CT or echo and did not see any of those results. I also do not have any paperwork from [facility] on pt in regards to the recent tests.  Attempted to call pt but unable to reach. Left message for pt to return call.

## 2019-01-15 NOTE — Telephone Encounter (Signed)
Pt returning call and aware that we have not received anything or couldn't see any recent studies from 10/12.  She will call [facility] again to see if they can send.  If it is received, please let patient know.

## 2019-01-17 ENCOUNTER — Encounter: Payer: Self-pay | Admitting: Internal Medicine

## 2019-01-17 ENCOUNTER — Other Ambulatory Visit: Payer: Self-pay

## 2019-01-17 ENCOUNTER — Ambulatory Visit: Payer: Medicare HMO | Admitting: Internal Medicine

## 2019-01-17 ENCOUNTER — Telehealth: Payer: Self-pay | Admitting: Internal Medicine

## 2019-01-17 VITALS — BP 116/64 | HR 85 | Ht 64.0 in | Wt 129.8 lb

## 2019-01-17 DIAGNOSIS — R05 Cough: Secondary | ICD-10-CM | POA: Diagnosis not present

## 2019-01-17 DIAGNOSIS — J849 Interstitial pulmonary disease, unspecified: Secondary | ICD-10-CM | POA: Diagnosis not present

## 2019-01-17 DIAGNOSIS — R053 Chronic cough: Secondary | ICD-10-CM

## 2019-01-17 DIAGNOSIS — J84112 Idiopathic pulmonary fibrosis: Secondary | ICD-10-CM

## 2019-01-17 NOTE — Progress Notes (Signed)
HPI  IOV 03/21/2017  Chief Complaint  Patient presents with   Advice Only    Former RA pt. Pt states she has been going to [facility] and is on 3-4L O2 with exertion and sometimes at night. Has SOB mainly with exertion.    Acacia P Wignall 84 y.o. female with interstitial lung disease.  History is gained from talking to her and review of the chart.  She was originally diagnosed with interstitial lung disease by Dr. Kara Mead in our practice.  Subsequently she transferred her care to [facility] Dr. [name] who has been following her.  Review of the chart shows the differential diagnosis is IPF versus chronic hypersensitivity pneumonitis based on some air trapping done in her high-resolution CT scan of the chest in 2017/2018.  She is known to have chronic stable interstitial lung disease on previous imaging dating as back as 2008 but she is only aware of her interstitial lung disease in the recent few years.  There is excellent documentation in Dr. [name] medical notes.  It appears based on the goals of care of the patient and her exertional desaturation that surgical lung biopsy was deferred and anti-fibrotic therapy was deferred.  Patient confirms the same.  She is extremely physically fit and thus good aerobic activity on 4 L of oxygen outpacing people much younger than her.  It appears over many years has been a gradual decline in her fibrosis and pulmonary health but she still extremely functional and does well.  She is mainly here today to have a pulmonary fibrosis resource in [place], [place].  She sees Dr. Lauris Chroman every 4 months.  She prefers to see Dr. Lauris Chroman and do all her monitoring in terms of her 6-minute walk test, pulmonary function test and her high-resolution CT scan at [facility] because she is very comfortable with the personnel there.  Yet because she is local in [place] she wants to tap into the resources here in case of an emergency.  We  discussed about alternating her follow-up between myself and Dr. Lauris Chroman every 4 months which would make her visit to either one place every 8 months and she seemed to think this was a good plan.  At this point as she is stable and she is satisfied with all pillars of our care.  She is not interested in pulmonary rehabilitation because rightfully so she exercises pretty actively.  She has been the patient support group and did not find value in it.  She is not a transplant candidate.  And she is deferred anti-fibrotic therapy.  She might or might not be interested in research trial and this was not discussed.  It appears the only new issue other than her overall stability is that for the last 1 year she has mild random dysphagia although no active acid reflux.  This can happen for liquids or solids or tablets and is very random.  She has not seen a gastroenterologist for this.  She is not on any proton pump inhibitors.  In addition she tells me she uses of feather down pillow all her life.  She is also use talcum powder all her life and with the recent lawsuit against [employer] about talcum powder she wonders if this could be contributory to her pulmonary fibrosis.  Otherwise [employer] of chest physicians interstitial lung disease questionnaire is positive for cigarette smoking from age 26 when she smoked a pack a week to age 48.  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  She did recently try a course of prednisone through Dr. Lauris Chroman for her interstitial lung disease but she did not seem to benefit from this and this is been slowly weaned off.  Her autoimmune antibody at [facility] and in [place] was normal.  Pulmonary function test from [facility] was reviewed and the images of CT scan of the chest done in [place] visualized  Walking desaturation test on 03/21/2017 185 feet x 3 laps on ROOM AIR:  did not desaturate. Rest pulse ox was 100%, final pulse ox was 88%. HR  response 71/min at rest to 120/min at peak exertion. Patient Rachana P Kuehnel  Did yes Desaturate </= 88% . Lasean P Bisceglia yes  Desaturated </= 3% points. Laurelin P Soller yes did get tachyardic  IMPRESSION: 1. Pulmonary parenchymal pattern of fibrosis is progressive, most indicative of usual interstitial pneumonitis (UIP). 2. Coronary artery calcification.   Electronically Signed   By: Lorin Picket M.D.   On: 10/27/2014 15:51 OV 06/21/2017   Chief Complaint  Patient presents with   Follow-up    Pt saw Dr. Lauris Chroman 2/4.  Pt states she has been doing well up until 10 days ago when she developed a cold and is coughing, postnasal drainage and some mild chest tightness.   Clodagh P Gerbino presents for follow-up of interstitial lung disease [IPF most likely versus chronic HP less likely],. Is supportive care. She follows mostly at [facility] with Dr. [name]. I am her local resource. This is a scheduled appointment. Last seen by Dr. Lauris Chroman in February 2019. Notes reviewed and patient also confirms the same. It appears that without low-dose prednisone her cough relapsed. Therefore she went back on 5 mg prednisone and she was doing well in terms of her cough. However 10 days ago she developed a runny nose and cold and since then has increased cough compared to baseline. She also has a nasal twang. This a slight increase in sputum production compared to baseline and the sputum is off color. This is not normal for her. The might be some increased wheezing but his sleep quality is good. Overall effort tolerance continues to be good. She exercises with oxygen. At room air at rest she does not need oxygen. Most recently in February 2019 her ILD was deemed stable by Dr. Lauris Chroman. [place] she wants treatment recommendation for the current deterioration. She does not want to do high dose prednisone because of fear of hallucinationsthat have happened in the past. She also  now wants to alternate pulmonary fibrosis follow-up between [facility] and myself.   She also last several months c/o loss of taste and asking for directions in resolving this issue      OV 01/23/2018  Subjective:  Patient ID: [name], female , DOB: 01-09-1935 , age 61 y.o. , MRN: [id number] , ADDRESS: [place] [address]   01/23/2018 -   Chief Complaint  Patient presents with   Follow-up    States her breathing has been at her baseline. Denies chest pain or chest discomfort. States tesslon helps but it is too expensive.      HPI [facility] 25 y.[facility]. -returns for follow-up of her interstitial lung disease that has a differential diagnosis between IPF and chronic hypersensitivity pneumonitis.  After seeing me last she saw Dr. Lauris Chroman in August 2019.  Since then she has been  doing stable.  I reviewed those notes.  Compared to recent visits her CT scan is stable and her walk test is stable.  She is able to manage with 2 L oxygen.  She has a portable system.  She does feel that she is unable to do more than she would like to do because of her ILD.  She does admit that compared to 3 years ago she says progressive ILD.  She also has significant cough for which she likes Ladona Ridgel but she says insurance is making him pay $3 a pill and so she is upset about that.  Review of immunization record shows that she has not had Pneumovax or flu shot this year but she says she is up-to-date.  We discussed the new [employer] trial for progressive non-IPF ILD where there was beneficial effect of nintedanib.  We discussed this and she is willing to try this after the new year 2020.  She wants to make sure that her [place] ILD specialist Dr. Lauris Chroman is on board with this.  We discussed the side effects in detail.    OV 03/08/2018  Subjective:  Patient ID: [name], female , DOB: 08/03/1934 , age 45 y.o. , MRN: [id number] , ADDRESS: [place]  [id number]   03/08/2018 -   Chief Complaint  Patient presents with   Follow-up    Pt states she has been well since last visit. States she does become SOB with exertion and does have an occ cough with clear mucus. Denies any CP.    ilD FOLLOWUP   HPI [facility] 84 y.o. -returns for followup. Over xmas 2019 because of social stress increased prednisone to 3m per day but now back to 534mper day. Dyspnea with exergtion continues but stable. Cough that is very bothersome continue.  She takes [employer] boyor this this helps her.  However it is expensive.  She is placed a call to [employer] apparently they are going to try to work out where she can make it more affordable because I ILD is a diagnosis.  She admitted that she has down pillows and feather blankets.  1 of her close friends Mr. TaLovena Leho also has hypersensitivity pneumonitis and sees me advised her to get rid of those and therefore she is in the process of doing that.  She asked about further options and treatment of a cough.  We discussed gabapentin no increasing steroid dose participating in a cough study but she did not want to add these complications to her.  She seems content with the option of trying an inspiratory muscle trainer and seeing if the cough would get better.  Overall given her progressive ILD and the possibility that this is IPF she is willing to now try nintedanib.  We discussed nintedanib extensively.  She denies any heart disease.  She is not on any major anticoagulation.  She does not have any GI issues other than constipation.  She is willing to try nintedanib.  She is nervous about the co-pay and said if the co-pay is too expensive despite charity program then she will not take it.  She [facility].          OV 01/17/2019  Subjective:  Patient ID: HoMammie Lorenzofemale , DOB: 06/07/03/1936 age 84.o. , MRN: [id number] [address] [id number]  01/17/2019 -   Chief Complaint  Patient presents with   Follow-up    Pt states she has been okay since last visit. Pt states SOB has become worse and is coughing a lot.   Interstitial lung disease-IPF [UIP in August 2016 CT scan read locally] versus second distant possibility chronic hypersensitivity pneumonitis [feather pillow exposure and air trapping on CT scan per report]   HPI [facility] 84 y.o. -last seen January 2020.  Since then in the last 6 months he has declined significantly.  She did see Dr. Lauris Chroman about a month ago at [facility].  I reviewed the chart.  Her shortness of breath with exertion has declined.  With exercise and walking she is using 4 L of oxygen.  Her cough also got worse.  He has increased the prednisone to 10 mg/day.  This is helped her cough.  Nevertheless she randomly gets severe coughing spells that makes her feel that she is going to die.  She is got a facemask oxygen is going to use that.  There was conversation about increasing the prednisone even further according to her history but she declined.  At this point in time she just wants to monitor the situation.  She certainly is open to taking more prednisone if the cough got worse.  She did have a high-resolution CT scan of the chest December 10, 2018 at [facility].  I reviewed the result.  Images not available for visualization.  They report definite persistent air trapping.  They also report some nodular opacities in the left lung.  Overall worsening fibrosis especially on the left greater than the right.  She did have an echocardiogram.  Her symptom scores currently are listed below and it shows significant level of symptoms.  We discussed antifibrotic nintedanib.  She says she is so frustrated because of the cost.  She says she tried a level best to get the cost down but could not.  She is pretty pessimistic that she will not be able to get a discount on the drug.   We discussed about the fact her disease continues to be progressive and to see if the cost would justify some of the benefit it might give.  She is willing to have me inquire about the cost.  We discussed research as a care option.  We discussed in detail the concept of research being voluntary.  The principles of randomization to placebo.  The need to do study visits.  Inclusion exclusion criteria.  She is willing to go through screening for any protocol for which she might qualify.  This will be possible after January 2020.  She had questions about the Covid vaccine.  She is social distancing.  She is afraid of getting Covid.  SYMPTOM SCALE - ILD 01/17/2019   O2 use 4L with exercise  Shortness of Breath 0 -> 5 scale with 5 being worst (score 6 If unable to do)  At rest 2  Simple tasks - showers, clothes change, eating, shaving 2  Household (dishes, doing bed, laundry) 4  Shopping 3  Walking level at own pace 3  Walking keeping up with others of same age 20  Walking up Stairs 5  Walking up Hill 5  Total (40 - 48) Dyspnea Score 29  How bad is your cough? 4  How bad is your fatigue 2        Results for ENEDELIA, MARTORELLI (MRN [id number]) as of 01/23/2018 17:05  Ref. Range 10/28/2014  16:39 10/03/2017 - [facility] 12/10/2018 [facility]  FVC-Pre Latest Units: L 2.19 1.85 1.64  FVC-%Pred-Pre Latest Units: % 84 69% 66   Results for NAYALI, TALERICO (MRN [id number]) as of 01/23/2018 17:05  Ref. Range 10/28/2014 16:39 01/23/2018  12/10/2018  DLCO unc Latest Units: ml/min/mmHg 12.16 8.36 5.68  DLCO unc % pred Latest Units: % 52 51% 30    Simple office walk 250 feet x  3 laps goal with forehead probe 03/08/2018   O2 used Room air  Number laps completed 3  Comments about pace brisk  Resting Pulse Ox/HR 99% and 85/min  Final Pulse Ox/HR 90% and 124/min  Desaturated </= 88% no  Desaturated <= 3% points yes  Got Tachycardic >/= 90/min yes  Symptoms at end of test Moderate dyspnea  Miscellaneous comments x    ROS - per HPI     has a past medical history of Hemorrhoids and IPF (idiopathic pulmonary fibrosis) ([place]).   reports that she quit smoking about 31 years ago. Her smoking use included cigarettes. She has a 7.50 pack-year smoking history. She has never used smokeless tobacco.  Past Surgical History:  Procedure Laterality Date   BREAST EXCISIONAL BIOPSY Left    x 2   TONSILLECTOMY     age 30    Allergies  Allergen Reactions   Prednisone     Hallucination when given in high doses     Immunization History  Administered Date(s) Administered   Influenza, High Dose Seasonal PF 10/29/2016, 10/30/2018   Influenza,inj,Quad PF,6+ Mos 11/05/2014   Influenza-Unspecified 11/28/2013   Pneumococcal Polysaccharide-23 02/10/2018   Zoster Recombinat (Shingrix) 09/22/2016, 12/22/2016    Family History  Problem Relation Age of Onset   Stroke Father    Lung cancer Brother        mets     Current Outpatient Medications:    aspirin 81 MG chewable tablet, Chew 81 mg by mouth daily., Disp: , Rfl:    atorvastatin (LIPITOR) 10 MG tablet, Take 10 mg by mouth daily., Disp: , Rfl:    benzonatate (TESSALON) 200 MG capsule, Take 1 capsule by mouth as needed., Disp: , Rfl:    calcium carbonate (OS-CAL) 600 MG TABS, Take 600 mg by mouth daily., Disp: , Rfl:    LINZESS 145 MCG CAPS capsule, , Disp: , Rfl:    predniSONE (DELTASONE) 10 MG tablet, PLEASE SEE ATTACHED FOR DETAILED DIRECTIONS, Disp: , Rfl:       Objective:   Vitals:   01/17/19 1152  BP: 116/64  Pulse: 85  SpO2: 96%  Weight: 129 lb 12.8 oz (58.9 kg)  Height: 5' 4" (1.626 m)    Estimated body mass index is 22.28 kg/m as calculated from the following:   Height as of this encounter: 5' 4" (1.626 m).   Weight as of this encounter: 129 lb 12.8 oz (58.9 kg).  _0 @  Filed Weights   01/17/19 1152  Weight: 129 lb 12.8 oz (58.9 kg)     Physical Exam  General Appearance:    Alert, cooperative, no  distress, appears stated age - yes , Deconditioned looking - no , OBESE  - no, Sitting on Wheelchair -  no  Head:    Normocephalic, without obvious abnormality, atraumatic  Eyes:    PERRL, conjunctiva/corneas clear,  Ears:    Normal TM's and external ear canals, both ears  Nose:   Nares normal, septum midline, mucosa normal, no drainage    or sinus tenderness. OXYGEN ON  -  no . Patient is @ ra   Throat:   Lips, mucosa, and tongue normal; teeth and gums normal. Cyanosis on lips - no  Neck:   Supple, symmetrical, trachea midline, no adenopathy;    thyroid:  no enlargement/tenderness/nodules; no carotid   bruit or JVD  Back:     Symmetric, no curvature, ROM normal, no CVA tenderness  Lungs:     Distress - no , Wheeze no, Barrell Chest - no, Purse lip breathing - no, Crackles - yes   Chest Wall:    No tenderness or deformity.    Heart:    Regular rate and rhythm, S1 and S2 normal, no rub   or gallop, Murmur - no  Breast Exam:    NOT DONE  Abdomen:     Soft, non-tender, bowel sounds active all four quadrants,    no masses, no organomegaly. Visceral obesity - no  Genitalia:   NOT DONE  Rectal:   NOT DONE  Extremities:   Extremities - normal, Has Cane - no, Clubbing - no, Edema - no  Pulses:   2+ and symmetric all extremities  Skin:   Stigmata of Connective Tissue Disease - no  Lymph nodes:   Cervical, supraclavicular, and axillary nodes normal  Psychiatric:  Neurologic:   Pleasant - yes, Anxious - no, Flat affect - no  CAm-ICU - neg, Alert and Oriented x 3 - yes, Moves all 4s - yes, Speech - normal, Cognition - intact           Assessment:       ICD-10-CM   1. ILD (interstitial lung disease) ([place])  J84.9   2. IPF (idiopathic pulmonary fibrosis) ([place])  J84.112   3. Chronic cough  R05        Plan:     Patient Instructions     ICD-10-CM   1. ILD (interstitial lung disease) ([place])  J84.9   2. IPF (idiopathic pulmonary fibrosis) ([place])  J84.112   3. Chronic cough  R05      Progressive disease causing more symptoms and o2 need with exertion Glad prednisone 26m per day (higher dose) Is helping you  Plan - if covid vaccine is safe recommend it  - continue o2 with exertion  =- continue prednisone 144mper day for cough and if cough gets worse we can increase it  - will call [employer] rep and see if we can get ofev at lower price - will see if you can qulify for IPF research trial in jan 2020  Followup  - spirometry and dlco in Jan 2021/feb 2021 - return to ILD clinic in Jan 2021/feb 2021 after PFT  = we can discuss study qualification at followup    > 50% of this > 40 min visit spent in face to face counseling or/and coordination of care - by this undersigned MD - Dr MuBrand MalesThis includes one or more of the following documented above: discussion of test results, diagnostic or treatment recommendations, prognosis, risks and benefits of management options, instructions, education, compliance or risk-factor reduction    SIGNATURE    Dr. MuBrand MalesM.D., F.C.C.P,  Pulmonary and Critical Care Medicine Staff Physician, [place] - Interstitial Lung Disease  Program  Pulmonary [id number]: [phone] no answer or between  15:00h - 7:00h: call [phone] Telephone: [phone]  12:34 PM 01/17/2019

## 2019-01-17 NOTE — Patient Instructions (Addendum)
ICD-10-CM   1. ILD (interstitial lung disease) ([place])  J84.9   2. IPF (idiopathic pulmonary fibrosis) ([place])  J84.112   3. Chronic cough  R05     Progressive disease causing more symptoms and o2 need with exertion Glad prednisone 50m per day (higher dose) Is helping you  Plan - if covid vaccine is safe recommend it  - continue o2 with exertion  =- continue prednisone 138mper day for cough and if cough gets worse we can increase it  - will call BI rep and see if we can get ofev at lower price - will see if you can qulify for IPF research trial in jan 2020  Followup  - spirometry and dlco in Jan 2021/feb 2021 - return to ILD clinic in Jan 2021/feb 2021 after PFT  = we can discuss study qualification at followup

## 2019-01-17 NOTE — Telephone Encounter (Signed)
[name]  Can you please call [name] or better [name] from [facility] and see if the cost of ofev can be worked down for [employer]   Thanks    SIGNATURE    Dr. Brand Males, M.D., F.C.C.P,  Pulmonary and Critical Care Medicine Staff Physician, [place] Director - Interstitial Lung Disease  Program  Pulmonary [place] at [place], [place], [address]  Pager: [phone], If no answer or between  15:00h - 7:00h: call [phone] Telephone: [phone]  6:54 PM 01/17/2019

## 2019-01-18 NOTE — Telephone Encounter (Signed)
I am going to send this to our pharmacy staff to see if they can possibly help [place] out with this.  Please advise. Thanks!

## 2019-01-18 NOTE — Telephone Encounter (Signed)
Called [employer] and no application on file.  Looking through her chart, [facility]  [facility] talked [facility] about [facility] and patient stated she would not qualify.  Prescription has been on hold since February.  Patient not eligible to use co-pay card as she has Medicare.  She may qualify for patient assistance. Looking at the chart it appears she has not applied thinking she may not qualify.  Reached out to [employer] to determine income requirements.  Will reach out to patient to discuss when we hear back.   Mariella Saa, PharmD, [place], [place] Clinical Specialty Pharmacist [phone]  01/18/2019 4:21 PM

## 2019-01-20 DIAGNOSIS — J849 Interstitial pulmonary disease, unspecified: Secondary | ICD-10-CM | POA: Diagnosis not present

## 2019-01-21 DIAGNOSIS — R69 Illness, unspecified: Secondary | ICD-10-CM | POA: Diagnosis not present

## 2019-01-21 NOTE — Telephone Encounter (Signed)
Left message for patient to call back. Will discuss [employer] [place] patient assistance guidelines: [facility] Cares program has a cutoff of 400% of the federal poverty line for [place]. Household size/ annual income: #1/$51,040- #2/ [id number]- #3/ [phone]. Will attempt to call back if no return call.  11:51 AM Beatriz Chancellor, CPhT

## 2019-01-21 NOTE — Telephone Encounter (Signed)
Thanks, Could you please look next into pirfenidone and price it for her?  Thanks  MR

## 2019-01-21 NOTE — Telephone Encounter (Signed)
Submitted a Prior Authorization request to [facility] for [place] via Cover My Meds. Will update once we receive a response.  PA# [id number]  2:35 PM Beatriz Chancellor, CPhT

## 2019-01-21 NOTE — Telephone Encounter (Signed)
Patient returned call. Advised patient of the [place] Patient Assistance program and guidelines. Patient's annual income is over them . Patient also stated she looked into the available grant programs and her annual income is also over their guidelines. Advised patient we will reach out to drug rep to see if we have any other options for her.  1:42 PM Beatriz Chancellor, CPhT

## 2019-01-28 NOTE — Telephone Encounter (Signed)
Received notification from [facility] regarding a prior authorization for [place]. Authorization has been APPROVED from 02/28/2018 to 02/28/19.   Will send document to scan center.  Authorization # [id number]  Patient's copay is [id number] for 1 month supply. Patient is in catastrophic coverage gap.  8:34 AM Beatriz Chancellor, CPhT

## 2019-01-30 NOTE — Telephone Encounter (Signed)
Put patient in contact with Marcheta Grammes the ILD educator at [facility].  Helene Kelp was able to speak with patient and address her questions and concerns.  The patient is going to contact [employer] to see if she can find a plan with better coverage. Helene Kelp advised patient to contact office to discuss moving forward with Ofev after speaking with [employer].    Will follow up with patient on Friday.   Mariella Saa, PharmD, [place], [place] Clinical Specialty Pharmacist [phone]  01/30/2019 4:38 PM

## 2019-02-01 NOTE — Telephone Encounter (Signed)
Thanks and do I need to anything now?

## 2019-02-01 NOTE — Telephone Encounter (Signed)
Received call from Mr. And Mrs. Freda Munro.  They spoke to [facility] representative Ace Gins.  He advised that their current [employer] plan is comprable to the other plans on the market. He advised staying with their current plan.  He estimated the yearly cost of the medication to be $ 9,100 a year (first fill $2,800 then $577 a month).  They were also given an application for [employer] Ofev Patient Assistance.  Discussed the program and income requirements.  Although they are over the maximum to qualify discussed sending in a financial hardship/compassionate letter to better reflect current situation.   Patient will complete application.  Will reach out to [place] with suggestions on what to include in compassionate letter and touch base with patient on Tuesday.  Once application and letter are complete, patient will drop of documents to the office.  Our office will then complete the provider portion and fax.  Patient does not want to start medication until after the new year.  Will continue to work with patient to decrease cost of Ofev.    All questions encouraged and answered.  Instructed patient to call with any other questions or concerns.  Mariella Saa, PharmD, [place], [place] Clinical Specialty Pharmacist [phone]  02/01/2019 10:43 AM

## 2019-02-05 NOTE — Telephone Encounter (Signed)
Called to discuss letter of financial hardship.  Email sent to husband at [email or web address] an example hardship letter as a guide.  Also directed husband to [employer] which has additional resource documents.  He verbalized understanding.  He is currently gathering their income documents and completing [place] application.  He will drop off application, supporting income documents, and hardship letter when completed.  All questions encouraged and answered.  Instructed husband and/or patient to call with any other questions or concerns.   Mariella Saa, PharmD, [place], [place] Clinical Specialty Pharmacist [phone]  02/05/2019 3:04 PM

## 2019-02-12 DIAGNOSIS — J849 Interstitial pulmonary disease, unspecified: Secondary | ICD-10-CM | POA: Diagnosis not present

## 2019-02-19 DIAGNOSIS — J849 Interstitial pulmonary disease, unspecified: Secondary | ICD-10-CM | POA: Diagnosis not present

## 2019-02-27 DIAGNOSIS — S81801A Unspecified open wound, right lower leg, initial encounter: Secondary | ICD-10-CM | POA: Diagnosis not present

## 2019-03-04 ENCOUNTER — Telehealth: Payer: Self-pay | Admitting: Pharmacist

## 2019-03-04 NOTE — Telephone Encounter (Signed)
PA for Ofev expired 02/28/20.  Submitted a Prior Authorization request to [facility] for Ofev via Cover My Meds. Will update once we receive a response.   Mariella Saa, PharmD, [place], [place] Clinical Specialty Pharmacist [phone]  03/04/2019 8:43 AM

## 2019-03-04 NOTE — Telephone Encounter (Signed)
Received notification from [facility] regarding a prior authorization for Ofev. Authorization has been APPROVED from 03/01/19 to 02/28/20.  Will send document to scan center.   PA Case ID: [id number] Key: Aggie Moats, PharmD, [place], [place] Clinical Specialty Pharmacist [phone]  03/04/2019 8:45 AM

## 2019-03-11 ENCOUNTER — Telehealth: Payer: Self-pay | Admitting: Pharmacist

## 2019-03-11 ENCOUNTER — Ambulatory Visit: Payer: Medicare Other | Attending: Internal Medicine

## 2019-03-11 DIAGNOSIS — Z23 Encounter for immunization: Secondary | ICD-10-CM | POA: Insufficient documentation

## 2019-03-11 NOTE — Progress Notes (Signed)
   Covid-19 Vaccination Clinic  Name:  CALAH GERSHMAN    MRN: [id number] DOB: 1934-10-04  03/11/2019  Ms. Kozuch was observed post Covid-19 immunization for 15 minutes without incidence. She was provided with Vaccine Information Sheet and instruction to access the V-Safe system.   Ms. Emanuele was instructed to call 911 with any severe reactions post vaccine: [name] Difficulty breathing  . Swelling of your face and throat  . A fast heartbeat  . A bad rash all over your body  . Dizziness and weakness    Immunizations Administered    Name Date Dose VIS Date Route   Pfizer COVID-19 Vaccine 03/11/2019 10:58 AM 0.3 mL 02/08/2019 Intramuscular   Manufacturer: [place]   Lot: [id number]   [place]: [id number]

## 2019-03-11 NOTE — Telephone Encounter (Signed)
Received e-mail from husband, Reggie,  That they had been notified of patient assistance approval and are in the process of setting up first shipment of Ofev.   Patient has appointment for PFT's on 04/15/2019 and a follow up appointment with Dr. [name] 04/22/2019.  Advised that we typically schedule a 1 month follow up with pharmacy team but not necessary since she has the 04/22/2019.  Advised that she will need lab work at one of the follow up appointments for medication monitoring.  Mariella Saa, PharmD, [place], [place] Clinical Specialty Pharmacist [phone]  03/11/2019 3:25 PM

## 2019-03-11 NOTE — Telephone Encounter (Signed)
Sounds good.   THanks    SIGNATURE    Dr. Brand Males, M.D., F.C.C.P,  Pulmonary and Critical Care Medicine Staff Physician, [place] Director - Interstitial Lung Disease  Program  Pulmonary [place] at [place], [place], [address]  Pager: [phone], If no answer or between  15:00h - 7:00h: call [phone] Telephone: [phone]  4:40 PM 03/11/2019

## 2019-03-15 DIAGNOSIS — J849 Interstitial pulmonary disease, unspecified: Secondary | ICD-10-CM | POA: Diagnosis not present

## 2019-03-20 DIAGNOSIS — H0015 Chalazion left lower eyelid: Secondary | ICD-10-CM | POA: Diagnosis not present

## 2019-03-22 DIAGNOSIS — J849 Interstitial pulmonary disease, unspecified: Secondary | ICD-10-CM | POA: Diagnosis not present

## 2019-03-25 ENCOUNTER — Ambulatory Visit: Payer: Medicare HMO

## 2019-03-25 DIAGNOSIS — Z5181 Encounter for therapeutic drug level monitoring: Secondary | ICD-10-CM

## 2019-03-25 NOTE — Telephone Encounter (Signed)
Lab order has been placed.

## 2019-03-25 NOTE — Telephone Encounter (Signed)
Received the following message from patient:  "I received my first prescription of OFEV today, 25th .    Do you need to do liver tests before I begin taking the medication?  If so, please advise as to where and when I should get the blood tests for AST,ALT AND BILLIRUBIN as suggested in the literature.   Thank you.   [facility]"  MR, please advise. Thanks!

## 2019-03-25 NOTE — Telephone Encounter (Signed)
Will sign off. She is coming for LFT check and then going to start. Looks like med got shipped to her. Thanks for your work  Reply not needed

## 2019-03-25 NOTE — Telephone Encounter (Signed)
Yes please do LFT - > we did noit check till now given the uncertainty with delivery and insurance etc.,   She can drop in any time and get it done

## 2019-03-26 ENCOUNTER — Ambulatory Visit: Payer: Medicare HMO

## 2019-03-27 ENCOUNTER — Other Ambulatory Visit (INDEPENDENT_AMBULATORY_CARE_PROVIDER_SITE_OTHER): Payer: Medicare HMO

## 2019-03-27 DIAGNOSIS — Z5181 Encounter for therapeutic drug level monitoring: Secondary | ICD-10-CM | POA: Diagnosis not present

## 2019-03-27 LAB — HEPATIC FUNCTION PANEL
ALT: 21 U/L (ref 0–35)
AST: 26 U/L (ref 0–37)
Albumin: 4.2 g/dL (ref 3.5–5.2)
Alkaline Phosphatase: 67 U/L (ref 39–117)
Bilirubin, Direct: 0.1 mg/dL (ref 0.0–0.3)
Total Bilirubin: 0.7 mg/dL (ref 0.2–1.2)
Total Protein: 7.3 g/dL (ref 6.0–8.3)

## 2019-03-28 NOTE — Telephone Encounter (Signed)
lft normal and she can start ofev

## 2019-03-30 ENCOUNTER — Ambulatory Visit: Payer: Medicare HMO | Attending: Internal Medicine

## 2019-03-30 DIAGNOSIS — Z23 Encounter for immunization: Secondary | ICD-10-CM | POA: Insufficient documentation

## 2019-03-30 NOTE — Progress Notes (Signed)
Covid-19 Vaccination Clinic  Name:  [name]    MRN: [id number] DOB: 1934-04-15  03/30/2019  [name] was observed post Covid-19 immunization for 15 minutes without incidence. She was provided with Vaccine Information Sheet and instruction to access the V-Safe system.   [name] was instructed to call 911 with any severe reactions post vaccine: [name] Difficulty breathing  . Swelling of your face and throat  . A fast heartbeat  . A bad rash all over your body  . Dizziness and weakness    Immunizations Administered    Name Date Dose VIS Date Route   Pfizer COVID-19 Vaccine 03/30/2019  2:34 PM 0.3 mL 02/08/2019 Intramuscular   Manufacturer: [place]   Lot: [id number]   [place]: [id number]

## 2019-04-04 DIAGNOSIS — R69 Illness, unspecified: Secondary | ICD-10-CM | POA: Diagnosis not present

## 2019-04-08 DIAGNOSIS — S81801A Unspecified open wound, right lower leg, initial encounter: Secondary | ICD-10-CM | POA: Diagnosis not present

## 2019-04-15 DIAGNOSIS — J849 Interstitial pulmonary disease, unspecified: Secondary | ICD-10-CM | POA: Diagnosis not present

## 2019-04-19 ENCOUNTER — Other Ambulatory Visit: Payer: Self-pay

## 2019-04-19 ENCOUNTER — Encounter (HOSPITAL_BASED_OUTPATIENT_CLINIC_OR_DEPARTMENT_OTHER): Payer: Medicare HMO | Attending: Physician Assistant | Admitting: Internal Medicine

## 2019-04-19 DIAGNOSIS — I87311 Chronic venous hypertension (idiopathic) with ulcer of right lower extremity: Secondary | ICD-10-CM | POA: Diagnosis not present

## 2019-04-19 DIAGNOSIS — X58XXXD Exposure to other specified factors, subsequent encounter: Secondary | ICD-10-CM | POA: Diagnosis not present

## 2019-04-19 DIAGNOSIS — Z85828 Personal history of other malignant neoplasm of skin: Secondary | ICD-10-CM | POA: Insufficient documentation

## 2019-04-19 DIAGNOSIS — S81811D Laceration without foreign body, right lower leg, subsequent encounter: Secondary | ICD-10-CM | POA: Insufficient documentation

## 2019-04-19 DIAGNOSIS — Z7952 Long term (current) use of systemic steroids: Secondary | ICD-10-CM | POA: Diagnosis not present

## 2019-04-19 DIAGNOSIS — I872 Venous insufficiency (chronic) (peripheral): Secondary | ICD-10-CM | POA: Diagnosis not present

## 2019-04-19 DIAGNOSIS — L97812 Non-pressure chronic ulcer of other part of right lower leg with fat layer exposed: Secondary | ICD-10-CM | POA: Insufficient documentation

## 2019-04-19 DIAGNOSIS — E785 Hyperlipidemia, unspecified: Secondary | ICD-10-CM | POA: Diagnosis not present

## 2019-04-19 DIAGNOSIS — J849 Interstitial pulmonary disease, unspecified: Secondary | ICD-10-CM | POA: Insufficient documentation

## 2019-04-19 DIAGNOSIS — I10 Essential (primary) hypertension: Secondary | ICD-10-CM | POA: Diagnosis not present

## 2019-04-19 DIAGNOSIS — Z92241 Personal history of systemic steroid therapy: Secondary | ICD-10-CM | POA: Insufficient documentation

## 2019-04-19 NOTE — Progress Notes (Signed)
[name], [name] ([id number]) Visit Report for 04/19/2019 Abuse/Suicide Risk Screen Details Patient Name: Date of Service: [name], [name] 04/19/2019 10:30 AM Medical Record [id number] Patient Account Number: [id number] Date of Birth/Sex: Treating RN: Dec 31, 1934 (85 y.o. Female) Baruch Gouty Primary Care Roselina Burgueno: [name] Other Clinician: Referring Auburn Hester: Treating Araceli Arango/Extender:Robson, Vista Lawman, Moses Manners in Treatment: 0 Abuse/Suicide Risk Screen Items Answer ABUSE RISK SCREEN: Has anyone close to you tried to hurt or harm you recentlyo No Do you feel uncomfortable with anyone in your familyo No Has anyone forced you do things that you didnt want to doo No Electronic Signature(s) Signed: 04/19/2019 6:14:01 PM By: Baruch Gouty RN, BSN Entered By: Baruch Gouty on 04/19/2019 11:05:32 -------------------------------------------------------------------------------- Activities of Daily Living Details Patient Name: Date of Service: [name], [name] 04/19/2019 10:30 AM Medical Record [id number] Patient Account Number: [id number] Date of Birth/Sex: Treating RN: 1934/05/02 (85 y.o. Female) Baruch Gouty Primary Care Halley Shepheard: [name] Other Clinician: Referring Shyne Lehrke: Treating Romelia Bromell/Extender:Robson, Vista Lawman, Moses Manners in Treatment: 0 Activities of Daily Living Items Answer Activities of Daily Living (Please select one for each item) Drive Automobile Completely Able Take Medications Completely Able Use Telephone Completely Able Care for Appearance Completely Able Use Toilet Completely Able Bath / Shower Completely Able Dress Self Completely Able Feed Self Completely Able Walk Completely Able Get In / Out Bed Completely Able Housework Completely Able Prepare Meals Completely Able Handle Money Completely Able Shop for Self Completely Able Electronic Signature(s) Signed: 04/19/2019 6:14:01 PM By: Baruch Gouty RN,  BSN Entered By: Baruch Gouty on 04/19/2019 11:05:51 -------------------------------------------------------------------------------- Education Screening Details Patient Name: Date of Service: Terry Lorenzo. 04/19/2019 10:30 AM Medical Record [id number] Patient Account Number: [id number] Date of Birth/Sex: Treating RN: 12-18-1934 (85 y.o. Female) Baruch Gouty Primary Care Edithe Dobbin: [name] Other Clinician: Referring Cowen Pesqueira: Treating Shinika Estelle/Extender:Robson, Vista Lawman, Moses Manners in Treatment: 0 Primary Learner Assessed: Patient Learning Preferences/Education Level/Primary Language Learning Preference: Explanation, Demonstration, Printed Material Highest Education Level: College or Above Preferred Language: English Cognitive Barrier Language Barrier: No Translator Needed: No Memory Deficit: No Emotional Barrier: No Cultural/Religious Beliefs Affecting Medical Care: No Physical Barrier Impaired Vision: Yes Glasses Impaired Hearing: No Decreased Hand dexterity: No Knowledge/Comprehension Knowledge Level: High Comprehension Level: High Ability to understand written High instructions: Ability to understand verbal High instructions: Motivation Anxiety Level: Calm Cooperation: Cooperative Education Importance: Acknowledges Need Interest in Health Problems: Asks Questions Perception: Coherent Willingness to Engage in Self- High Management Activities: Readiness to Engage in Self- High Management Activities: Electronic Signature(s) Signed: 04/19/2019 6:14:01 PM By: Baruch Gouty RN, BSN Entered By: Baruch Gouty on 04/19/2019 11:06:16 -------------------------------------------------------------------------------- Fall Risk Assessment Details Patient Name: Date of Service: Terry Lorenzo. 04/19/2019 10:30 AM Medical Record [id number] Patient Account Number: [id number] Date of Birth/Sex: Treating RN: 1934-11-07 (85 y.o. Female)  Baruch Gouty Primary Care Vaani Morren: [name] Other Clinician: Referring Monette Omara: Treating Kody Vigil/Extender:Robson, Vista Lawman, Moses Manners in Treatment: 0 Fall Risk Assessment Items Have you had 2 or more falls in the last 12 monthso 0 No Have you had any fall that resulted in injury in the last 12 monthso 0 No FALLS RISK SCREEN History of falling - immediate or within 3 months 0 No Secondary diagnosis (Do you have 2 or more medical diagnoseso) 0 No Ambulatory aid None/bed rest/wheelchair/nurse 0 Yes Crutches/cane/walker 0 No Furniture 0 No Intravenous therapy Access/Saline/Heparin Lock 0 No Weak (short steps with or without shuffle, stooped but able to lift head 0 No while walking, may seek support from furniture)  Impaired (short steps with shuffle, may have difficulty arising from chair, 0 No head down, impaired balance) Mental Status Oriented to own ability 0 Yes Overestimates or forgets limitations 0 No Risk Level: Low Risk Score: 0 Electronic Signature(s) Signed: 04/19/2019 6:14:01 PM By: Baruch Gouty RN, BSN Entered By: Baruch Gouty on 04/19/2019 11:06:44 -------------------------------------------------------------------------------- Foot Assessment Details Patient Name: Date of Service: Terry Lorenzo. 04/19/2019 10:30 AM Medical Record [id number] Patient Account Number: [id number] Date of Birth/Sex: Treating RN: Sep 30, 1934 (85 y.o. Female) Baruch Gouty Primary Care Santita Hunsberger: [name] Other Clinician: Referring Tayonna Bacha: Treating Jakylan Ron/Extender:Robson, Vista Lawman, Moses Manners in Treatment: 0 Foot Assessment Items Site Locations + = Sensation present, - = Sensation absent, C = Callus, U = Ulcer R = Redness, W = Warmth, M = Maceration, PU = Pre-ulcerative lesion F = Fissure, S = Swelling, D = Dryness Assessment Right: Left: Other Deformity: No No Prior Foot Ulcer: No No Prior Amputation: No No Charcot Joint: No  No Ambulatory Status: Ambulatory Without Help Gait: Steady Electronic Signature(s) Signed: 04/19/2019 6:14:01 PM By: Baruch Gouty RN, BSN Entered By: Baruch Gouty on 04/19/2019 11:09:31 -------------------------------------------------------------------------------- Nutrition Risk Screening Details Patient Name: Date of Service: [name], [name] 04/19/2019 10:30 AM Medical Record [id number] Patient Account Number: [id number] Date of Birth/Sex: Treating RN: February 09, 1935 (85 y.o. Female) Baruch Gouty Primary Care Ezreal Turay: [name] Other Clinician: Referring Thena Devora: Treating Avin Upperman/Extender:Robson, Vista Lawman, Moses Manners in Treatment: 0 Height (in): 64 Weight (lbs): 125 Body Mass Index (BMI): 21.5 Nutrition Risk Screening Items Score Screening NUTRITION RISK SCREEN: I have an illness or condition that made me change the kind and/or 0 No amount of food I eat I eat fewer than two meals per day 0 No I eat few fruits and vegetables, or milk products 0 No I have three or more drinks of beer, liquor or wine almost every day 0 No I have tooth or mouth problems that make it hard for me to eat 0 No I don't always have enough money to buy the food I need 0 No I eat alone most of the time 0 No I take three or more different prescribed or over-the-counter drugs a day 1 Yes 0 No Without wanting to, I have lost or gained 10 pounds in the last six months I am not always physically able to shop, cook and/or feed myself 0 No Nutrition Protocols Good Risk Protocol 0 No interventions needed Moderate Risk Protocol High Risk Proctocol Risk Level: Good Risk Score: 1 Electronic Signature(s) Signed: 04/19/2019 6:14:01 PM By: Baruch Gouty RN, BSN Entered By: Baruch Gouty on 04/19/2019 11:07:43

## 2019-04-22 ENCOUNTER — Encounter: Payer: Self-pay | Admitting: Internal Medicine

## 2019-04-22 ENCOUNTER — Ambulatory Visit: Payer: Medicare HMO | Admitting: Internal Medicine

## 2019-04-22 ENCOUNTER — Other Ambulatory Visit: Payer: Self-pay

## 2019-04-22 VITALS — BP 132/80 | HR 86 | Ht 64.0 in | Wt 128.4 lb

## 2019-04-22 DIAGNOSIS — R05 Cough: Secondary | ICD-10-CM | POA: Diagnosis not present

## 2019-04-22 DIAGNOSIS — Z5181 Encounter for therapeutic drug level monitoring: Secondary | ICD-10-CM | POA: Diagnosis not present

## 2019-04-22 DIAGNOSIS — J849 Interstitial pulmonary disease, unspecified: Secondary | ICD-10-CM | POA: Diagnosis not present

## 2019-04-22 DIAGNOSIS — R053 Chronic cough: Secondary | ICD-10-CM

## 2019-04-22 LAB — HEPATIC FUNCTION PANEL
ALT: 25 U/L (ref 0–35)
AST: 27 U/L (ref 0–37)
Albumin: 4.1 g/dL (ref 3.5–5.2)
Alkaline Phosphatase: 76 U/L (ref 39–117)
Bilirubin, Direct: 0.1 mg/dL (ref 0.0–0.3)
Total Bilirubin: 0.8 mg/dL (ref 0.2–1.2)
Total Protein: 7.2 g/dL (ref 6.0–8.3)

## 2019-04-22 NOTE — Progress Notes (Signed)
IOV 03/21/2017  Chief Complaint  Patient presents with  . Advice Only    Former RA pt. Pt states she has been going to [facility] and is on 3-4L O2 with exertion and sometimes at night. Has SOB mainly with exertion.    [name] 85 y.o. female with interstitial lung disease.  History is gained from talking to her and review of the chart.  She was originally diagnosed with interstitial lung disease by Dr. Kara Mead in our practice.  Subsequently she transferred her care to [facility] Dr. [name] who has been following her.  Review of the chart shows the differential diagnosis is IPF versus chronic hypersensitivity pneumonitis based on some air trapping done in her high-resolution CT scan of the chest in 2017/2018.  She is known to have chronic stable interstitial lung disease on previous imaging dating as back as 2008 but she is only aware of her interstitial lung disease in the recent few years.  There is excellent documentation in Dr. [name] medical notes.  It appears based on the goals of care of the patient and her exertional desaturation that surgical lung biopsy was deferred and anti-fibrotic therapy was deferred.  Patient confirms the same.  She is extremely physically fit and thus good aerobic activity on 4 L of oxygen outpacing people much younger than her.  It appears over many years has been a gradual decline in her fibrosis and pulmonary health but she still extremely functional and does well.  She is mainly here today to have a pulmonary fibrosis resource in [place], [place].  She sees Dr. Lauris Chroman every 4 months.  She prefers to see Dr. Lauris Chroman and do all her monitoring in terms of her 6-minute walk test, pulmonary function test and her high-resolution CT scan at [facility] because she is very comfortable with the personnel there.  Yet because she is local in [place] she wants to tap into the resources here in case of an emergency.  We discussed  about alternating her follow-up between myself and Dr. Lauris Chroman every 4 months which would make her visit to either one place every 8 months and she seemed to think this was a good plan.  At this point as she is stable and she is satisfied with all pillars of our care.  She is not interested in pulmonary rehabilitation because rightfully so she exercises pretty actively.  She has been the patient support group and did not find value in it.  She is not a transplant candidate.  And she is deferred anti-fibrotic therapy.  She might or might not be interested in research trial and this was not discussed.  It appears the only new issue other than her overall stability is that for the last 1 year she has mild random dysphagia although no active acid reflux.  This can happen for liquids or solids or tablets and is very random.  She has not seen a gastroenterologist for this.  She is not on any proton pump inhibitors.  In addition she tells me she uses of feather down pillow all her life.  She is also use talcum powder all her life and with the recent lawsuit against [employer] about talcum powder she wonders if this could be contributory to her pulmonary fibrosis.  Otherwise [employer] of chest physicians interstitial lung disease questionnaire is positive for cigarette smoking from age 84 when she smoked a pack a week to age 56.  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  She did recently try a course of prednisone through Dr. Lauris Chroman for her interstitial lung disease but she did not seem to benefit from this and this is been slowly weaned off.  Her autoimmune antibody at [facility] and in [place] was normal.  Pulmonary function test from [facility] was reviewed and the images of CT scan of the chest done in [place] visualized  Walking desaturation test on 03/21/2017 185 feet x 3 laps on ROOM AIR:  did not desaturate. Rest pulse ox was 100%, final pulse ox was 88%. HR response  71/min at rest to 120/min at peak exertion. Patient [name]  Did yes Desaturate </= 88% . Unnamed P Bann yes  Desaturated </= 3% points. [name] yes did get tachyardic  IMPRESSION: 1. Pulmonary parenchymal pattern of fibrosis is progressive, most indicative of usual interstitial pneumonitis (UIP). 2. Coronary artery calcification.   Electronically Signed   By: Lorin Picket M.D.   On: 10/27/2014 15:51 OV 06/21/2017   Chief Complaint  Patient presents with  . Follow-up    Pt saw Dr. Lauris Chroman 2/4.  Pt states she has been doing well up until 10 days ago when she developed a cold and is coughing, postnasal drainage and some mild chest tightness.   [name] presents for follow-up of interstitial lung disease [IPF most likely versus chronic HP less likely],. Is supportive care. She follows mostly at [facility] with Dr. [name]. I am her local resource. This is a scheduled appointment. Last seen by Dr. Lauris Chroman in February 2019. Notes reviewed and patient also confirms the same. It appears that without low-dose prednisone her cough relapsed. Therefore she went back on 5 mg prednisone and she was doing well in terms of her cough. However 10 days ago she developed a runny nose and cold and since then has increased cough compared to baseline. She also has a nasal twang. This a slight increase in sputum production compared to baseline and the sputum is off color. This is not normal for her. The might be some increased wheezing but his sleep quality is good. Overall effort tolerance continues to be good. She exercises with oxygen. At room air at rest she does not need oxygen. Most recently in February 2019 her ILD was deemed stable by Dr. Lauris Chroman. [place] the [place] days she wants treatment recommendation for the current deterioration. She does not want to do high dose prednisone because of fear of hallucinationsthat have happened in the past. She also now  wants to alternate pulmonary fibrosis follow-up between [facility] and myself.   She also last several months c/o loss of taste and asking for directions in resolving this issue      OV 01/23/2018  Subjective:  Patient ID: [name], female , DOB: 02-Jul-1934 , age 15 y.o. , MRN: [id number] , ADDRESS: [place] [id number]   01/23/2018 -   Chief Complaint  Patient presents with  . Follow-up    States her breathing has been at her baseline. Denies chest pain or chest discomfort. States tesslon helps but it is too expensive.      HPI [facility] 85 y.o. -returns for follow-up of her interstitial lung disease that has a differential diagnosis between IPF and chronic hypersensitivity pneumonitis.  After seeing me last she saw Dr. Lauris Chroman in August 2019.  Since then she has been doing stable.  I reviewed those notes.  Compared to recent visits her CT scan is stable and her walk test is stable.  She is able to manage with 2 L oxygen.  She has a portable system.  She does feel that she is unable to do more than she would like to do because of her ILD.  She does admit that compared to 3 years ago she says progressive ILD.  She also has significant cough for which she likes Ladona Ridgel but she says insurance is making him pay $3 a pill and so she is upset about that.  Review of immunization record shows that she has not had Pneumovax or flu shot this year but she says she is up-to-date.  We discussed the new [employer] trial for progressive non-IPF ILD where there was beneficial effect of nintedanib.  We discussed this and she is willing to try this after the new year 2020.  She wants to make sure that her [place] ILD specialist Dr. Lauris Chroman is on board with this.  We discussed the side effects in detail.    OV 03/08/2018  Subjective:  Patient ID: [name], female , DOB: 1934-04-17 , age 68 y.o. , MRN: [id number] , ADDRESS: [place]  [id number]   03/08/2018 -   Chief Complaint  Patient presents with  . Follow-up    Pt states she has been well since last visit. States she does become SOB with exertion and does have an occ cough with clear mucus. Denies any CP.    ilD FOLLOWUP   HPI [facility] 85 y.o. -returns for followup. Over xmas 2019 because of social stress increased prednisone to 51m per day but now back to 527mper day. Dyspnea with exergtion continues but stable. Cough that is very bothersome continue.  She takes Te[employer] boyor this this helps her.  However it is expensive.  She is placed a call to [employer] apparently they are going to try to work out where she can make it more affordable because I ILD is a diagnosis.  She admitted that she has down pillows and feather blankets.  1 of her close friends Mr. TaLovena Leho also has hypersensitivity pneumonitis and sees me advised her to get rid of those and therefore she is in the process of doing that.  She asked about further options and treatment of a cough.  We discussed gabapentin no increasing steroid dose participating in a cough study but she did not want to add these complications to her.  She seems content with the option of trying an inspiratory muscle trainer and seeing if the cough would get better.  Overall given her progressive ILD and the possibility that this is IPF she is willing to now try nintedanib.  We discussed nintedanib extensively.  She denies any heart disease.  She is not on any major anticoagulation.  She does not have any GI issues other than constipation.  She is willing to try nintedanib.  She is nervous about the co-pay and said if the co-pay is too expensive despite charity program then she will not take it.  [facility].          OV 01/17/2019  Subjective:  Patient ID: Terry Lorenzofemale , DOB: 4/02-Aug-1934 age 85.o. , MRN: [id number] [address] [address] [id number] 01/17/2019 -  Chief Complaint  Patient presents with  . Follow-up    Pt states she has been okay since last visit. Pt states SOB has become worse and is coughing a lot.   Interstitial lung disease-IPF [UIP in August 2016 CT scan read locally] versus second distant possibility chronic hypersensitivity pneumonitis [feather pillow exposure and air trapping on CT scan per report]   HPI [facility] 85 y.o. -last seen January 2020.  Since then in the last 6 months he has declined significantly.  She did see Dr. Lauris Chroman about a month ago at [facility]  I reviewed the chart.  Her shortness of breath with exertion has declined.  With exercise and walking she is using 4 L of oxygen.  Her cough also got worse.  He has increased the prednisone to 10 mg/day.  This is helped her cough.  Nevertheless she randomly gets severe coughing spells that makes her feel that she is going to die.  She is got a facemask oxygen is going to use that.  There was conversation about increasing the prednisone even further according to her history but she declined.  At this point in time she just wants to monitor the situation.  She certainly is open to taking more prednisone if the cough got worse.  She did have a high-resolution CT scan of the chest December 10, 2018 at [facility].  I reviewed the result.  Images not available for visualization.  They report definite persistent air trapping.  They also report some nodular opacities in the left lung.  Overall worsening fibrosis especially on the left greater than the right.  She did have an echocardiogram.  Her symptom scores currently are listed below and it shows significant level of symptoms.  We discussed antifibrotic nintedanib.  She says she is so frustrated because of the cost.  She says she tried a level best to get the cost down but could not.  She is pretty pessimistic that she will not be able to get a discount on the drug.   We discussed about the fact her disease continues to be progressive and to see if the cost would justify some of the benefit it might give.  She is willing to have me inquire about the cost.  We discussed research as a care option.  We discussed in detail the concept of research being voluntary.  The principles of randomization to placebo.  The need to do study visits.  Inclusion exclusion criteria.  She is willing to go through screening for any protocol for which she might qualify.  This will be possible after January 2020.  She had questions about the Covid vaccine.  She is social distancing.  She is afraid of getting Covid.     OV 04/22/2019  Subjective:  Patient ID: [name], female , DOB: 1934/09/30 , age 85 y.o. , MRN: [id number] , ADDRESS: [place] [address]   04/22/2019 -   Chief Complaint  Patient presents with  . Follow-up    Pt states she has been doing well since last visit and states her breathing is stable.   Interstitial lung disease ( Dr Lauris Chroman and Dr [name])  -IPF [UIP in August 2016 CT scan read locally] versus   - chronic hypersensitivity pneumonitis [feather pillow exposure and air trapping on CT scan per report]  - started steroids Oct 2017 - [facility] Dr Lauris Chroman  - Dickey Gave Jan 2021  - air purifier since jan 2021  HPI [facility] 85 y.o. -presents with her husband.  She says overall she is doing well.  She is now taking the nintedanib for the last 3 weeks.  She is entering the fourth week of the nintedanib.  She says since she started nintedanib cough is improved all the dyspnea is the same.  She now has air purifier at home.  Review of her symptom score shows improvement in her symptoms.  In terms of her nintedanib tolerance she is tolerating it fine.  She says so far no side effects.  Liver function test March 27, 2019 is normal.  She is interested in research protocols.  On this visit I was able to review her [place] records.  She last saw  Dr. Lauris Chroman in October 2020.  Dr. Lauris Chroman was concerned about her progression.  She did have a high-resolution CT chest that I cannot visualize the image myself because it was done at [facility] the report there is one that is more consistent with an alternative diagnosis to UIP and more consistent with chronic hypersensitivity pneumonitis.  As always a debate between IPF versus chronic hypersensitive pneumonitis in [place]. [place] with the weight of the clinical evidence being more towards IPF per [facility] records.  I reviewed our records and so far we have never discussed in our multidisciplinary case conference.  Her walking desaturation test clearly shows deterioration compared to 1 year ago  She also told me a little bit of blurring of vision on and off with prednisone.  She wanted to know safe prednisone dose.  She feels 5 mg of prednisone does not control her symptoms well but 10 mg dose.  Her main new issue - new issue venous stasis RLE.  She has a nonhealing wound ulcer that is over an inch in size.  She is wrapped it with TED stockings and Ace wrap.  I am not able to see the wound.  She sees the wound care here.  She keeps the leg elevated intermittently during the daytime per their advice.  She is frustrated by this.    SYMPTOM SCALE - ILD Nov 2020 04/22/2019 - ofev since jan 2021   O2 use 4L with ex Portable with exertion  Shortness of Breath  0 -> 5 scale with 5 being worst (score 6 If unable to do)  At rest 2 0  Simple tasks - showers, clothes change, eating, shaving 2 1  Household (dishes, doing bed, laundry) 4 3.5  Shopping 3 2  Walking level at own pace 3 3  Walking up Stairs 5 4  Total (30-36) Dyspnea Score 19 11.5  How bad is your cough? 4 2.5 improved with ofev  How bad is your fatigue 2 1  How bad is nausea  0  How bad is vomiting?   0  How bad is diarrhea?  0  How bad is anxiety?  1  How bad is depression  0        Results for [name], [name] (MRN [id number]) as of  01/23/2018 17:05  Ref. Range 10/28/2014 16:39 10/03/2017 - [facility] 12/10/2018 [facility]  FVC-Pre Latest Units: L 2.19 1.85 1.64  FVC-%Pred-Pre Latest Units: % 84 69% 66   Results for [name], [name] (MRN [id number]) as of 01/23/2018 17:05  Ref. Range 10/28/2014 16:39 01/23/2018  12/10/2018  DLCO unc Latest Units: ml/min/mmHg 12.16 8.36 5.68  DLCO unc % pred Latest Units: % 52 51% 30    Simple office walk 250 feet x  3 laps goal with forehead probe 03/08/2018  04/22/2019   O2 used Room air Room air  Number laps completed 3 Only did 2 laps  Comments about pace brisk Mod pace  Resting Pulse Ox/HR 99% and 85/min 99% and 86/min  Final Pulse Ox/HR 90% and 124/min 87% and 119/min  Desaturated </= 88% no yes  Desaturated <= 3% points yes yues  Got Tachycardic >/= 90/min yes yes  Symptoms at end of test Moderate dyspnea Mod dyspnea  Miscellaneous comments x woprse     HRCT oct 2020 [facility]  Impression: 1. Slightly increased left greater than right lung pulmonary fibrosis favored to be due to chronic hypersensitivity pneumonitis. Increased superimposed nodular opacities in the left lung are favored to be related to worsening fibrosis with superimposed infection/malignancy not excluded, attention on follow-up.  Electronically Reviewed by:  Verdell Carmine, MD, [facility] Electronically Reviewed on:  12/10/2018 12:00 PM  I have reviewed the images and concur with the above findings.  Electronically Signed by:  Tessa Lerner, MDPhD,[facility] Electronically Signed on:  12/10/2018 5:39 PM   Echo [facility] oct 2020 INTERPRETATION --------------------------------------------------------------- NORMAL LEFT VENTRICULAR SYSTOLIC FUNCTION WITH MILD LVH NORMAL RIGHT VENTRICULAR SYSTOLIC FUNCTION VALVULAR REGURGITATION: TRIVIAL MR, TRIVIAL PR, MILD TR NO VALVULAR STENOSIS MILD TO MODERATE TR NO PRIOR STUDY FOR COMPARISON  ROS - per HPI     has a past medical history of Hemorrhoids and IPF  (idiopathic pulmonary fibrosis) ([place]).   reports that she quit smoking about 32 years ago. Her smoking use included cigarettes. She has a 7.50 pack-year smoking history. She has never used smokeless tobacco.  Past Surgical History:  Procedure Laterality Date  . BREAST EXCISIONAL BIOPSY Left    x 2  . TONSILLECTOMY     age 40    Allergies  Allergen Reactions  . Prednisone     Hallucination when given in high doses     Immunization History  Administered Date(s) Administered  . Influenza, High Dose Seasonal PF 10/29/2016  . Influenza,inj,Quad PF,6+ Mos 11/05/2014  . Influenza-Unspecified 11/28/2013, 11/05/2014, 11/30/2015, 11/15/2018  . PFIZER SARS-COV-2 Vaccination 03/11/2019, 03/30/2019  . Pneumococcal Polysaccharide-23 02/10/2018  . Tdap 08/22/2016  . Zoster Recombinat (Shingrix) 09/22/2016, 12/22/2016    Family History  Problem Relation Age of Onset  . Stroke Father   . Lung cancer Brother        mets     Current Outpatient Medications:  .  aspirin 81 MG chewable tablet, Chew 81 mg by mouth daily., Disp: , Rfl:  .  atorvastatin (LIPITOR) 10 MG tablet, Take 10 mg by mouth daily., Disp: , Rfl:  .  benzonatate (TESSALON) 200 MG capsule, Take 1 capsule by mouth as needed., Disp: , Rfl:  .  calcium carbonate (OS-CAL) 600 MG TABS, Take 600 mg by mouth daily., Disp: , Rfl:  .  LINZESS 145 MCG CAPS capsule, , Disp: , Rfl:  .  Nintedanib (OFEV) 150 MG CAPS, Take 150 mg by mouth 2 (two) times daily., Disp: , Rfl:  .  predniSONE (DELTASONE) 10 MG tablet, PLEASE SEE ATTACHED FOR DETAILED DIRECTIONS, Disp: , Rfl:       Objective:   Vitals:   04/22/19 1013  BP: 132/80  Pulse: 86  SpO2: 99%  Weight: 128 lb 6.4 oz (58.2 kg)  Height: 5' 4" (1.626 m)    Estimated body mass index is 22.04 kg/m as calculated from the following:   Height as of this encounter: 5' 4" (1.626 m).  Weight as of this encounter: 128 lb 6.4 oz (58.2 kg).  _0 @  [employer]    04/22/19 1013  Weight: 128 lb 6.4 oz (58.2 kg)     Physical Exam  General Appearance:    Alert, cooperative, no distress, appears stated age - yes , Deconditioned looking - no , OBESE  - no, Sitting on Wheelchair -  no  Head:    Normocephalic, without obvious abnormality, atraumatic  Eyes:    PERRL, conjunctiva/corneas clear,  Ears:    Normal TM's and external ear canals, both ears  Nose:   Nares normal, septum midline, mucosa normal, no drainage    or sinus tenderness. OXYGEN ON  - no . Patient is @ no   Throat:   Lips, mucosa, and tongue normal; teeth and gums normal. Cyanosis on lips - no  Neck:   Supple, symmetrical, trachea midline, no adenopathy;    thyroid:  no enlargement/tenderness/nodules; no carotid   bruit or JVD  Back:     Symmetric, no curvature, ROM normal, no CVA tenderness  Lungs:     Distress - no , Wheeze no, Barrell Chest - no, Purse lip breathing - no, Crackles - yes   Chest Wall:    No tenderness or deformity.    Heart:    Regular rate and rhythm, S1 and S2 normal, no rub   or gallop, Murmur - no  Breast Exam:    NOT DONE  Abdomen:     Soft, non-tender, bowel sounds active all four quadrants,    no masses, no organomegaly. Visceral obesity - no  Genitalia:   NOT DONE  Rectal:   NOT DONE  Extremities:   Extremities - normal, Has Cane - no, Clubbing - no, Edema - no. RLE in wrap  Pulses:   2+ and symmetric all extremities  Skin:   Stigmata of Connective Tissue Disease - no  Lymph nodes:   Cervical, supraclavicular, and axillary nodes normal  Psychiatric:  Neurologic:   Pleasant - yesn, Anxious - no, Flat affect - no  CAm-ICU - neg, Alert and Oriented x 3 - yes, Moves all 4s - yes, Speech - normal, Cognition - intact           Assessment:       ICD-10-CM   1. ILD (interstitial lung disease) (HCC)  J84.9 Hepatic function panel  2. Therapeutic drug monitoring  Z51.81 Hepatic function panel  3. Chronic cough  R05        Plan:     Patient Instructions      ICD-10-CM   1. ILD (interstitial lung disease) ([place])  J84.9   2. Therapeutic drug monitoring  Z51.81   3. Chronic cough  R05      Progressive disease causing more symptoms and o2 need with exertion but seems stable since last visit  Glad prednisone 27m per day (higher dose) Is helping you but agree some blurring of vision can be related to this  Glad you are tolerating ofev fine - starting 4th week  My sense is that maybe yours is chronic hypersensitive pneumonitis as opposed to IPF -this is based on exposure history and air trapping  -Both diseases can variably progress  -And in both disease nintedanib can be helpful  Plan - -Contact [facility] -on priority list in case of energy failure-  - continue o2 with exertion  - continue prednisone 121mper day for cough and if cough gets worse we can increase it -Continue nintedanib  150 mg twice daily  -Check liver function test today [last 1 was March 27, 2019]  -Check liver function every month for the next 6 months and then every 3 months after that  -I will present your scan at our multidisciplinary case conference and see if the overall opinion is chronic hypersensitive pneumonitis versus IPF   -If chronic hypersensitive pneumonitis then you would qualify for the registry study  -If IPF there are therapeutic trials but I am worried that you might not qualify for many of them either because of the scan features of because of age  -But overall appreciate you looking and being willing to see research as a care option and to help develop science  Followup  - -Keep April 2021 visit Dr[facility] [name] [facility] -Return to see Dr. [name] 30-minute slot in June/July 2021 -ILD clinic any day preferred   ( Level 05 visit: Estb 40-54 min   in  visit type: on-site physical face to visit  in total care time and counseling or/and coordination of care by this undersigned MD - Dr Brand Males. This includes one or more of  the following on this same day 04/22/2019: pre-charting, chart review, note writing, documentation discussion of test results, diagnostic or treatment recommendations, prognosis, risks and benefits of management options, instructions, education, compliance or risk-factor reduction. It excludes time spent by the [place] or office staff in the care of the patient. Actual time 64 min)    SIGNATURE    Dr. Brand Males, M.D., F.C.C.P,  Pulmonary and Critical Care Medicine Staff Physician, [place] Director - Interstitial Lung Disease  Program  Pulmonary [place] at [place], [place], [address]  Pager: [phone], If no answer or between  15:00h - 7:00h: call [phone] Telephone: [phone]  11:10 AM 04/22/2019

## 2019-04-22 NOTE — Progress Notes (Signed)
WANZA, SZUMSKI ([id number]) Visit Report for 04/19/2019 Allergy List Details Patient Name: Date of Service: [name], [name] 04/19/2019 10:30 AM Medical Record [id number] Patient Account Number: [id number] Date of Birth/Sex: Treating RN: 20-May-1934 (85 y.o. Female) Baruch Gouty Primary Care Dillard Pascal: [name] Other Clinician: Referring Rykar Lebleu: Treating Carlis Burnsworth/Extender:Robson, Vista Lawman, Moses Manners in Treatment: 0 Allergies Active Allergies No Known Allergies Allergy Notes Electronic Signature(s) Signed: 04/19/2019 6:14:01 PM By: Baruch Gouty RN, BSN Entered By: Baruch Gouty on 04/19/2019 10:55:53 -------------------------------------------------------------------------------- Arrival Information Details Patient Name: Date of Service: [name]. 04/19/2019 10:30 AM Medical Record [id number] Patient Account Number: [id number] Date of Birth/Sex: Treating RN: [birth year] (85 y.o. Female) Baruch Gouty Primary Care Joyce Leckey: [name] Other Clinician: Referring Alinda Egolf: Treating Pam Vanalstine/Extender:Robson, Vista Lawman, Moses Manners in Treatment: 0 Visit Information Patient Arrived: Ambulatory Arrival Time: 10:45 Accompanied By: self Transfer Assistance: None Patient Identification Verified: Yes Secondary Verification Process Completed: Yes Patient Requires Transmission-Based No Precautions: Patient Has Alerts: No Electronic Signature(s) Signed: 04/19/2019 6:14:01 PM By: Baruch Gouty RN, BSN Entered By: Baruch Gouty on 04/19/2019 10:52:17 -------------------------------------------------------------------------------- Clinic Level of Care Assessment Details Patient Name: Date of Service: [name], [name] 04/19/2019 10:30 AM Medical Record [id number] Patient Account Number: [id number] Date of Birth/Sex: Treating RN: Apr 16, 1934 (85 y.o. Female) Kela Millin Primary Care Pahoua Schreiner: [name] Other  Clinician: Referring Delbert Vu: Treating Tiquan Bouch/Extender:Robson, Vista Lawman, Moses Manners in Treatment: 0 Clinic Level of Care Assessment Items TOOL 1 Quantity Score X - Use when EandM and Procedure is performed on INITIAL visit 1 0 ASSESSMENTS - Nursing Assessment / Reassessment X - General Physical Exam (combine w/ comprehensive assessment (listed just below) 1 20 when performed on new pt. evals) X - Comprehensive Assessment (HX, ROS, Risk Assessments, Wounds Hx, etc.) 1 25 ASSESSMENTS - Wound and Skin Assessment / Reassessment _0  - Dermatologic / Skin Assessment (not related to wound area) 0 ASSESSMENTS - Ostomy and/or Continence Assessment and Care _1  - Incontinence Assessment and Management 0 _2  - Ostomy Care Assessment and Management (repouching, etc.) 0 PROCESS - Coordination of Care X - Simple Patient / Family Education for ongoing care 1 15 _3  - Complex (extensive) Patient / Family Education for ongoing care 0 X - Staff obtains Consents, Records, Test Results / Process Orders 1 10 _4  - Staff telephones HHA, Nursing Homes / Clarify orders / etc 0 _5  - Routine Transfer to another Facility (non-emergent condition) 0 _6  - Routine Hospital Admission (non-emergent condition) 0 X - New Admissions / Biomedical engineer / Ordering NPWT, Apligraf, etc. 1 15 _7  - Emergency Hospital Admission (emergent condition) 0 PROCESS - Special Needs _8  - Pediatric / Minor Patient Management 0 _9  - Isolation Patient Management 0 _10  - Hearing / Language / Visual special needs 0 _11  - Assessment of Community assistance (transportation, D/C planning, etc.) 0 _12  - Additional assistance / Altered mentation 0 _13  - Support Surface(s) Assessment (bed, cushion, seat, etc.) 0 INTERVENTIONS - Miscellaneous _14  - External ear exam 0 _15  - Patient Transfer (multiple staff / Civil Service fast streamer / Similar devices) 0 _16  - Simple Staple / Suture removal (25 or less) 0 _17  - Complex Staple / Suture removal (26 or  more) 0 _18  - Hypo/Hyperglycemic Management (do not check if billed separately) 0 _19  - Ankle / Brachial Index (ABI) - do not check if billed separately 0 Has the patient been seen at the hospital within the last three years: Yes Total Score: 85 Level Of Care: New/Established - Level 3 Electronic Signature(s) Signed: 04/22/2019 1:40:10  PM By: Kela Millin Entered By: Kela Millin on 04/19/2019 12:05:24 -------------------------------------------------------------------------------- Compression Therapy Details Patient Name: Date of Service: REDONNA, WILBERT 04/19/2019 10:30 AM Medical Record [id number] Patient Account Number: [id number] Date of Birth/Sex: 1934/10/14 (84 y.o. Female) Treating RN: Kela Millin Primary Care Chonda Baney: [name] Other Clinician: Referring Keithan Dileonardo: Treating Adelisa Satterwhite/Extender:Robson, Vista Lawman, Moses Manners in Treatment: 0 Compression Therapy Performed for Wound Wound #1 Right,Lateral Lower Leg Assessment: Performed By: Clinician Deon Pilling, RN Compression Type: Three Layer Post Procedure Diagnosis Same as Pre-procedure Electronic Signature(s) Signed: 04/22/2019 1:40:10 PM By: Kela Millin Entered By: Kela Millin on 04/19/2019 12:14:56 -------------------------------------------------------------------------------- Compression Therapy Details Patient Name: Date of Service: KYANDRA, MCCLAINE 04/19/2019 10:30 AM Medical Record [id number] Patient Account Number: [id number] Date of Birth/Sex: Apr 23, 1934 (84 y.o. Female) Treating RN: Kela Millin Primary Care Bear Osten: [name] Other Clinician: Referring Stefhanie Kachmar: Treating Antaeus Karel/Extender:Robson, Vista Lawman, Moses Manners in Treatment: 0 Compression Therapy Performed for Wound Wound #2 Right,Medial Lower Leg Assessment: Performed By: Clinician Deon Pilling, RN Compression Type: Three Layer Post Procedure Diagnosis Same as  Pre-procedure Electronic Signature(s) Signed: 04/22/2019 1:40:10 PM By: Kela Millin Entered By: Kela Millin on 04/19/2019 12:14:56 -------------------------------------------------------------------------------- Lower Extremity Assessment Details Patient Name: Date of Service: DIONICIA, CERRITOS 04/19/2019 10:30 AM Medical Record [id number] Patient Account Number: [id number] Date of Birth/Sex: Treating RN: December 11, 1934 (85 y.o. Female) Baruch Gouty Primary Care Dickie Labarre: [name] Other Clinician: Referring Hobson Lax: Treating Sumaiya Arruda/Extender:Robson, Vista Lawman, Moses Manners in Treatment: 0 Edema Assessment Assessed: [Left: No] [Right: No] Edema: [Left: Ye] [Right: s] Calf Left: Right: Point of Measurement: cm From Medial Instep cm 32 cm Ankle Left: Right: Point of Measurement: cm From Medial Instep cm 23 cm Vascular Assessment Pulses: Dorsalis Pedis Palpable: [Right:No] Doppler Audible: [Right:Inaudible] Blood Pressure: Brachial: [Right:135] Ankle: [Right:Posterior Tibial: 140 1.04] Notes unable to hear DP pulse with doppler Electronic Signature(s) Signed: 04/19/2019 6:14:01 PM By: Baruch Gouty RN, BSN Entered By: Baruch Gouty on 04/19/2019 11:31:41 -------------------------------------------------------------------------------- Multi Wound Chart Details Patient Name: Date of Service: [name]. 04/19/2019 10:30 AM Medical Record [id number] Patient Account Number: [id number] Date of Birth/Sex: Treating RN: 11/25/1934 (85 y.o. Female) Kela Millin Primary Care Garen Woolbright: [name] Other Clinician: Referring Mani Celestin: Treating Silvina Hackleman/Extender:Robson, Vista Lawman, Moses Manners in Treatment: 0 Vital Signs Height(in): 78 Pulse(bpm): 59 Weight(lbs): 125 Blood Pressure(mmHg): 123/88 Body Mass Index(BMI): 21 Temperature(F): 97.8 Respiratory 18 Rate(breaths/min): Photos: [1:No Photos] [2:No Photos] [N/A:N/A] Wound  Location: [1:Right Lower Leg - Lateral Right Lower Leg - Medial N/A] Wounding Event: [1:Trauma] [2:Trauma] [N/A:N/A] Primary Etiology: [1:Venous Leg Ulcer] [2:Venous Leg Ulcer] [N/A:N/A] Comorbid History: [1:Cataracts, Confinement Anxiety] [2:Cataracts, Confinement Anxiety] [N/A:N/A] Date Acquired: [1:02/27/2019] [2:02/17/2019] [N/A:N/A] Weeks of Treatment: [1:0] [2:0] [N/A:N/A] Wound Status: [1:Open] [2:Open] [N/A:N/A] Measurements L x W x D 1.4x2.7x0.1 [2:0.6x0.5x0.1] [N/A:N/A] (cm) Area (cm) : [1:2.969] [2:0.236] [N/A:N/A] Volume (cm) : [1:0.297] [2:0.024] [N/A:N/A] Classification: [1:Full Thickness Without Exposed Support Structures Exposed Support Structures] [2:Full Thickness Without] [N/A:N/A] Exudate Amount: [1:Small] [2:None Present] [N/A:N/A] Exudate Type: [1:Serosanguineous] [2:N/A] [N/A:N/A] Exudate Color: [1:red, brown] [2:N/A] [N/A:N/A] Wound Margin: [1:Flat and Intact] [2:Flat and Intact] [N/A:N/A] Granulation Amount: [1:Small (1-33%)] [2:Large (67-100%)] [N/A:N/A] Granulation Quality: [1:Red] [2:Red] [N/A:N/A] Necrotic Amount: [1:Large (67-100%)] [2:Small (1-33%)] [N/A:N/A] Exposed Structures: [1:Fat Layer (Subcutaneous Tissue) Exposed: Yes Fascia: No Tendon: No Muscle: No Joint: No Bone: No] [2:Fat Layer (Subcutaneous Tissue) Exposed: Yes Fascia: No Tendon: No Muscle: No Joint: No Bone: No] [N/A:N/A] Epithelialization: [1:Small (1-33%)] [2:Small (1-33%)] [N/A:N/A] Debridement: [1:Debridement - Excisional] [2:Debridement - Excisional] [N/A:N/A] Pre-procedure [1:12:00] [2:12:00] [N/A:N/A]  Verification/Time Out Taken: Pain Control: [1:Other] [2:Other] [N/A:N/A] Tissue Debrided: [1:Subcutaneous, Slough] [2:Subcutaneous, Slough] [N/A:N/A] Level: [1:Skin/Subcutaneous Tissue] [2:Skin/Subcutaneous Tissue] [N/A:N/A] Debridement Area (sq cm):3.78 [2:0.3] [N/A:N/A] Instrument: [1:Curette] [2:Curette] [N/A:N/A] Bleeding: [1:Minimum] [2:Minimum] [N/A:N/A] Hemostasis Achieved:  [1:Pressure] [2:Pressure] [N/A:N/A] Procedural Pain: [1:0] [2:0] [N/A:N/A] Post Procedural Pain: [1:0] [2:0] [N/A:N/A] Debridement Treatment Procedure was tolerated [2:Procedure was tolerated] [N/A:N/A] Response: [1:well] [2:well] Post Debridement [1:1.4x2.7x0.1] [2:0.6x0.5x0.1] [N/A:N/A] Measurements L x W x D (cm) Post Debridement [1:0.297] [2:0.024] [N/A:N/A] Volume: (cm) Procedures Performed: Compression Therapy [1:Debridement] [2:Compression Therapy Debridement] [N/A:N/A] Treatment Notes Electronic Signature(s) Signed: 04/19/2019 6:29:59 PM By: Linton Ham MD Signed: 04/22/2019 1:40:10 PM By: Kela Millin Entered By: Linton Ham on 04/19/2019 12:56:00 -------------------------------------------------------------------------------- Multi-Disciplinary Care Plan Details Patient Name: Date of Service: [name]. 04/19/2019 10:30 AM Medical Record [id number] Patient Account Number: [id number] Date of Birth/Sex: Treating RN: 11/08/1934 (85 y.o. Female) Kela Millin Primary Care Jannah Guardiola: [name] Other Clinician: Referring Zaniyah Wernette: Treating Miachel Nardelli/Extender:Robson, Vista Lawman, Moses Manners in Treatment: 0 Active Inactive Orientation to the Wound Care Program Nursing Diagnoses: Knowledge deficit related to the wound healing center program Goals: Patient/caregiver will verbalize understanding of the [place] Date Initiated: 04/19/2019 Target Resolution Date: 05/10/2019 Goal Status: Active Interventions: Provide education on orientation to the wound center Notes: Pain, Acute or Chronic Nursing Diagnoses: Pain, acute or chronic: actual or potential Goals: Patient/caregiver will verbalize adequate pain control between visits Date Initiated: 04/19/2019 Target Resolution Date: 05/10/2019 Goal Status: Active Interventions: Provide education on pain management Notes: Wound/Skin Impairment Nursing Diagnoses: Impaired  tissue integrity Goals: Ulcer/skin breakdown will have a volume reduction of 30% by week 4 Date Initiated: 04/19/2019 Target Resolution Date: 05/10/2019 Goal Status: Active Interventions: Provide education on ulcer and skin care Notes: Electronic Signature(s) Signed: 04/22/2019 1:40:10 PM By: Kela Millin Entered By: Kela Millin on 04/19/2019 11:59:51 -------------------------------------------------------------------------------- Pain Assessment Details Patient Name: Date of Service: [name], [name] 04/19/2019 10:30 AM Medical Record [id number] Patient Account Number: [id number] Date of Birth/Sex: 20-May-1934 (84 y.o. Female) Treating RN: Baruch Gouty Primary Care Gerado Nabers: [name] Other Clinician: Referring Honesty Menta: Treating Merilynn Haydu/Extender:Robson, Vista Lawman, Moses Manners in Treatment: 0 Active Problems Location of Pain Severity and Description of Pain Patient Has Paino Yes Site Locations Pain Location: Pain in Ulcers With Dressing Change: Yes Duration of the Pain. Constant / Intermittento Intermittent Rate the pain. Current Pain Level: 0 Worst Pain Level: 5 Least Pain Level: 0 Character of Pain Describe the Pain: Shooting, Tender, Other: pins and needles Pain Management and Medication Current Pain Management: Is the Current Pain Management Adequate: Adequate How does your wound impact your activities of daily livingo Sleep: No Bathing: No Appetite: No Relationship With Others: No Bladder Continence: No Emotions: No Bowel Continence: No Work: No Toileting: No Drive: No Dressing: No Hobbies: No Electronic Signature(s) Signed: 04/19/2019 6:14:01 PM By: Baruch Gouty RN, BSN Entered By: Baruch Gouty on 04/19/2019 11:21:56 -------------------------------------------------------------------------------- Patient/Caregiver Education Details Patient Name: [name] 2/19/2021andnbsp10:30 Date of Service: AM Medical Record  [id number] Number: Patient Account Number: [id number] Treating RN: [birth year] (84 y.o. Kela Millin Date of Birth/Gender: Female) Other Clinician: Primary Care Treating Felipa Furnace Physician: Physician/Extender: Referring Physician: Lavena Bullion in Treatment: 0 Education Assessment Education Provided To: Patient Education Topics Provided Pain: Methods: Explain/Verbal Responses: State content correctly Welcome To [facility]: Methods: Explain/Verbal Responses: State content correctly Wound/Skin Impairment: Methods: Explain/Verbal Responses: State content correctly Electronic Signature(s) Signed: 04/22/2019 1:40:10 PM By: Kela Millin Entered By: Kela Millin on 04/19/2019  12:00:08 -------------------------------------------------------------------------------- Wound Assessment Details Patient Name: Date of Service: [name], [name] 04/19/2019 10:30 AM Medical Record [id number] Patient Account Number: [id number] Date of Birth/Sex: Treating RN: 05-24-1934 (85 y.o. Female) Kela Millin Primary Care Nehal Shives: [name] Other Clinician: Referring Zekiah Caruth: Treating Makael Stein/Extender:Robson, Vista Lawman, Moses Manners in Treatment: 0 Wound Status Wound Number: 1 Primary Etiology: Venous Leg Ulcer Wound Location: Right Lower Leg - Lateral Wound Status: Open Wounding Event: Trauma Comorbid History: Cataracts, Confinement Anxiety Date Acquired: 02/27/2019 Weeks Of Treatment: 0 Clustered Wound: No Photos Wound Measurements Length: (cm) 1.4 % Reducti Width: (cm) 2.7 % Reduct Depth: (cm) 0.1 Epitheli Area: (cm) 2.969 Tunneli Volume: (cm) 0.297 Undermi Wound Description Full Thickness Without Exposed Support Foul Odo Classification: Structures Slough/F Wound Flat and Intact Margin: Exudate Small Amount: Exudate Serosanguineous Type: Exudate red, brown Color: Wound Bed Granulation Amount: Small  (1-33%) Granulation Quality: Red Fascia E Necrotic Amount: Large (67-100%) Fat Laye Necrotic Quality: Adherent Slough Tendon E Muscle E Joint Ex Bone Exp r After Cleansing: No ibrino No Exposed Structure xposed: No r (Subcutaneous Tissue) Exposed: Yes xposed: No xposed: No posed: No osed: No on in Area: 0% ion in Volume: 0% alization: Small (1-33%) ng: No ning: No Electronic Signature(s) Signed: 04/19/2019 5:06:31 PM By: Mikeal Hawthorne EMT/HBOT Signed: 04/22/2019 1:40:10 PM By: Kela Millin Entered By: Mikeal Hawthorne on 04/19/2019 15:45:25 -------------------------------------------------------------------------------- Wound Assessment Details Patient Name: Date of Service: [name]. 04/19/2019 10:30 AM Medical Record [id number] Patient Account Number: [id number] Date of Birth/Sex: Treating RN: 01/12/1935 (85 y.o. Female) Kela Millin Primary Care Faylynn Stamos: [name] Other Clinician: Referring Ginamarie Banfield: Treating Blas Riches/Extender:Robson, Vista Lawman, Moses Manners in Treatment: 0 Wound Status Wound Number: 2 Primary Etiology: Venous Leg Ulcer Wound Location: Right Lower Leg - Medial Wound Status: Open Wounding Event: Trauma Comorbid History: Cataracts, Confinement Anxiety Date Acquired: 02/17/2019 Weeks Of Treatment: 0 Clustered Wound: No Photos Wound Measurements Length: (cm) 0.6 % Reduct Width: (cm) 0.5 % Reduct Depth: (cm) 0.1 Epitheli Area: (cm) 0.236 Tunneli Volume: (cm) 0.024 Undermi Wound Description Classification: Full Thickness Without Exposed Support Foul Odo Structures Slough/F Wound Flat and Intact Margin: Exudate None Present Amount: Wound Bed Granulation Amount: Large (67-100%) Granulation Quality: Red Fascia E Necrotic Amount: Small (1-33%) Fat Laye Necrotic Quality: Adherent Slough Tendon E Muscle E Joint Ex Bone Exp r After Cleansing: No ibrino No Exposed Structure xposed: No r (Subcutaneous Tissue)  Exposed: Yes xposed: No xposed: No posed: No osed: No ion in Area: 0% ion in Volume: 0% alization: Small (1-33%) ng: No ning: No Electronic Signature(s) Signed: 04/19/2019 5:06:31 PM By: Mikeal Hawthorne EMT/HBOT Signed: 04/22/2019 1:40:10 PM By: Kela Millin Entered By: Mikeal Hawthorne on 04/19/2019 15:45:51 -------------------------------------------------------------------------------- Vitals Details Patient Name: Date of Service: [name]. 04/19/2019 10:30 AM Medical Record [id number] Patient Account Number: [id number] Date of Birth/Sex: Treating RN: 05/14/1934 (85 y.o. Female) Baruch Gouty Primary Care Narda Fundora: [name] Other Clinician: Referring Latia Mataya: Treating Maxie Debose/Extender:Robson, Vista Lawman, Moses Manners in Treatment: 0 Vital Signs Time Taken: 10:52 Temperature (F): 97.8 Height (in): 64 Pulse (bpm): 89 Source: Stated Respiratory Rate (breaths/min): 18 Weight (lbs): 125 Blood Pressure (mmHg): 123/88 Source: Stated Reference Range: 80 - 120 mg / dl Body Mass Index (BMI): 21.5 Electronic Signature(s) Signed: 04/19/2019 6:14:01 PM By: Baruch Gouty RN, BSN Entered By: Baruch Gouty on 04/19/2019 10:54:19

## 2019-04-22 NOTE — Patient Instructions (Addendum)
ICD-10-CM   1. ILD (interstitial lung disease) ([place])  J84.9   2. Therapeutic drug monitoring  Z51.81   3. Chronic cough  R05      Progressive disease causing more symptoms and o2 need with exertion but seems stable since last visit  Glad prednisone 16m per day (higher dose) Is helping you but agree some blurring of vision can be related to this  Glad you are tolerating ofev fine - starting 4th week  My sense is that maybe yours is chronic hypersensitive pneumonitis as opposed to IPF -this is based on exposure history and air trapping  -Both diseases can variably progress  -And in both disease nintedanib can be helpful  Plan - -Contact [employer] -on priority list in case of energy failure-  - continue o2 with exertion  - continue prednisone 137mper day for cough and if cough gets worse we can increase it -Continue nintedanib 150 mg twice daily  -Check liver function test today [last 1 was March 27, 2019]  -Check liver function every month for the next 6 months and then every 3 months after that  -I will present your scan at our multidisciplinary case conference and see if the overall opinion is chronic hypersensitive pneumonitis versus IPF   -If chronic hypersensitive pneumonitis then you would qualify for the registry study  -If IPF there are therapeutic trials but I am worried that you might not qualify for many of them either because of the scan features of because of age  -But overall appreciate you looking and being willing to see research as a care option and to help develop science  Followup  - -Keep April 2021 [facility] RaChase Caller0-minute slot in June/July 2021 -ILD clinic any day preferred

## 2019-04-22 NOTE — Progress Notes (Signed)
[name], [name] ([id number]) Visit Report for 04/19/2019 Chief Complaint Document Details Patient Name: Date of Service: Terry, Conway 04/19/2019 10:30 AM Medical Record [id number] Patient Account Number: [id number] Date of Birth/Sex: Treating RN: 26-Dec-1934 (85 y.o. Female) Kela Millin Primary Care Provider: [name] Other Clinician: Referring Provider: Treating Provider/Extender:Carlester Kasparek, Vista Lawman, Moses Manners in Treatment: 0 Information Obtained from: Patient Chief Complaint 04/19/2019; patient is here for review of a laceration injury x2 on her right lower extremity Electronic Signature(s) Signed: 04/19/2019 6:29:59 PM By: Linton Ham MD Entered By: Linton Ham on 04/19/2019 12:57:25 -------------------------------------------------------------------------------- Debridement Details Patient Name: Date of Service: [name]. 04/19/2019 10:30 AM Medical Record [id number] Patient Account Number: [id number] Date of Birth/Sex: 18-Apr-1934 (85 y.o. Female) Treating RN: Kela Millin Primary Care Provider: [name] Other Clinician: Referring Provider: Treating Provider/Extender:Yevonne Yokum, Vista Lawman, Moses Manners in Treatment: 0 Debridement Performed for Wound #1 Right,Lateral Lower Leg Assessment: Performed By: Physician Ricard Dillon., MD Debridement Type: Debridement Severity of Tissue Pre Fat layer exposed Debridement: Level of Consciousness (Pre- Awake and Alert procedure): Pre-procedure Verification/Time Out Taken: Yes - 12:00 Start Time: 12:00 Pain Control: Other : benzocaine, 20% Total Area Debrided (L x W): 1.4 (cm) x 2.7 (cm) = 3.78 (cm) Tissue and other material Viable, Non-Viable, Slough, Subcutaneous, Slough debrided: Level: Skin/Subcutaneous Tissue Debridement Description: Excisional Instrument: Curette Bleeding: Minimum Hemostasis Achieved: Pressure End Time: 12:01 Procedural Pain: 0 Post Procedural  Pain: 0 Response to Treatment: Procedure was tolerated well Level of Consciousness Awake and Alert (Post-procedure): Post Debridement Measurements of Total Wound Length: (cm) 1.4 Width: (cm) 2.7 Depth: (cm) 0.1 Volume: (cm) 0.297 Character of Wound/Ulcer Post Improved Debridement: Severity of Tissue Post Debridement: Fat layer exposed Post Procedure Diagnosis Same as Pre-procedure Electronic Signature(s) Signed: 04/19/2019 6:29:59 PM By: Linton Ham MD Signed: 04/22/2019 1:40:10 PM By: Kela Millin Entered By: Linton Ham on 04/19/2019 12:56:10 -------------------------------------------------------------------------------- Debridement Details Patient Name: Date of Service: [name]. 04/19/2019 10:30 AM Medical Record [id number] Patient Account Number: [id number] Date of Birth/Sex: 17-May-1934 (85 y.o. Female) Treating RN: Kela Millin Primary Care Provider: [name] Other Clinician: Referring Provider: Treating Provider/Extender:Ilianna Bown, Vista Lawman, Moses Manners in Treatment: 0 Debridement Performed for Wound #2 Right,Medial Lower Leg Assessment: Performed By: Physician Ricard Dillon., MD Debridement Type: Debridement Severity of Tissue Pre Fat layer exposed Debridement: Level of Consciousness (Pre- Awake and Alert procedure): Pre-procedure Verification/Time Out Taken: Yes - 12:00 Start Time: 12:00 Pain Control: Other : benzocaine, 20% Total Area Debrided (L x W): 0.6 (cm) x 0.5 (cm) = 0.3 (cm) Tissue and other material Viable, Non-Viable, Slough, Subcutaneous, Slough Viable, Non-Viable, Slough, Subcutaneous, Slough debrided: Level: Skin/Subcutaneous Tissue Debridement Description: Excisional Instrument: Curette Bleeding: Minimum Hemostasis Achieved: Pressure End Time: 12:01 Procedural Pain: 0 Post Procedural Pain: 0 Response to Treatment: Procedure was tolerated well Level of Consciousness Awake and  Alert (Post-procedure): Post Debridement Measurements of Total Wound Length: (cm) 0.6 Width: (cm) 0.5 Depth: (cm) 0.1 Volume: (cm) 0.024 Character of Wound/Ulcer Post Improved Debridement: Severity of Tissue Post Debridement: Fat layer exposed Post Procedure Diagnosis Same as Pre-procedure Electronic Signature(s) Signed: 04/19/2019 6:29:59 PM By: Linton Ham MD Signed: 04/22/2019 1:40:10 PM By: Kela Millin Entered By: Linton Ham on 04/19/2019 12:56:17 -------------------------------------------------------------------------------- HPI Details Patient Name: Date of Service: [name]. 04/19/2019 10:30 AM Medical Record [id number] Patient Account Number: [id number] Date of Birth/Sex: Treating RN: [birth year] (85 y.o. Female) Kela Millin Primary Care Provider: [name] Other Clinician: Referring Provider: Treating Provider/Extender:Aadith Raudenbush, Vista Lawman, Moses Manners  in Treatment: 0 History of Present Illness HPI Description: ADMISSION 04/19/2019 This is an 85 year old woman who has chronic interstitial lung disease on prednisone 10 mg a day. Towards the end of December she hit her lateral right leg on a chair. She was seen in urgent care and the area was Steri-Stripped however this did not hold together. Sometime after this traumatized the medial part of her right leg. She has some chronic edema in her lower legs probably is a secondary phenomenon to chronic venous insufficiency. She also takes chronic steroids. He saw her primary physician who is Dr. [name] at [place] and was given doxycycline on 2/8 I believe she has completed that. She is here for review of nonhealing areas on the right lateral greater than right medial calf Past medical history; includes interstitial lung disease on prednisone 10 mg and is just started Ofev. She has a history of basal cell skin cancer, hyperlipidemia. ABI in our clinic was 1.04 in the right Electronic  Signature(s) Signed: 04/19/2019 6:29:59 PM By: Linton Ham MD Entered By: Linton Ham on 04/19/2019 12:59:48 -------------------------------------------------------------------------------- Physical Exam Details Patient Name: Date of Service: [name]. 04/19/2019 10:30 AM Medical Record [id number] Patient Account Number: [id number] Date of Birth/Sex: Treating RN: [birth year] (85 y.o. Female) Kela Millin Primary Care Provider: [name] Other Clinician: Referring Provider: Treating Provider/Extender:Rollo Farquhar, Vista Lawman, Moses Manners in Treatment: 0 Constitutional Sitting or standing Blood Pressure is within target range for patient.. Pulse regular and within target range for patient.[name] Respirations regular, non-labored and within target range.. Temperature is normal and within the target range for the patient.[name] Appears in no distress. Respiratory work of breathing is normal. Coarse crackles at both lung bases. Cardiovascular Heart rhythm and rate regular, without murmur or gallop. No evidence of CHF. Her posterior tibial pulses palpable dorsalis pedis pulses somewhat reduced but still palpable on the right. Some edema bilaterally.. Musculoskeletal . Integumentary (Hair, Skin) No systemic rashes seen. Psychiatric appears at normal baseline. Notes Wound exam The major wound is a small area on the right lateral lower calf. 90% covered by very adherent fibrinous surface debris. She has a smaller area on the right medial mid calf also 100% covered by nonviable surface. Using a #5 curette both areas were debrided. Hemostasis with direct pressure. No evidence of surrounding infection. Notable for the fact that the patient appears to have Raynaud's type skin changes in her dorsal feet and toes. There is delayed capillary refill time Electronic Signature(s) Signed: 04/19/2019 6:29:59 PM By: Linton Ham MD Entered By: Linton Ham on 04/19/2019  13:02:19 -------------------------------------------------------------------------------- Physician Orders Details Patient Name: Date of Service: [name]. 04/19/2019 10:30 AM Medical Record [id number] Patient Account Number: [id number] Date of Birth/Sex: Treating RN: Nov 22, 1934 (85 y.o. Female) Kela Millin Primary Care Provider: [name] Other Clinician: Referring Provider: Treating Provider/Extender:Piccola Arico, Vista Lawman, Moses Manners in Treatment: 0 Verbal / Phone Orders: No Diagnosis Coding Follow-up Appointments Return Appointment in 1 week. - Friday Dressing Change Frequency Wound #1 Right,Lateral Lower Leg Do not change entire dressing for one week. Skin Barriers/Peri-Wound Care Moisturizing lotion Wound Cleansing May shower with protection. - use a cast protector. can get at [facility] or [facility] Primary Wound Dressing Wound #1 Right,Lateral Lower Leg Iodoflex Wound #2 Right,Medial Lower Leg Iodoflex Secondary Dressing Dry Gauze ABD pad Edema Control 3 Layer Compression System - Right Lower Extremity Avoid standing for long periods of time Elevate legs to the level of the heart or above for 30 minutes daily and/or when  sitting, a frequency of: Exercise regularly Electronic Signature(s) Signed: 04/19/2019 6:29:59 PM By: Linton Ham MD Signed: 04/22/2019 1:40:10 PM By: Kela Millin Entered By: Kela Millin on 04/19/2019 12:09:51 -------------------------------------------------------------------------------- Problem List Details Patient Name: Date of Service: [name]. 04/19/2019 10:30 AM Medical Record [id number] Patient Account Number: [id number] Date of Birth/Sex: 06-28-1934 (84 y.o. Female) Treating RN: Kela Millin Primary Care Provider: [name] Other Clinician: Referring Provider: Treating Provider/Extender:Jazmine Longshore, Vista Lawman, Moses Manners in Treatment: 0 Active Problems ICD-10 Evaluated  Encounter Code Description Active Date Today Diagnosis S81.811D Laceration without foreign body, right lower leg, 04/19/2019 No Yes subsequent encounter L97.812 Non-pressure chronic ulcer of other part of right lower 04/19/2019 No Yes leg with fat layer exposed I87.311 Chronic venous hypertension (idiopathic) with ulcer of 04/19/2019 No Yes right lower extremity Z92.241 Personal history of systemic steroid therapy 04/19/2019 No Yes Inactive Problems Resolved Problems Electronic Signature(s) Signed: 04/19/2019 6:29:59 PM By: Linton Ham MD Entered By: Linton Ham on 04/19/2019 13:05:01 -------------------------------------------------------------------------------- Progress Note Details Patient Name: Date of Service: [name]. 04/19/2019 10:30 AM Medical Record [id number] Patient Account Number: [id number] Date of Birth/Sex: Treating RN: Oct 02, 1934 (85 y.o. Female) Kela Millin Primary Care Provider: [name] Other Clinician: Referring Provider: Treating Provider/Extender:Rajat Staver, Vista Lawman, Moses Manners in Treatment: 0 Subjective Chief Complaint Information obtained from Patient 04/19/2019; patient is here for review of a laceration injury x2 on her right lower extremity History of Present Illness (HPI) ADMISSION 04/19/2019 This is an 85 year old woman who has chronic interstitial lung disease on prednisone 10 mg a day. Towards the end of December she hit her lateral right leg on a chair. She was seen in urgent care and the area was Steri-Stripped however this did not hold together. Sometime after this traumatized the medial part of her right leg. She has some chronic edema in her lower legs probably is a secondary phenomenon to chronic venous insufficiency. She also takes chronic steroids. He saw her primary physician who is Dr. [name] at [place] and was given doxycycline on 2/8 I believe she has completed that. She is here for review of nonhealing  areas on the right lateral greater than right medial calf Past medical history; includes interstitial lung disease on prednisone 10 mg and is just started Ofev. She has a history of basal cell skin cancer, hyperlipidemia. ABI in our clinic was 1.04 in the right Patient History Information obtained from Patient. Allergies No Known Allergies Family History Cancer - Siblings, Stroke - Siblings, No family history of Diabetes, Heart Disease, Hereditary Spherocytosis, Hypertension, Kidney Disease, Lung Disease, Seizures, Thyroid Problems, Tuberculosis. Social History Former smoker, Marital Status - Married, Alcohol Use - Daily - 1-2 martini per day, Drug Use - No History, Caffeine Use - Daily - coffee. Medical History Eyes Patient has history of Cataracts - bil removed Denies history of Glaucoma, Optic Neuritis Integumentary (Skin) Denies history of History of Burn Oncologic Denies history of Received Chemotherapy, Received Radiation Psychiatric Patient has history of Confinement Anxiety Denies history of Anorexia/bulimia Hospitalization/Surgery History - tonsillectomy. - breast biopsy. Medical And Surgical History Notes Respiratory pulmonary fibrosis Cardiovascular hyperlipidemia Gastrointestinal constipation Review of Systems (ROS) Constitutional Symptoms (General Health) Denies complaints or symptoms of Fatigue, Fever, Chills, Marked Weight Change. Eyes Complains or has symptoms of Glasses / Contacts - glasses. Denies complaints or symptoms of Dry Eyes, Vision Changes. Ear/Nose/Mouth/Throat Denies complaints or symptoms of Chronic sinus problems or rhinitis. Respiratory Complains or has symptoms of Shortness of Breath. Denies complaints or symptoms of  Chronic or frequent coughs. Cardiovascular Denies complaints or symptoms of Chest pain. Gastrointestinal Denies complaints or symptoms of Frequent diarrhea, Nausea, Vomiting. Endocrine Denies complaints or symptoms of  Heat/cold intolerance. Genitourinary Denies complaints or symptoms of Frequent urination. Integumentary (Skin) Complains or has symptoms of Wounds - right lower leg. Musculoskeletal Denies complaints or symptoms of Muscle Pain, Muscle Weakness. Neurologic Denies complaints or symptoms of Numbness/parasthesias. Psychiatric Denies complaints or symptoms of Claustrophobia, Suicidal. Objective Constitutional Sitting or standing Blood Pressure is within target range for patient.. Pulse regular and within target range for patient.[name] Respirations regular, non-labored and within target range.. Temperature is normal and within the target range for the patient.[name] Appears in no distress. Vitals Time Taken: 10:52 AM, Height: 64 in, Source: Stated, Weight: 125 lbs, Source: Stated, BMI: 21.5, Temperature: 97.8 F, Pulse: 89 bpm, Respiratory Rate: 18 breaths/min, Blood Pressure: 123/88 mmHg. Respiratory work of breathing is normal. Coarse crackles at both lung bases. Cardiovascular Heart rhythm and rate regular, without murmur or gallop. No evidence of CHF. Her posterior tibial pulses palpable dorsalis pedis pulses somewhat reduced but still palpable on the right. Some edema bilaterally.[name] Psychiatric appears at normal baseline. General Notes: Wound exam ooThe major wound is a small area on the right lateral lower calf. 90% covered by very adherent fibrinous surface debris. She has a smaller area on the right medial mid calf also 100% covered by nonviable surface. Using a #5 curette both areas were debrided. Hemostasis with direct pressure. No evidence of surrounding infection. ooNotable for the fact that the patient appears to have Raynaud's type skin changes in her dorsal feet and toes. There is delayed capillary refill time Integumentary (Hair, Skin) No systemic rashes seen. Wound #1 status is Open. Original cause of wound was Trauma. The wound is located on the Right,Lateral Lower Leg. The  wound measures 1.4cm length x 2.7cm width x 0.1cm depth; 2.969cm^2 area and 0.297cm^3 volume. There is Fat Layer (Subcutaneous Tissue) Exposed exposed. There is no tunneling or undermining noted. There is a small amount of serosanguineous drainage noted. The wound margin is flat and intact. There is small (1-33%) red granulation within the wound bed. There is a large (67-100%) amount of necrotic tissue within the wound bed including Adherent Slough. Wound #2 status is Open. Original cause of wound was Trauma. The wound is located on the Right,Medial Lower Leg. The wound measures 0.6cm length x 0.5cm width x 0.1cm depth; 0.236cm^2 area and 0.024cm^3 volume. There is Fat Layer (Subcutaneous Tissue) Exposed exposed. There is no tunneling or undermining noted. There is a none present amount of drainage noted. The wound margin is flat and intact. There is large (67-100%) red granulation within the wound bed. There is a small (1-33%) amount of necrotic tissue within the wound bed including Adherent Slough. Assessment Active Problems ICD-10 Minor laceration of femoral artery, right leg, subsequent encounter Non-pressure chronic ulcer of other part of right lower leg with fat layer exposed Chronic venous hypertension (idiopathic) with ulcer of right lower extremity Personal history of systemic steroid therapy Procedures Wound #1 Pre-procedure diagnosis of Wound #1 is a Venous Leg Ulcer located on the Right,Lateral Lower Leg .Severity of Tissue Pre Debridement is: Fat layer exposed. There was a Excisional Skin/Subcutaneous Tissue Debridement with a total area of 3.78 sq cm performed by Ricard Dillon., MD. With the following instrument(s): Curette to remove Viable and Non-Viable tissue/material. Material removed includes Subcutaneous Tissue and Slough and after achieving pain control using Other (benzocaine, 20%). No specimens  were taken. A time out was conducted at 12:00, prior to the start of  the procedure. A Minimum amount of bleeding was controlled with Pressure. The procedure was tolerated well with a pain level of 0 throughout and a pain level of 0 following the procedure. Post Debridement Measurements: 1.4cm length x 2.7cm width x 0.1cm depth; 0.297cm^3 volume. Character of Wound/Ulcer Post Debridement is improved. Severity of Tissue Post Debridement is: Fat layer exposed. Post procedure Diagnosis Wound #1: Same as Pre-Procedure Pre-procedure diagnosis of Wound #1 is a Venous Leg Ulcer located on the Right,Lateral Lower Leg . There was a Three Layer Compression Therapy Procedure by Deon Pilling, RN. Post procedure Diagnosis Wound #1: Same as Pre-Procedure Wound #2 Pre-procedure diagnosis of Wound #2 is a Venous Leg Ulcer located on the Right,Medial Lower Leg .Severity of Tissue Pre Debridement is: Fat layer exposed. There was a Excisional Skin/Subcutaneous Tissue Debridement with a total area of 0.3 sq cm performed by Ricard Dillon., MD. With the following instrument(s): Curette to remove Viable and Non-Viable tissue/material. Material removed includes Subcutaneous Tissue and Slough and after achieving pain control using Other (benzocaine, 20%). No specimens were taken. A time out was conducted at 12:00, prior to the start of the procedure. A Minimum amount of bleeding was controlled with Pressure. The procedure was tolerated well with a pain level of 0 throughout and a pain level of 0 following the procedure. Post Debridement Measurements: 0.6cm length x 0.5cm width x 0.1cm depth; 0.024cm^3 volume. Character of Wound/Ulcer Post Debridement is improved. Severity of Tissue Post Debridement is: Fat layer exposed. Post procedure Diagnosis Wound #2: Same as Pre-Procedure Pre-procedure diagnosis of Wound #2 is a Venous Leg Ulcer located on the Right,Medial Lower Leg . There was a Three Layer Compression Therapy Procedure by Deon Pilling, RN. Post procedure Diagnosis Wound #2:  Same as Pre-Procedure Plan Follow-up Appointments: Return Appointment in 1 week. - Friday Dressing Change Frequency: Wound #1 Right,Lateral Lower Leg: Do not change entire dressing for one week. Skin Barriers/Peri-Wound Care: Moisturizing lotion Wound Cleansing: May shower with protection. - use a cast protector. can get at [facility] Primary Wound Dressing: Wound #1 Right,Lateral Lower Leg: Iodoflex Wound #2 Right,Medial Lower Leg: Iodoflex Secondary Dressing: Dry Gauze ABD pad Edema Control: 3 Layer Compression System - Right Lower Extremity Avoid standing for long periods of time Elevate legs to the level of the heart or above for 30 minutes daily and/or when sitting, a frequency of: Exercise regularly 1. We used Iodoflex is the primary dressing 2. I put her in 3 layer compression 3. We will see how the surface of this wound looks next week when she is here. Major reason for nonhealing was a nonviable surface and some degree of chronic edema probably mostly related to chronic venous insufficiency. I could see no evidence of systemic fluid volume overload. I spent 30 minutes on review of this patient's record both I provided by her primary doctor, epic, face-to-face evaluation the patient and preparation of this record Electronic Signature(s) Signed: 04/19/2019 6:29:59 PM By: Linton Ham MD Entered By: Linton Ham on 04/19/2019 13:03:55 -------------------------------------------------------------------------------- HxROS Details Patient Name: Date of Service: [name]. 04/19/2019 10:30 AM Medical Record [id number] Patient Account Number: [id number] Date of Birth/Sex: Treating RN: [birth year] (85 y.o. Female) Baruch Gouty Primary Care Provider: [name] Other Clinician: Referring Provider: Treating Provider/Extender:Haleem Hanner, Vista Lawman, Moses Manners in Treatment: 0 Information Obtained From Patient Constitutional Symptoms (General  Health) Complaints and Symptoms: Negative for: Fatigue;  Fever; Chills; Marked Weight Change Eyes Complaints and Symptoms: Positive for: Glasses / Contacts - glasses Negative for: Dry Eyes; Vision Changes Medical History: Positive for: Cataracts - bil removed Negative for: Glaucoma; Optic Neuritis Ear/Nose/Mouth/Throat Complaints and Symptoms: Negative for: Chronic sinus problems or rhinitis Respiratory Complaints and Symptoms: Positive for: Shortness of Breath Negative for: Chronic or frequent coughs Medical History: Past Medical History Notes: pulmonary fibrosis Cardiovascular Complaints and Symptoms: Negative for: Chest pain Medical History: Past Medical History Notes: hyperlipidemia Gastrointestinal Complaints and Symptoms: Negative for: Frequent diarrhea; Nausea; Vomiting Medical History: Past Medical History Notes: constipation Endocrine Complaints and Symptoms: Negative for: Heat/cold intolerance Genitourinary Complaints and Symptoms: Negative for: Frequent urination Integumentary (Skin) Complaints and Symptoms: Positive for: Wounds - right lower leg Medical History: Negative for: History of Burn Musculoskeletal Complaints and Symptoms: Negative for: Muscle Pain; Muscle Weakness Neurologic Complaints and Symptoms: Negative for: Numbness/parasthesias Psychiatric Complaints and Symptoms: Negative for: Claustrophobia; Suicidal Medical History: Positive for: Confinement Anxiety Negative for: Anorexia/bulimia Hematologic/Lymphatic Immunological Oncologic Medical History: Negative for: Received Chemotherapy; Received Radiation HBO Extended History Items Eyes: Cataracts Immunizations Pneumococcal Vaccine: Received Pneumococcal Vaccination: Yes Implantable Devices None Hospitalization / Surgery History Type of Hospitalization/Surgery tonsillectomy breast biopsy Family and Social History Cancer: Yes - Siblings; Diabetes: No; Heart Disease: No;  Hereditary Spherocytosis: No; Hypertension: No; Kidney Disease: No; Lung Disease: No; Seizures: No; Stroke: Yes - Siblings; Thyroid Problems: No; Tuberculosis: No; Former smoker; Marital Status - Married; Alcohol Use: Daily - 1-2 martini per day; Drug Use: No History; Caffeine Use: Daily - coffee; Financial Concerns: No; Food, Clothing or Shelter Needs: No; Support System Lacking: No; Transportation Concerns: No Electronic Signature(s) Signed: 04/19/2019 6:14:01 PM By: Baruch Gouty RN, BSN Signed: 04/19/2019 6:29:59 PM By: Linton Ham MD Entered By: Baruch Gouty on 04/19/2019 11:05:22 -------------------------------------------------------------------------------- SuperBill Details Patient Name: Date of Service: [name] 04/19/2019 Medical Record [id number] Patient Account Number: [id number] Date of Birth/Sex: Treating RN: [birth year] (85 y.o. Female) Kela Millin Primary Care Provider: [name] Other Clinician: Referring Provider: Treating Provider/Extender:Acey Woodfield, Vista Lawman, Moses Manners in Treatment: 0 Diagnosis Coding ICD-10 Codes Code Description [phone] Laceration without foreign body, right lower leg, subsequent encounter O45.997 Non-pressure chronic ulcer of other part of right lower leg with fat layer exposed I87.311 Chronic venous hypertension (idiopathic) with ulcer of right lower extremity Z92.241 Personal history of systemic steroid therapy Facility Procedures CPT4 Code Description: [id number] [facility] VISIT-LEV 3 EST PT Modifier: 25 Quantity: 1 CPT4 Code Description: [id number] [facility] - DEB SUBQ TISSUE 20 SQ CM/< ICD-10 Diagnosis Description H68.616 Non-pressure chronic ulcer of other part of right lower leg Modifier: with fat layer Quantity: 1 exposed Physician Procedures CPT4 Code Description: [id number] [place] PHYS LEVEL 3 NEW PT ICD-10 Diagnosis Description S81.811D Laceration without foreign body, right lower leg, subseq L97.812  Non-pressure chronic ulcer of other part of right lower I87.311 Chronic venous hypertension  (idiopathic) with ulcer of r Z92.241 Personal history of systemic steroid therapy Modifier: 25 uent encounter leg with fat lay ight lower extre Quantity: 1 er exposed mity CPT4 Code Description: 1115520 11042 - WC PHYS SUBQ TISS 20 SQ CM ICD-10 Diagnosis Description E02.233 Non-pressure chronic ulcer of other part of right lower leg Modifier: with fat layer Quantity: 1 exposed Electronic Signature(s) Signed: 04/19/2019 6:29:59 PM By: Linton Ham MD Entered By: Linton Ham on 04/19/2019 13:05:26

## 2019-04-24 ENCOUNTER — Encounter (HOSPITAL_BASED_OUTPATIENT_CLINIC_OR_DEPARTMENT_OTHER): Payer: Medicare HMO | Admitting: Physician Assistant

## 2019-04-26 ENCOUNTER — Encounter (HOSPITAL_BASED_OUTPATIENT_CLINIC_OR_DEPARTMENT_OTHER): Payer: Medicare HMO | Admitting: Internal Medicine

## 2019-04-26 ENCOUNTER — Other Ambulatory Visit: Payer: Self-pay

## 2019-04-26 DIAGNOSIS — Z85828 Personal history of other malignant neoplasm of skin: Secondary | ICD-10-CM | POA: Diagnosis not present

## 2019-04-26 DIAGNOSIS — Z92241 Personal history of systemic steroid therapy: Secondary | ICD-10-CM | POA: Diagnosis not present

## 2019-04-26 DIAGNOSIS — I872 Venous insufficiency (chronic) (peripheral): Secondary | ICD-10-CM | POA: Diagnosis not present

## 2019-04-26 DIAGNOSIS — L97212 Non-pressure chronic ulcer of right calf with fat layer exposed: Secondary | ICD-10-CM | POA: Diagnosis not present

## 2019-04-26 DIAGNOSIS — X58XXXD Exposure to other specified factors, subsequent encounter: Secondary | ICD-10-CM | POA: Diagnosis not present

## 2019-04-26 DIAGNOSIS — L97812 Non-pressure chronic ulcer of other part of right lower leg with fat layer exposed: Secondary | ICD-10-CM | POA: Diagnosis not present

## 2019-04-26 DIAGNOSIS — J849 Interstitial pulmonary disease, unspecified: Secondary | ICD-10-CM | POA: Diagnosis not present

## 2019-04-26 DIAGNOSIS — I10 Essential (primary) hypertension: Secondary | ICD-10-CM | POA: Diagnosis not present

## 2019-04-26 DIAGNOSIS — S81811D Laceration without foreign body, right lower leg, subsequent encounter: Secondary | ICD-10-CM | POA: Diagnosis not present

## 2019-04-26 DIAGNOSIS — I87311 Chronic venous hypertension (idiopathic) with ulcer of right lower extremity: Secondary | ICD-10-CM | POA: Diagnosis not present

## 2019-04-26 DIAGNOSIS — Z7952 Long term (current) use of systemic steroids: Secondary | ICD-10-CM | POA: Diagnosis not present

## 2019-04-26 NOTE — Progress Notes (Signed)
[name], [name] ([id number]) Visit Report for 04/26/2019 Debridement Details Patient Name: Date of Service: [name], [name] 04/26/2019 10:30 AM Medical Record [id number] Patient Account Number: [id number] Date of Birth/Sex: Treating RN: 1934/05/12 (85 y.o. [name] Primary Care Provider: Lavone Conway Other Clinician: Referring Provider: Treating Provider/Extender:[name], [name], [name] in Treatment: 1 Debridement Performed for Wound #1 Right,Lateral Lower Leg Assessment: Performed By: Physician [name]., MD Debridement Type: Debridement Severity of Tissue Pre Fat layer exposed Debridement: Level of Consciousness (Pre- Awake and Alert procedure): Pre-procedure Verification/Time Out Taken: Yes - 11:30 Start Time: 11:30 Pain Control: Other : benzocaine, 20% Total Area Debrided (L x W): 1.5 (cm) x 2.6 (cm) = 3.9 (cm) Tissue and other material Viable, Non-Viable, Slough, Subcutaneous, Slough debrided: Level: Skin/Subcutaneous Tissue Debridement Description: Excisional Instrument: Curette Bleeding: Minimum Hemostasis Achieved: Pressure End Time: 11:30 Procedural Pain: 0 Post Procedural Pain: 0 Response to Treatment: Procedure was tolerated well Level of Consciousness Awake and Alert (Post-procedure): Post Debridement Measurements of Total Wound Length: (cm) 1.5 Width: (cm) 2.6 Depth: (cm) 0.2 Volume: (cm) 0.613 Character of Wound/Ulcer Post Improved Debridement: Severity of Tissue Post Debridement: Fat layer exposed Post Procedure Diagnosis Same as Pre-procedure Electronic Signature(s) Signed: 04/26/2019 5:17:01 PM By: [name] Signed: 04/26/2019 5:26:28 PM By: Terry Ham MD Entered By: [name] on 04/26/2019 11:55:04 -------------------------------------------------------------------------------- Debridement Details Patient Name: Date of Service: [name]. 04/26/2019 10:30 AM Medical Record  [id number] Patient Account Number: [id number] Date of Birth/Sex: Treating RN: 08/17/1934 (85 y.o. [name] Primary Care Provider: Lavone Conway Other Clinician: Referring Provider: Treating Provider/Extender:[name], [name], [name] in Treatment: 1 Debridement Performed for Wound #2 Right,Medial Lower Leg Assessment: Performed By: Physician [name]., MD Debridement Type: Debridement Severity of Tissue Pre Fat layer exposed Debridement: Level of Consciousness (Pre- Awake and Alert procedure): Pre-procedure Yes - 11:30 Verification/Time Out Taken: Start Time: 11:30 Pain Control: Other : benzocaine, 20% Total Area Debrided (L x W): 0.7 (cm) x 0.5 (cm) = 0.35 (cm) Tissue and other material Viable, Non-Viable, Eschar, Subcutaneous debrided: Level: Skin/Subcutaneous Tissue Debridement Description: Excisional Instrument: Curette Bleeding: Minimum Hemostasis Achieved: Pressure End Time: 11:30 Procedural Pain: 0 Post Procedural Pain: 0 Response to Treatment: Procedure was tolerated well Level of Consciousness Awake and Alert (Post-procedure): Post Debridement Measurements of Total Wound Length: (cm) 0.7 Width: (cm) 0.5 Depth: (cm) 0.1 Volume: (cm) 0.027 Character of Wound/Ulcer Post Improved Debridement: Severity of Tissue Post Debridement: Fat layer exposed Post Procedure Diagnosis Same as Pre-procedure Electronic Signature(s) Signed: 04/26/2019 5:17:01 PM By: [name] Signed: 04/26/2019 5:26:28 PM By: Terry Ham MD Entered By: [name] on 04/26/2019 11:55:14 -------------------------------------------------------------------------------- HPI Details Patient Name: Date of Service: [name]. 04/26/2019 10:30 AM Medical Record [id number] Patient Account Number: [id number] Date of Birth/Sex: Treating RN: [birth year] (85 y.o. [name] Primary Care Provider: Lavone Conway Other  Clinician: Referring Provider: Treating Provider/Extender:[name], [name], [name] in Treatment: 1 History of Present Illness HPI Description: ADMISSION 04/19/2019 This is an 85 year old woman who has chronic interstitial lung disease on prednisone 10 mg a day. Towards the end of December she hit her lateral right leg on a chair. She was seen in urgent care and the area was Steri-Stripped however this did not hold together. Sometime after this traumatized the medial part of her right leg. She has some chronic edema in her lower legs probably is a secondary phenomenon to chronic venous insufficiency. She also takes chronic steroids. He saw her primary  physician who is [name] at [name] and was given doxycycline on 2/8 I believe she has completed that. She is here for review of nonhealing areas on the right lateral greater than right medial calf Past medical history; includes interstitial lung disease on prednisone 10 mg and is just started [name]. She has a history of basal cell skin cancer, hyperlipidemia. ABI in our clinic was 1.04 in the right 2/26; patient with chronic venous insufficiency and interstitial lung disease on chronic prednisone. She has a wound on her right lateral and right medial lower leg on the right which were separate incidents of trauma. These wounds started in December. She had very significant debris on both of the wounds last week which required debridement they require debridement again today. We have been using Iodoflex Electronic Signature(s) Signed: 04/26/2019 5:26:28 PM By: Terry Ham MD Entered By: [name] on 04/26/2019 11:56:21 -------------------------------------------------------------------------------- Physical Exam Details Patient Name: Date of Service: [name] 04/26/2019 10:30 AM Medical Record [id number] Patient Account Number: [id number] Date of Birth/Sex: Treating RN: 01-Jun-1934 (85 y.o. [name] Primary Care Provider: Lavone Conway Other Clinician: Referring Provider: Treating Provider/Extender:[name], [name], [name] in Treatment: 1 Constitutional Patient is hypertensive.. Pulse regular and within target range for patient.[name] Respirations regular, non-labored and within target range.. Temperature is normal and within the target range for the patient.[name] Appears in no distress. Notes Wound exam The major wound is a small area on the right lateral lower calf. Still requiring debridement although the surface of this looks some better. She has another area on the right medial calf which is smaller. Also debrided this looks a lot better. Electronic Signature(s) Signed: 04/26/2019 5:26:28 PM By: Terry Ham MD Entered By: [name] on 04/26/2019 11:57:17 -------------------------------------------------------------------------------- Physician Orders Details Patient Name: Date of Service: [name], [name] 04/26/2019 10:30 AM Medical Record [id number] Patient Account Number: [id number] Date of Birth/Sex: Treating RN: Nov 12, 1934 (85 y.o. [name], [name] Primary Care Provider: Lavone Conway Other Clinician: Referring Provider: Treating Provider/Extender:Dawn Convery, [name], [name] in Treatment: 1 Verbal / Phone Orders: No Diagnosis Coding ICD-10 Coding Code Description S81.811D Laceration without foreign body, right lower leg, subsequent encounter L97.812 Non-pressure chronic ulcer of other part of right lower leg with fat layer exposed I87.311 Chronic venous hypertension (idiopathic) with ulcer of right lower extremity Z92.241 Personal history of systemic steroid therapy Follow-up Appointments Return Appointment in 1 week. - Friday Dressing Change Frequency Wound #1 Right,Lateral Lower Leg Do not change entire dressing for one week. Skin Barriers/Peri-Wound Care Moisturizing lotion Wound Cleansing May shower with protection. -  use a cast protector. can get at [facility] or [facility] Primary Wound Dressing Wound #1 Right,Lateral Lower Leg Iodoflex Wound #2 Right,Medial Lower Leg Iodoflex Secondary Dressing Dry Gauze ABD pad Edema Control 3 Layer Compression System - Right Lower Extremity Avoid standing for long periods of time Elevate legs to the level of the heart or above for 30 minutes daily and/or when sitting, a frequency of: Exercise regularly Electronic Signature(s) Signed: 04/26/2019 5:17:01 PM By: [name] Signed: 04/26/2019 5:26:28 PM By: Terry Ham MD Entered By: [name] on 04/26/2019 11:35:56 -------------------------------------------------------------------------------- Problem List Details Patient Name: Date of Service: [name]. 04/26/2019 10:30 AM Medical Record [id number] Patient Account Number: [id number] Date of Birth/Sex: Treating RN: Jan 22, 1935 (85 y.o. [name] Primary Care Provider: Lavone Conway Other Clinician: Referring Provider: Treating Provider/Extender:Aylen Stradford, [name], [name] in Treatment: 1 Active Problems ICD-10 Evaluated Encounter Code Description Active Date  Today Diagnosis S81.811D Laceration without foreign body, right lower leg, 04/19/2019 No Yes subsequent encounter L97.812 Non-pressure chronic ulcer of other part of right lower 04/19/2019 No Yes leg with fat layer exposed I87.311 Chronic venous hypertension (idiopathic) with ulcer of 04/19/2019 No Yes right lower extremity Z92.241 Personal history of systemic steroid therapy 04/19/2019 No Yes Inactive Problems Resolved Problems Electronic Signature(s) Signed: 04/26/2019 5:26:28 PM By: Terry Ham MD Entered By: [name] on 04/26/2019 11:53:06 -------------------------------------------------------------------------------- Progress Note Details Patient Name: Date of Service: [name]. 04/26/2019 10:30 AM Medical Record [id number]  Patient Account Number: [id number] Date of Birth/Sex: Treating RN: 28-Mar-1934 (85 y.o. [name] Primary Care Provider: Lavone Conway Other Clinician: Referring Provider: Treating Provider/Extender:Keino Placencia, [name], [name] in Treatment: 1 Subjective History of Present Illness (HPI) ADMISSION 04/19/2019 This is an 85 year old woman who has chronic interstitial lung disease on prednisone 10 mg a day. Towards the end of December she hit her lateral right leg on a chair. She was seen in urgent care and the area was Steri-Stripped however this did not hold together. Sometime after this traumatized the medial part of her right leg. She has some chronic edema in her lower legs probably is a secondary phenomenon to chronic venous insufficiency. She also takes chronic steroids. He saw her primary physician who is [name] at [place] and was given doxycycline on 2/8 I believe she has completed that. She is here for review of nonhealing areas on the right lateral greater than right medial calf Past medical history; includes interstitial lung disease on prednisone 10 mg and is just started [name]. She has a history of basal cell skin cancer, hyperlipidemia. ABI in our clinic was 1.04 in the right 2/26; patient with chronic venous insufficiency and interstitial lung disease on chronic prednisone. She has a wound on her right lateral and right medial lower leg on the right which were separate incidents of trauma. These wounds started in December. She had very significant debris on both of the wounds last week which required debridement they require debridement again today. We have been using Iodoflex Objective Constitutional Patient is hypertensive.. Pulse regular and within target range for patient.[name] Respirations regular, non-labored and within target range.. Temperature is normal and within the target range for the patient.[name] Appears in no distress. Vitals Time Taken: 11:07  AM, Height: 64 in, Weight: 125 lbs, BMI: 21.5, Temperature: 97.8 F, Pulse: 93 bpm, Respiratory Rate: 20 breaths/[name], Blood Pressure: 154/89 mmHg. General Notes: Wound exam ooThe major wound is a small area on the right lateral lower calf. Still requiring debridement although the surface of this looks some better. ooShe has another area on the right medial calf which is smaller. Also debrided this looks a lot better. Integumentary (Hair, Skin) Wound #1 status is Open. Original cause of wound was Trauma. The wound is located on the Right,Lateral Lower Leg. The wound measures 1.5cm length x 2.6cm width x 0.2cm depth; 3.063cm^2 area and 0.613cm^3 volume. There is Fat Layer (Subcutaneous Tissue) Exposed exposed. There is no tunneling or undermining noted. There is a small amount of serosanguineous drainage noted. The wound margin is flat and intact. There is medium (34-66%) red granulation within the wound bed. There is a medium (34-66%) amount of necrotic tissue within the wound bed including Adherent Slough. Wound #2 status is Open. Original cause of wound was Trauma. The wound is located on the Right,Medial Lower Leg. The wound measures 0.7cm length x 0.5cm width x 0.1cm depth; 0.275cm^2 area  and 0.027cm^3 volume. There is Fat Layer (Subcutaneous Tissue) Exposed exposed. There is no tunneling or undermining noted. There is a small amount of serosanguineous drainage noted. The wound margin is flat and intact. There is large (67-100%) red granulation within the wound bed. There is a small (1-33%) amount of necrotic tissue within the wound bed including Adherent Slough. Assessment Active Problems ICD-10 Laceration without foreign body, right lower leg, subsequent encounter Non-pressure chronic ulcer of other part of right lower leg with fat layer exposed Chronic venous hypertension (idiopathic) with ulcer of right lower extremity Personal history of systemic steroid therapy Procedures Wound  #1 Pre-procedure diagnosis of Wound #1 is a Venous Leg Ulcer located on the Right,Lateral Lower Leg .Severity of Tissue Pre Debridement is: Fat layer exposed. There was a Excisional Skin/Subcutaneous Tissue Debridement with a total area of 3.9 sq cm performed by [name]., MD. With the following instrument(s): Curette to remove Viable and Non-Viable tissue/material. Material removed includes Subcutaneous Tissue and Slough and after achieving pain control using Other (benzocaine, 20%). No specimens were taken. A time out was conducted at 11:30, prior to the start of the procedure. A Minimum amount of bleeding was controlled with Pressure. The procedure was tolerated well with a pain level of 0 throughout and a pain level of 0 following the procedure. Post Debridement Measurements: 1.5cm length x 2.6cm width x 0.2cm depth; 0.613cm^3 volume. Character of Wound/Ulcer Post Debridement is improved. Severity of Tissue Post Debridement is: Fat layer exposed. Post procedure Diagnosis Wound #1: Same as Pre-Procedure Pre-procedure diagnosis of Wound #1 is a Venous Leg Ulcer located on the Right,Lateral Lower Leg . There was a Three Layer Compression Therapy Procedure by Deon Pilling, RN. Post procedure Diagnosis Wound #1: Same as Pre-Procedure Wound #2 Pre-procedure diagnosis of Wound #2 is a Venous Leg Ulcer located on the Right,Medial Lower Leg .Severity of Tissue Pre Debridement is: Fat layer exposed. There was a Excisional Skin/Subcutaneous Tissue Debridement with a total area of 0.35 sq cm performed by [name]., MD. With the following instrument(s): Curette to remove Viable and Non-Viable tissue/material. Material removed includes Eschar and Subcutaneous Tissue and after achieving pain control using Other (benzocaine, 20%). No specimens were taken. A time out was conducted at 11:30, prior to the start of the procedure. A Minimum amount of bleeding was controlled with Pressure.  The procedure was tolerated well with a pain level of 0 throughout and a pain level of 0 following the procedure. Post Debridement Measurements: 0.7cm length x 0.5cm width x 0.1cm depth; 0.027cm^3 volume. Character of Wound/Ulcer Post Debridement is improved. Severity of Tissue Post Debridement is: Fat layer exposed. Post procedure Diagnosis Wound #2: Same as Pre-Procedure Pre-procedure diagnosis of Wound #2 is a Venous Leg Ulcer located on the Right,Medial Lower Leg . There was a Three Layer Compression Therapy Procedure by Deon Pilling, RN. Post procedure Diagnosis Wound #2: Same as Pre-Procedure Plan Follow-up Appointments: Return Appointment in 1 week. - Friday Dressing Change Frequency: Wound #1 Right,Lateral Lower Leg: Do not change entire dressing for one week. Skin Barriers/Peri-Wound Care: Moisturizing lotion Wound Cleansing: May shower with protection. - use a cast protector. can get [facility] Primary Wound Dressing: Wound #1 Right,Lateral Lower Leg: Iodoflex Wound #2 Right,Medial Lower Leg: Iodoflex Secondary Dressing: Dry Gauze ABD pad Edema Control: 3 Layer Compression System - Right Lower Extremity Avoid standing for long periods of time Elevate legs to the level of the heart or above for 30 minutes daily  and/or when sitting, a frequency of: Exercise regularly 1. I am still going to use Iodoflex 3 layer compression but quite possibly change this next week as the wound surface improves Electronic Signature(s) Signed: 04/26/2019 5:26:28 PM By: Terry Ham MD Entered By: [name] on 04/26/2019 11:58:27 -------------------------------------------------------------------------------- SuperBill Details Patient Name: Date of Service: [name] 04/26/2019 Medical Record [id number] Patient Account Number: [id number] Date of Birth/Sex: Treating RN: 02-Oct-1934 (85 y.o. [name] Primary Care Provider: Lavone Conway Other  Clinician: Referring Provider: Treating Provider/Extender:Khalil Szczepanik, [name], [name] in Treatment: 1 Diagnosis Coding ICD-10 Codes Code Description [id number] Laceration without foreign body, right lower leg, subsequent encounter L97.812 Non-pressure chronic ulcer of other part of right lower leg with fat layer exposed I87.311 Chronic venous hypertension (idiopathic) with ulcer of right lower extremity Z92.241 Personal history of systemic steroid therapy Facility Procedures CPT4 Code Description: [id number] 11042 - DEB SUBQ TISSUE 20 SQ CM/< ICD-10 Diagnosis Description W46.659 Non-pressure chronic ulcer of other part of right lower leg I87.311 Chronic venous hypertension (idiopathic) with ulcer of righ Modifier: with fat lay t lower extre Quantity: 1 er exposed mity Physician Procedures CPT4 Code Description: [id number] 79390 - WC PHYS SUBQ TISS 20 SQ CM ICD-10 Diagnosis Description Z00.923 Non-pressure chronic ulcer of other part of right lower leg I87.311 Chronic venous hypertension (idiopathic) with ulcer of righ Modifier: with fat lay t lower extre 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

## 2019-04-26 NOTE — Progress Notes (Signed)
Terry, Conway ([id number]) Visit Report for 04/26/2019 Arrival Information Details Patient Name: Date of Service: Terry, Conway 04/26/2019 10:30 AM Medical Record [id number] Patient Account Number: [id number] Date of Birth/Sex: Treating RN: Jun 09, 1934 (85 y.o. [name], [name] Primary Care Media Pizzini: [name] Other Clinician: Referring [name]: Treating [name]/Extender:[name], [name], [name] in Treatment: 1 Visit Information History Since Last Visit Added or deleted any medications: No Patient Arrived: Ambulatory Any new allergies or adverse reactions: No Arrival Time: 11:06 Had a fall or experienced change in No Accompanied By: self activities of daily living that may affect Transfer Assistance: None risk of falls: Patient Identification Verified: Yes Signs or symptoms of abuse/neglect since last No Secondary Verification Process Completed: Yes visito Patient Requires Transmission-Based No Hospitalized since last visit: No Precautions: Implantable device outside of the clinic excluding No Patient Has Alerts: No cellular tissue based products placed in the center since last visit: Has Dressing in Place as Prescribed: Yes Has Compression in Place as Prescribed: Yes Pain Present Now: No Electronic Signature(s) Signed: 04/26/2019 4:47:37 PM By: Deon Pilling Entered By: Deon Pilling on 04/26/2019 11:07:12 -------------------------------------------------------------------------------- Compression Therapy Details Patient Name: Date of Service: Terry, Conway 04/26/2019 10:30 AM Medical Record [id number] Patient Account Number: [id number] Date of Birth/Sex: Treating RN: 1934/04/14 (85 y.o. [name] Primary Care [name]: [name] Other Clinician: Referring Lucius Wise: Treating [place] Emmitt/Extender:[name], [name], [name] in Treatment: 1 Compression Therapy Performed for Wound Wound #1 Right,Lateral Lower  Leg Assessment: Performed By: Clinician Deon Pilling, RN Compression Type: Three Layer Post Procedure Diagnosis Same as Pre-procedure Electronic Signature(s) Signed: 04/26/2019 5:17:01 PM By: Kela Millin Entered By: Kela Millin on 04/26/2019 11:32:59 -------------------------------------------------------------------------------- Compression Therapy Details Patient Name: Date of Service: Terry, Conway 04/26/2019 10:30 AM Medical Record [id number] Patient Account Number: [id number] Date of Birth/Sex: Treating RN: 10/08/1934 (85 y.o. [name] Primary Care Alem Fahl: [name] Other Clinician: Referring Zarin Knupp: Treating Marlow Berenguer/Extender:[name], [name], [name] in Treatment: 1 Compression Therapy Performed for Wound Wound #2 Right,Medial Lower Leg Assessment: Performed By: Clinician Deon Pilling, RN Compression Type: Three Layer Post Procedure Diagnosis Same as Pre-procedure Electronic Signature(s) Signed: 04/26/2019 5:17:01 PM By: Kela Millin Entered By: Kela Millin on 04/26/2019 11:32:59 -------------------------------------------------------------------------------- Lower Extremity Assessment Details Patient Name: Date of Service: [name] 04/26/2019 10:30 AM Medical Record [id number] Patient Account Number: [id number] Date of Birth/Sex: Treating RN: December 24, 1934 (85 y.o. [name], [name] Primary Care Roshini Fulwider: [name] Other Clinician: Referring [name]: Treating [name]/Extender:[name], [name], [name] in Treatment: 1 Edema Assessment Assessed: [Left: No] [Right: Yes] Edema: [Left: Ye] [Right: s] Calf Left: Right: Point of Measurement: cm From Medial Instep cm 30.5 cm Ankle Left: Right: Point of Measurement: cm From Medial Instep cm 21.5 cm Vascular Assessment Pulses: Dorsalis Pedis Palpable: [Right:Yes] Electronic Signature(s) Signed: 04/26/2019 4:47:37 PM By: Deon Pilling Entered By: Deon Pilling on 04/26/2019 11:13:34 -------------------------------------------------------------------------------- Multi Wound Chart Details Patient Name: Date of Service: [name]. 04/26/2019 10:30 AM Medical Record [id number] Patient Account Number: [id number] Date of Birth/Sex: Treating RN: [birth year] (85 y.o. [name] Primary Care [name]: [name] Other Clinician: Referring [name]: Treating [name]/Extender:[name], [name], [name] in Treatment: 1 Vital Signs Height(in): 64 Pulse(bpm): 90 Weight(lbs): 125 Blood Pressure(mmHg): 154/89 Body Mass Index(BMI): 21 Temperature(F): 97.8 Respiratory 20 Rate(breaths/min): Photos: [1:No Photos] [2:No Photos] [N/A:N/A] Wound Location: [1:Right Lower Leg - Lateral Right Lower Leg - Medial N/A] Wounding Event: [1:Trauma] [2:Trauma] [N/A:N/A] Primary Etiology: [1:Venous Leg Ulcer] [2:Venous Leg Ulcer] [N/A:N/A]  Comorbid History: [1:Cataracts, Confinement Anxiety] [2:Cataracts, Confinement Anxiety] [N/A:N/A] Date Acquired: [1:02/27/2019] [2:02/17/2019] [N/A:N/A] Weeks of Treatment: [1:1] [2:1] [N/A:N/A] Wound Status: [1:Open] [2:Open] [N/A:N/A] Measurements L x W x D 1.5x2.6x0.2 [2:0.7x0.5x0.1] [N/A:N/A] (cm) Area (cm) : [1:3.063] [2:0.275] [N/A:N/A] Volume (cm) : [1:0.613] [2:0.027] [N/A:N/A] % Reduction in Area: [1:-3.20%] [2:-16.50%] [N/A:N/A] % Reduction in Volume: -106.40% [2:-12.50%] [N/A:N/A] Classification: [1:Full Thickness Without Exposed Support Structures Exposed Support Structures] [2:Full Thickness Without] [N/A:N/A] Exudate Amount: [1:Small] [2:Small] [N/A:N/A] Exudate Type: [1:Serosanguineous] [2:Serosanguineous] [N/A:N/A] Exudate Color: [1:red, brown] [2:red, brown] [N/A:N/A] Wound Margin: [1:Flat and Intact] [2:Flat and Intact] [N/A:N/A] Granulation Amount: [1:Medium (34-66%)] [2:Large (67-100%)] [N/A:N/A] Granulation Quality: [1:Red] [2:Red]  [N/A:N/A] Necrotic Amount: [1:Medium (34-66%)] [2:Small (1-33%)] [N/A:N/A] Exposed Structures: [1:Fat Layer (Subcutaneous Tissue) Exposed: Yes Fascia: No Tendon: No Muscle: No Joint: No Bone: No] [2:Fat Layer (Subcutaneous Tissue) Exposed: Yes Fascia: No Tendon: No Muscle: No Joint: No Bone: No] [N/A:N/A] Epithelialization: [1:Small (1-33%)] [2:Small (1-33%)] [N/A:N/A] Debridement: [1:Debridement - Excisional] [2:Debridement - Excisional] [N/A:N/A] Pre-procedure [1:11:30] [2:11:30] [N/A:N/A] Verification/Time Out Taken: Pain Control: [1:Other] [2:Other] [N/A:N/A] Tissue Debrided: [1:Subcutaneous, Slough] [2:Necrotic/Eschar, Subcutaneous] [N/A:N/A] Level: [1:Skin/Subcutaneous Tissue Skin/Subcutaneous Tissue] [N/A:N/A] Debridement Area (sq cm):3.9 [2:0.35] [N/A:N/A] Instrument: [1:Curette] [2:Curette] [N/A:N/A] Bleeding: [1:Minimum] [2:Minimum] [N/A:N/A] Hemostasis Achieved: [1:Pressure] [2:Pressure] [N/A:N/A] Procedural Pain: [1:0] [2:0] [N/A:N/A] Post Procedural Pain: [1:0] [2:0] [N/A:N/A] Debridement Treatment Procedure was tolerated [2:Procedure was tolerated] [N/A:N/A] Response: [1:well] [2:well] Post Debridement [1:1.5x2.6x0.2] [2:0.7x0.5x0.1] [N/A:N/A] Measurements L x W x D (cm) Post Debridement [1:0.613] [2:0.027] [N/A:N/A] Volume: (cm) Procedures Performed: Compression Therapy [1:Debridement] [2:Compression Therapy Debridement] [N/A:N/A] Treatment Notes Electronic Signature(s) Signed: 04/26/2019 5:17:01 PM By: Kela Millin Signed: 04/26/2019 5:26:28 PM By: Linton Ham MD Entered By: Linton Ham on 04/26/2019 11:54:49 -------------------------------------------------------------------------------- Multi-Disciplinary Care Plan Details Patient Name: Date of Service: [name]. 04/26/2019 10:30 AM Medical Record [id number] Patient Account Number: [id number] Date of Birth/Sex: Treating RN: 1934/06/01 (85 y.o. [name] Primary Care Shyne Lehrke:  [name] Other Clinician: Referring [place] Mielke: Treating Zeta Bucy/Extender:[name], [name], [name] in Treatment: 1 Active Inactive Orientation to the Wound Care Program Nursing Diagnoses: Knowledge deficit related to the wound healing center program Goals: Patient/caregiver will verbalize understanding of the [place] Date Initiated: 04/19/2019 Target Resolution Date: 05/10/2019 Goal Status: Active Interventions: Provide education on orientation to the wound center Notes: Pain, Acute or Chronic Nursing Diagnoses: Pain, acute or chronic: actual or potential Goals: Patient/caregiver will verbalize adequate pain control between visits Date Initiated: 04/19/2019 Target Resolution Date: 05/10/2019 Goal Status: Active Interventions: Provide education on pain management Notes: Wound/Skin Impairment Nursing Diagnoses: Impaired tissue integrity Goals: Ulcer/skin breakdown will have a volume reduction of 30% by week 4 Date Initiated: 04/19/2019 Target Resolution Date: 05/10/2019 Goal Status: Active Interventions: Provide education on ulcer and skin care Notes: Electronic Signature(s) Signed: 04/26/2019 5:17:01 PM By: Kela Millin Entered By: Kela Millin on 04/26/2019 11:36:41 -------------------------------------------------------------------------------- Pain Assessment Details Patient Name: Date of Service: Terry, Conway 04/26/2019 10:30 AM Medical Record [id number] Patient Account Number: [id number] Date of Birth/Sex: Treating RN: [birth year] (85 y.o. [name] Primary Care Maysun Meditz: [name] Other Clinician: Referring Caius Silbernagel: Treating Michah Minton/Extender:[name], [name], [name] in Treatment: 1 Active Problems Location of Pain Severity and Description of Pain Patient Has Paino No Site Locations Rate the pain. Current Pain Level: 0 Pain Management and Medication Current Pain  Management: Medication: No Cold Application: No Rest: No Massage: No Activity: No T.E.N.S.: No Heat Application: No Leg drop or elevation: No Is the Current Pain Management Adequate: Adequate How does  your wound impact your activities of daily livingo Sleep: No Bathing: No Appetite: No Relationship With Others: No Bladder Continence: No Emotions: No Bowel Continence: No Work: No Toileting: No Drive: No Dressing: No Hobbies: No Electronic Signature(s) Signed: 04/26/2019 4:47:37 PM By: Deon Pilling Entered By: Deon Pilling on 04/26/2019 11:07:23 -------------------------------------------------------------------------------- Patient/Caregiver Education Details Patient Name: [name], Terry P. Date of Service: 2/26/2021andnbsp10:30 AM Medical Record [id number] Patient Account Number: [id number] Date of Birth/Gender: November 12, 1934 (85 y.o. F) Treating RN: Kela Millin Primary Care Physician: [name] Other Clinician: Referring Physician: Treating Physician/Extender:[name], [name], [name] in Treatment: 1 Education Assessment Education Provided To: Patient Education Topics Provided Pain: Methods: Explain/Verbal Responses: State content correctly Welcome To The [place]: Methods: Explain/Verbal Responses: State content correctly Wound/Skin Impairment: Methods: Explain/Verbal Responses: State content correctly Electronic Signature(s) Signed: 04/26/2019 5:17:01 PM By: Kela Millin Entered By: Kela Millin on 04/26/2019 11:36:31 -------------------------------------------------------------------------------- Wound Assessment Details Patient Name: Date of Service: Terry, Conway 04/26/2019 10:30 AM Medical Record [id number] Patient Account Number: [id number] Date of Birth/Sex: Treating RN: 1934-04-09 (85 y.o. [name] Primary Care Secilia Apps: [name] Other Clinician: Referring Neomi Laidler: Treating  Gurdeep Keesey/Extender:[name], [name], [name] in Treatment: 1 Wound Status Wound Number: 1 Primary Etiology: Venous Leg Ulcer Wound Location: Right Lower Leg - Lateral Wound Status: Open Wounding Event: Trauma Comorbid History: Cataracts, Confinement Anxiety Date Acquired: 02/27/2019 Weeks Of Treatment: 1 Clustered Wound: No Photos Wound Measurements Length: (cm) 1.5 % Reduc Width: (cm) 2.6 % Reduc Depth: (cm) 0.2 Epithel Area: (cm) 3.063 Tunnel Volume: (cm) 0.613 Underm Wound Description Full Thickness Without Exposed Support Foul O Classification: Structures Slough Wound Flat and Intact Margin: Exudate Small Amount: Exudate Serosanguineous Type: Exudate red, brown Color: Wound Bed Granulation Amount: Medium (34-66%) Granulation Quality: Red Fascia Necrotic Amount: Medium (34-66%) Fat La Necrotic Quality: Adherent Slough Tendon Muscle Joint Bone E dor After Cleansing: No /Fibrino No Exposed Structure Exposed: No yer (Subcutaneous Tissue) Exposed: Yes Exposed: No Exposed: No Exposed: No xposed: No tion in Area: -3.2% tion in Volume: -106.4% ialization: Small (1-33%) ing: No ining: No Electronic Signature(s) Signed: 04/26/2019 5:12:28 PM By: Mikeal Hawthorne EMT/HBOT Signed: 04/26/2019 5:17:01 PM By: Kela Millin Entered By: Mikeal Hawthorne on 04/26/2019 15:59:49 -------------------------------------------------------------------------------- Wound Assessment Details Patient Name: Date of Service: [name]. 04/26/2019 10:30 AM Medical Record [id number] Patient Account Number: [id number] Date of Birth/Sex: Treating RN: 05/26/1934 (85 y.o. [name] Primary Care Mccall Lomax: [name] Other Clinician: Referring Gabbie Marzo: Treating Dilon Lank/Extender:[name], [name], [name] in Treatment: 1 Wound Status Wound Number: 2 Primary Etiology: Venous Leg Ulcer Wound Location: Right Lower Leg - Medial Wound  Status: Open Wounding Event: Trauma Comorbid History: Cataracts, Confinement Anxiety Date Acquired: 02/17/2019 Weeks Of Treatment: 1 Clustered Wound: No Photos Wound Measurements Length: (cm) 0.7 % Reduct Width: (cm) 0.5 % Reduct Depth: (cm) 0.1 Epitheli Area: (cm) 0.275 Tunneli Volume: (cm) 0.027 Undermi Wound Description Classification: Full Thickness Without Exposed Support Foul Odo Structures Slough/F Wound Flat and Intact Margin: Exudate Small Amount: Exudate Serosanguineous Type: Exudate red, brown Color: Wound Bed Granulation Amount: Large (67-100%) Granulation Quality: Red Fascia E Necrotic Amount: Small (1-33%) Fat Laye Necrotic Quality: Adherent Slough Tendon E Muscle E Joint Ex Bone Exp r After Cleansing: No ibrino Yes Exposed Structure xposed: No r (Subcutaneous Tissue) Exposed: Yes xposed: No xposed: No posed: No osed: No ion in Area: -16.5% ion in Volume: -12.5% alization: Small (1-33%) ng: No ning: No Electronic Signature(s) Signed: 04/26/2019 5:12:28 PM By: Ronnald Ramp,  Dedrick EMT/HBOT Signed: 04/26/2019 5:17:01 PM By: Kela Millin Entered By: Mikeal Hawthorne on 04/26/2019 16:00:10 -------------------------------------------------------------------------------- Vitals Details Patient Name: Date of Service: [name]. 04/26/2019 10:30 AM Medical Record [id number] Patient Account Number: [id number] Date of Birth/Sex: Treating RN: 01-21-1935 (85 y.o. [name], [name] Primary Care Lakendrick Paradis: [name] Other Clinician: Referring Juanita Devincent: Treating Sruti Ayllon/Extender:[name], [name], [name] in Treatment: 1 Vital Signs Time Taken: 11:07 Temperature (F): 97.8 Height (in): 64 Pulse (bpm): 93 Weight (lbs): 125 Respiratory Rate (breaths/min): 20 Body Mass Index (BMI): 21.5 Blood Pressure (mmHg): 154/89 Reference Range: 80 - 120 mg / dl Electronic Signature(s) Signed: 04/26/2019 4:47:37 PM By: Deon Pilling Entered By: Deon Pilling on 04/26/2019 11:13:55

## 2019-04-30 DIAGNOSIS — Z9981 Dependence on supplemental oxygen: Secondary | ICD-10-CM | POA: Insufficient documentation

## 2019-05-02 ENCOUNTER — Encounter (HOSPITAL_BASED_OUTPATIENT_CLINIC_OR_DEPARTMENT_OTHER): Payer: Medicare HMO | Attending: Internal Medicine | Admitting: Internal Medicine

## 2019-05-02 ENCOUNTER — Other Ambulatory Visit: Payer: Self-pay

## 2019-05-02 DIAGNOSIS — L97212 Non-pressure chronic ulcer of right calf with fat layer exposed: Secondary | ICD-10-CM | POA: Diagnosis not present

## 2019-05-02 DIAGNOSIS — I10 Essential (primary) hypertension: Secondary | ICD-10-CM | POA: Insufficient documentation

## 2019-05-02 DIAGNOSIS — Z85828 Personal history of other malignant neoplasm of skin: Secondary | ICD-10-CM | POA: Diagnosis not present

## 2019-05-02 DIAGNOSIS — I872 Venous insufficiency (chronic) (peripheral): Secondary | ICD-10-CM | POA: Diagnosis not present

## 2019-05-02 DIAGNOSIS — W2203XA Walked into furniture, initial encounter: Secondary | ICD-10-CM | POA: Insufficient documentation

## 2019-05-02 DIAGNOSIS — Z7952 Long term (current) use of systemic steroids: Secondary | ICD-10-CM | POA: Insufficient documentation

## 2019-05-02 DIAGNOSIS — E785 Hyperlipidemia, unspecified: Secondary | ICD-10-CM | POA: Diagnosis not present

## 2019-05-02 DIAGNOSIS — J849 Interstitial pulmonary disease, unspecified: Secondary | ICD-10-CM | POA: Insufficient documentation

## 2019-05-02 DIAGNOSIS — L97812 Non-pressure chronic ulcer of other part of right lower leg with fat layer exposed: Secondary | ICD-10-CM | POA: Insufficient documentation

## 2019-05-02 DIAGNOSIS — I87311 Chronic venous hypertension (idiopathic) with ulcer of right lower extremity: Secondary | ICD-10-CM | POA: Insufficient documentation

## 2019-05-02 NOTE — Progress Notes (Addendum)
[name], [name] ([id number]) Visit Report for 05/02/2019 HPI Details Patient Name: Date of Service: [name], [name] 05/02/2019 10:30 AM Medical Record [id number] Patient Account Number: [id number] Date of Birth/Sex: Treating RN: [birth year] (85 y.o. F) Primary Care Provider: [name] Other Clinician: Referring Provider: Treating Provider/Extender:Sayed Apostol, Vista Lawman, Moses Manners in Treatment: 1 History of Present Illness HPI Description: ADMISSION 04/19/2019 This is an 85 year old woman who has chronic interstitial lung disease on prednisone 10 mg a day. Towards the end of December she hit her lateral right leg on a chair. She was seen in urgent care and the area was Steri-Stripped however this did not hold together. Sometime after this traumatized the medial part of her right leg. She has some chronic edema in her lower legs probably is a secondary phenomenon to chronic venous insufficiency. She also takes chronic steroids. He saw her primary physician who is Dr. [name] at [facility] and was given doxycycline on 2/8 I believe she has completed that. She is here for review of nonhealing areas on the right lateral greater than right medial calf Past medical history; includes interstitial lung disease on prednisone 10 mg and is just started Ofev. She has a history of basal cell skin cancer, hyperlipidemia. ABI in our clinic was 1.04 in the right 2/26; patient with chronic venous insufficiency and interstitial lung disease on chronic prednisone. She has a wound on her right lateral and right medial lower leg on the right which were separate incidents of trauma. These wounds started in December. She had very significant debris on both of the wounds last week which required debridement they require debridement again today. We have been using Iodoflex 3/4; the patient has two wounds on her right lower leg one laterally and one medially in the setting of chronic venous hypertension  and resultant separate incidences of trauma. The wounds actually look better in terms of surface. We have been using Iodoflex, changed to [facility] classic today Electronic Signature(s) Signed: 05/02/2019 5:41:03 PM By: Linton Ham MD Entered By: Linton Ham on 05/02/2019 11:07:26 -------------------------------------------------------------------------------- Physical Exam Details Patient Name: Date of Service: Mammie Conway. 05/02/2019 10:30 AM Medical Record [id number] Patient Account Number: [id number] Date of Birth/Sex: Treating RN: [birth year] (85 y.o. F) Primary Care Provider: [name] Other Clinician: Referring Provider: Treating Provider/Extender:Joshua Zeringue, Vista Lawman, Moses Manners in Treatment: 1 Constitutional Sitting or standing Blood Pressure is within target range for patient.. Pulse regular and within target range for patient.[name] Respirations regular, non-labored and within target range.. Temperature is normal and within the target range for the patient.[name] Appears in no distress. Respiratory work of breathing is normal. Cardiovascular Needle pulses are palpable. Minimal edema on the right leg. She has severe chronic venous hypertension with hemosiderin deposition and skin damage. Psychiatric appears at normal baseline. Notes Wound exam Major improvement in the surface of both wounds no mechanical debridement is necessary. Granulation looks healthy both wound surfaces were washed off with Anasept and gauze but they look healthy. Some debris debris around the circumference but I didn't think this required mechanical debridement Electronic Signature(s) Signed: 05/02/2019 5:41:03 PM By: Linton Ham MD Entered By: Linton Ham on 05/02/2019 11:09:51 -------------------------------------------------------------------------------- Physician Orders Details Patient Name: Date of Service: Mammie Conway. 05/02/2019 10:30 AM Medical Record  [id number] Patient Account Number: [id number] Date of Birth/Sex: Treating RN: 10-07-1934 (85 y.o. Debby Bud Primary Care Provider: [name] Other Clinician: Referring Provider: Treating Provider/Extender:Dahl Higinbotham, Vista Lawman, Moses Manners in Treatment: 1 Verbal / Phone Orders: No Diagnosis  Coding ICD-10 Coding Code Description S81.811D Laceration without foreign body, right lower leg, subsequent encounter L97.812 Non-pressure chronic ulcer of other part of right lower leg with fat layer exposed I87.311 Chronic venous hypertension (idiopathic) with ulcer of right lower extremity Z92.241 Personal history of systemic steroid therapy Follow-up Appointments Return Appointment in 1 week. Dressing Change Frequency Wound #1 Right,Lateral Lower Leg Do not change entire dressing for one week. Skin Barriers/Peri-Wound Care Moisturizing lotion Wound Cleansing May shower with protection. - use a cast protector. can get at [facility] or [facility] Primary Wound Dressing Wound #1 Right,Lateral Lower Leg Skin Substitute Application - run insurance for approval for apligraft. Hydrofera Blue - classic Wound #2 Right,Medial Lower Leg Hydrofera Blue - classic Secondary Dressing Dry Gauze ABD pad Edema Control 3 Layer Compression System - Right Lower Extremity Avoid standing for long periods of time Elevate legs to the level of the heart or above for 30 minutes daily and/or when sitting, a frequency of: Exercise regularly Electronic Signature(s) Signed: 05/02/2019 5:41:03 PM By: Linton Ham MD Signed: 05/02/2019 6:05:23 PM By: Deon Pilling Entered By: Deon Pilling on 05/02/2019 10:53:18 -------------------------------------------------------------------------------- Problem List Details Patient Name: Date of Service: Mammie Conway. 05/02/2019 10:30 AM Medical Record [id number] Patient Account Number: [id number] Date of Birth/Sex: Treating RN: [birth year] (85 y.o. [name], [name] Primary Care Provider: [name] Other Clinician: Referring Provider: Treating Provider/Extender:Azzan Butler, Vista Lawman, Moses Manners in Treatment: 1 Active Problems ICD-10 Evaluated Encounter Code Description Active Date Today Diagnosis I87.311 Chronic venous hypertension (idiopathic) with ulcer of 04/19/2019 No Yes right lower extremity L97.812 Non-pressure chronic ulcer of other part of right lower 04/19/2019 No Yes leg with fat layer exposed S81.811D Laceration without foreign body, right lower leg, 04/19/2019 No Yes subsequent encounter Z92.241 Personal history of systemic steroid therapy 04/19/2019 No Yes Inactive Problems Resolved Problems Electronic Signature(s) Signed: 05/03/2019 5:21:33 PM By: Linton Ham MD Signed: 05/06/2019 5:59:01 PM By: Levan Hurst RN, BSN Previous Signature: 05/02/2019 5:41:03 PM Version By: Linton Ham MD Entered By: Levan Hurst on 05/03/2019 10:29:55 -------------------------------------------------------------------------------- Progress Note Details Patient Name: Date of Service: Mammie Conway. 05/02/2019 10:30 AM Medical Record [id number] Patient Account Number: [id number] Date of Birth/Sex: Treating RN: 29-Jul-1934 (85 y.o. F) Primary Care Provider: [name] Other Clinician: Referring Provider: Treating Provider/Extender:Mayrani Khamis, Vista Lawman, Moses Manners in Treatment: 1 Subjective History of Present Illness (HPI) ADMISSION 04/19/2019 This is an 85 year old woman who has chronic interstitial lung disease on prednisone 10 mg a day. Towards the end of December she hit her lateral right leg on a chair. She was seen in urgent care and the area was Steri-Stripped however this did not hold together. Sometime after this traumatized the medial part of her right leg. She has some chronic edema in her lower legs probably is a secondary phenomenon to chronic venous insufficiency. She also takes chronic steroids. He  saw her primary physician who is Dr. [name] at [place] and was given doxycycline on 2/8 I believe she has completed that. She is here for review of nonhealing areas on the right lateral greater than right medial calf Past medical history; includes interstitial lung disease on prednisone 10 mg and is just started Ofev. She has a history of basal cell skin cancer, hyperlipidemia. ABI in our clinic was 1.04 in the right 2/26; patient with chronic venous insufficiency and interstitial lung disease on chronic prednisone. She has a wound on her right lateral and right medial lower leg on the right which were separate incidents  of trauma. These wounds started in December. She had very significant debris on both of the wounds last week which required debridement they require debridement again today. We have been using Iodoflex 3/4; the patient has two wounds on her right lower leg one laterally and one medially in the setting of chronic venous hypertension and resultant separate incidences of trauma. The wounds actually look better in terms of surface. We have been using Iodoflex, changed to Hydrofera Blue classic today Objective Constitutional Sitting or standing Blood Pressure is within target range for patient.. Pulse regular and within target range for patient.[name] Respirations regular, non-labored and within target range.. Temperature is normal and within the target range for the patient.[name] Appears in no distress. Vitals Time Taken: 10:37 AM, Height: 64 in, Weight: 125 lbs, BMI: 21.5, Temperature: 97.8 F, Pulse: 85 bpm, Respiratory Rate: 20 breaths/min, Blood Pressure: 132/76 mmHg. Respiratory work of breathing is normal. Cardiovascular Needle pulses are palpable. Minimal edema on the right leg. She has severe chronic venous hypertension with hemosiderin deposition and skin damage. Psychiatric appears at normal baseline. General Notes: Wound exam ooMajor improvement in the surface of both  wounds no mechanical debridement is necessary. Granulation looks healthy both wound surfaces were washed off with Anasept and gauze but they look healthy. Some debris debris around the circumference but I didn't think this required mechanical debridement Integumentary (Hair, Skin) Wound #1 status is Open. Original cause of wound was Trauma. The wound is located on the Right,Lateral Lower Leg. The wound measures 1.9cm length x 2.8cm width x 0.2cm depth; 4.178cm^2 area and 0.836cm^3 volume. There is Fat Layer (Subcutaneous Tissue) Exposed exposed. There is no tunneling or undermining noted. There is a medium amount of serosanguineous drainage noted. The wound margin is flat and intact. There is large (67-100%) red granulation within the wound bed. There is a small (1-33%) amount of necrotic tissue within the wound bed including Adherent Slough. Wound #2 status is Open. Original cause of wound was Trauma. The wound is located on the Right,Medial Lower Leg. The wound measures 0.7cm length x 0.5cm width x 0.1cm depth; 0.275cm^2 area and 0.027cm^3 volume. There is Fat Layer (Subcutaneous Tissue) Exposed exposed. There is no tunneling or undermining noted. There is a small amount of serosanguineous drainage noted. The wound margin is flat and intact. There is large (67-100%) red granulation within the wound bed. There is a small (1-33%) amount of necrotic tissue within the wound bed including Adherent Slough. Assessment Active Problems ICD-10 Non-pressure chronic ulcer of other part of right lower leg with fat layer exposed Chronic venous hypertension (idiopathic) with ulcer of right lower extremity Laceration without foreign body, right lower leg, subsequent encounter Personal history of systemic steroid therapy Procedures Wound #1 Pre-procedure diagnosis of Wound #1 is a Venous Leg Ulcer located on the Right,Lateral Lower Leg . There was a Three Layer Compression Therapy Procedure with a  pre-treatment ABI of 1 by Levan Hurst, RN. Post procedure Diagnosis Wound #1: Same as Pre-Procedure Wound #2 Pre-procedure diagnosis of Wound #2 is a Venous Leg Ulcer located on the Right,Medial Lower Leg . There was a Three Layer Compression Therapy Procedure with a pre-treatment ABI of 1 by Levan Hurst, RN. Post procedure Diagnosis Wound #2: Same as Pre-Procedure Plan Follow-up Appointments: Return Appointment in 1 week. Dressing Change Frequency: Wound #1 Right,Lateral Lower Leg: Do not change entire dressing for one week. Skin Barriers/Peri-Wound Care: Moisturizing lotion Wound Cleansing: May shower with protection. - use a cast protector. can get  at [facility] Primary Wound Dressing: Wound #1 Right,Lateral Lower Leg: Skin Substitute Application - run insurance for approval for apligraft. Hydrofera Blue - classic Wound #2 Right,Medial Lower Leg: Hydrofera Blue - classic Secondary Dressing: Dry Gauze ABD pad Edema Control: 3 Layer Compression System - Right Lower Extremity Avoid standing for long periods of time Elevate legs to the level of the heart or above for 30 minutes daily and/or when sitting, a frequency of: Exercise regularly 1. Change the dressing to [facility] Blue classic. Wound bed surface looks good changed from Iodoflex 2. The area on the lateral part of the calf has probably 3 mm in depth I'm therefore going to run Apligraf through her insurance although I'm hopeful not to use this. 3. Three layer compression and ABDs seem to be effective in controlling her edema 4. The patient is on chronic prednisone hopefully will be able to get this area to close. Skin damage no doubt contributing to the pathogenesis of this wound and difficulty healing Electronic Signature(s) Signed: 05/02/2019 5:41:03 PM By: Linton Ham MD Previous Signature: 05/02/2019 11:12:10 AM Version By: Linton Ham MD Entered By: Linton Ham on 05/02/2019  11:13:50 -------------------------------------------------------------------------------- SuperBill Details Patient Name: Date of Service: Mammie Conway 05/02/2019 Medical Record [id number] Patient Account Number: [id number] Date of Birth/Sex: Treating RN: [birth year] (85 y.o. [name], [name] Primary Care Provider: [name] Other Clinician: Referring Provider: Treating Provider/Extender:Chevez Sambrano, Vista Lawman, Moses Manners in Treatment: 1 Diagnosis Coding ICD-10 Codes Code Description [phone] Laceration without foreign body, right lower leg, subsequent encounter U41.146 Non-pressure chronic ulcer of other part of right lower leg with fat layer exposed I87.311 Chronic venous hypertension (idiopathic) with ulcer of right lower extremity Z92.241 Personal history of systemic steroid therapy Facility Procedures CPT4 Code Description: [id number] (Facility Use Only) [phone] - [facility] LWR RT LEG Modifier: Quantity: 1 Physician Procedures CPT4 Code Description: [id number] - WC PHYS LEVEL 3 - EST PT ICD-10 Diagnosis Description Q58.346 Non-pressure chronic ulcer of other part of right lower leg I87.311 Chronic venous hypertension (idiopathic) with ulcer of righ Modifier: with fat laye t lower extrem Quantity: 1 r exposed ity Electronic Signature(s) Signed: 05/02/2019 5:41:03 PM By: Linton Ham MD Entered By: Linton Ham on 05/02/2019 11:11:18

## 2019-05-02 NOTE — Progress Notes (Signed)
OTHA, RICKLES ([id number]) Visit Report for 05/02/2019 Arrival Information Details Patient Name: Date of Service: JEANIFER, HALLIDAY 05/02/2019 10:30 AM Medical Record [id number] Patient Account Number: [id number] Date of Birth/Sex: Treating RN: Mar 26, 1934 (85 y.o. [name], [name] Primary Care Devonne Lalani: [name] Other Clinician: Referring Devean Skoczylas: Treating Vernona Peake/Extender:Robson, Vista Lawman, Moses Manners in Treatment: 1 Visit Information History Since Last Visit Added or deleted any medications: No Patient Arrived: Ambulatory Any new allergies or adverse reactions: No Arrival Time: 10:35 Had a fall or experienced change in No Accompanied By: husband activities of daily living that may affect Transfer Assistance: None risk of falls: Patient Identification Verified: Yes Signs or symptoms of abuse/neglect since last No Secondary Verification Process Completed: Yes visito Patient Requires Transmission-Based No Hospitalized since last visit: No Precautions: Implantable device outside of the clinic excluding No Patient Has Alerts: No cellular tissue based products placed in the center since last visit: Has Dressing in Place as Prescribed: Yes Has Compression in Place as Prescribed: Yes Pain Present Now: No Electronic Signature(s) Signed: 05/02/2019 6:05:23 PM By: Deon Pilling Entered By: Deon Pilling on 05/02/2019 10:42:43 -------------------------------------------------------------------------------- Compression Therapy Details Patient Name: Date of Service: ESMEE, FALLAW 05/02/2019 10:30 AM Medical Record [id number] Patient Account Number: [id number] Date of Birth/Sex: Treating RN: 1935-01-28 (85 y.o. Debby Bud Primary Care Olimpia Tinch: [name] Other Clinician: Referring Makinzi Prieur: Treating Caylei Sperry/Extender:Robson, Vista Lawman, Moses Manners in Treatment: 1 Compression Therapy Performed for Wound Wound #1 Right,Lateral Lower  Leg Assessment: Performed By: Clinician Levan Hurst, RN Compression Type: Three Layer Pre Treatment ABI: 1 Post Procedure Diagnosis Same as Pre-procedure Electronic Signature(s) Signed: 05/02/2019 6:05:23 PM By: Deon Pilling Entered By: Deon Pilling on 05/02/2019 10:52:09 -------------------------------------------------------------------------------- Compression Therapy Details Patient Name: Date of Service: GERALENE, AFSHAR 05/02/2019 10:30 AM Medical Record [id number] Patient Account Number: [id number] Date of Birth/Sex: Treating RN: 1934-06-18 (85 y.o. Debby Bud Primary Care Audrielle Vankuren: [name] Other Clinician: Referring Maalik Pinn: Treating Samuell Knoble/Extender:Robson, Vista Lawman, Moses Manners in Treatment: 1 Compression Therapy Performed for Wound Wound #2 Right,Medial Lower Leg Assessment: Performed By: Clinician Levan Hurst, RN Compression Type: Three Layer Pre Treatment ABI: 1 Post Procedure Diagnosis Same as Pre-procedure Electronic Signature(s) Signed: 05/02/2019 6:05:23 PM By: Deon Pilling Entered By: Deon Pilling on 05/02/2019 10:52:10 -------------------------------------------------------------------------------- Encounter Discharge Information Details Patient Name: Date of Service: [name] 05/02/2019 10:30 AM Medical Record [id number] Patient Account Number: [id number] Date of Birth/Sex: Treating RN: 1934-03-10 (85 y.o. [name] Primary Care Blockton Ducre: [name] Other Clinician: Referring Lilburn Straw: Treating Alexcis Bicking/Extender:Robson, Vista Lawman, Moses Manners in Treatment: 1 Encounter Discharge Information Items Discharge Condition: Stable Ambulatory Status: Ambulatory Discharge Destination: Home Transportation: Private Auto Accompanied By: alone Schedule Follow-up Appointment: Yes Clinical Summary of Care: Patient Declined Electronic Signature(s) Signed: 05/02/2019 5:39:26 PM By: Levan Hurst RN,  BSN Entered By: Levan Hurst on 05/02/2019 11:13:48 -------------------------------------------------------------------------------- Lower Extremity Assessment Details Patient Name: Date of Service: [name] 05/02/2019 10:30 AM Medical Record [id number] Patient Account Number: [id number] Date of Birth/Sex: Treating RN: Apr 12, 1934 (85 y.o. [name], [name] Primary Care [place] Kuang: [name] Other Clinician: Referring Dariyon Urquilla: Treating Kennedy Bohanon/Extender:Robson, Vista Lawman, Moses Manners in Treatment: 1 Edema Assessment Assessed: [Left: No] [Right: Yes] Edema: [Left: Ye] [Right: s] Calf Left: Right: Point of Measurement: cm From Medial Instep cm 32 cm Ankle Left: Right: Point of Measurement: cm From Medial Instep cm 23 cm Vascular Assessment Pulses: Dorsalis Pedis Palpable: [Right:Yes] Electronic Signature(s) Signed: 05/02/2019 6:05:23 PM By: Deon Pilling Entered By: Deon Pilling on  05/02/2019 10:45:43 -------------------------------------------------------------------------------- Multi Wound Chart Details Patient Name: Date of Service: TERRIONA, HORLACHER 05/02/2019 10:30 AM Medical Record [id number] Patient Account Number: [id number] Date of Birth/Sex: Treating RN: Aug 11, 1934 (85 y.o. F) Primary Care Alyce Inscore: [name] Other Clinician: Referring Lynee Rosenbach: Treating Brooksie Ellwanger/Extender:Robson, Vista Lawman, Moses Manners in Treatment: 1 Vital Signs Height(in): 64 Pulse(bpm): 85 Weight(lbs): 125 Blood Pressure(mmHg): 132/76 Body Mass Index(BMI): 21 Temperature(F): 97.8 Respiratory 20 Rate(breaths/min): Photos: [1:No Photos] [2:No Photos] [N/A:N/A] Wound Location: [1:Right Lower Leg - Lateral Right Lower Leg - Medial N/A] Wounding Event: [1:Trauma] [2:Trauma] [N/A:N/A] Primary Etiology: [1:Venous Leg Ulcer] [2:Venous Leg Ulcer] [N/A:N/A] Comorbid History: [1:Cataracts, Confinement Anxiety] [2:Cataracts, Confinement Anxiety] [N/A:N/A] Date  Acquired: [1:02/27/2019] [2:02/17/2019] [N/A:N/A] Weeks of Treatment: [1:1] [2:1] [N/A:N/A] Wound Status: [1:Open] [2:Open] [N/A:N/A] Measurements L x W x D 1.9x2.8x0.2 [2:0.7x0.5x0.1] [N/A:N/A] (cm) Area (cm) : [1:4.178] [2:0.275] [N/A:N/A] Volume (cm) : [1:0.836] [2:0.027] [N/A:N/A] % Reduction in Area: [1:-40.70%] [2:-16.50%] [N/A:N/A] % Reduction in Volume: -181.50% [2:-12.50%] [N/A:N/A] Classification: [1:Full Thickness Without Exposed Support Structures Exposed Support Structures] [2:Full Thickness Without] [N/A:N/A] Exudate Amount: [1:Medium] [2:Small] [N/A:N/A] Exudate Type: [1:Serosanguineous] [2:Serosanguineous] [N/A:N/A] Exudate Color: [1:red, brown] [2:red, brown] [N/A:N/A] Wound Margin: [1:Flat and Intact] [2:Flat and Intact] [N/A:N/A] Granulation Amount: [1:Large (67-100%)] [2:Large (67-100%)] [N/A:N/A] Granulation Quality: [1:Red] [2:Red] [N/A:N/A] Necrotic Amount: [1:Small (1-33%)] [2:Small (1-33%)] [N/A:N/A] Exposed Structures: [1:Fat Layer (Subcutaneous Fat Layer (Subcutaneous N/A Tissue) Exposed: Yes Fascia: No Tendon: No Muscle: No Joint: No Bone: No] [2:Tissue) Exposed: Yes Fascia: No Tendon: No Muscle: No Joint: No Bone: No] Epithelialization: [1:Small (1-33%)] [2:Small (1-33%) Compression Therapy] [N/A:N/A N/A] Treatment Notes Electronic Signature(s) Signed: 05/02/2019 5:41:03 PM By: Linton Ham MD Entered By: Linton Ham on 05/02/2019 11:06:20 -------------------------------------------------------------------------------- Multi-Disciplinary Care Plan Details Patient Name: Date of Service: [name] 05/02/2019 10:30 AM Medical Record [id number] Patient Account Number: [id number] Date of Birth/Sex: Treating RN: 1934/12/31 (85 y.o. [name], [name] Primary Care Imara Standiford: [name] Other Clinician: Referring Delmore Sear: Treating Leaha Cuervo/Extender:Robson, Vista Lawman, Moses Manners in Treatment: 1 Active Inactive Pain, Acute or  Chronic Nursing Diagnoses: Pain, acute or chronic: actual or potential Goals: Patient/caregiver will verbalize adequate pain control between visits Date Initiated: 04/19/2019 Target Resolution Date: 05/31/2019 Goal Status: Active Interventions: Provide education on pain management Notes: Wound/Skin Impairment Nursing Diagnoses: Impaired tissue integrity Goals: Ulcer/skin breakdown will have a volume reduction of 30% by week 4 Date Initiated: 04/19/2019 Target Resolution Date: 05/24/2019 Goal Status: Active Interventions: Provide education on ulcer and skin care Notes: Electronic Signature(s) Signed: 05/02/2019 6:05:23 PM By: Deon Pilling Entered By: Deon Pilling on 05/02/2019 10:33:53 -------------------------------------------------------------------------------- Pain Assessment Details Patient Name: Date of Service: DANYELL, SHADER 05/02/2019 10:30 AM Medical Record [id number] Patient Account Number: [id number] Date of Birth/Sex: Treating RN: 11-18-1934 (85 y.o. Debby Bud Primary Care Irena Gaydos: [name] Other Clinician: Referring Saniah Schroeter: Treating Caylor Tallarico/Extender:Robson, Vista Lawman, Moses Manners in Treatment: 1 Active Problems Location of Pain Severity and Description of Pain Patient Has Paino No Site Locations Rate the pain. Current Pain Level: 0 Pain Management and Medication Current Pain Management: Medication: No Cold Application: No Rest: No Massage: No Activity: No T.E.N.S.: No Heat Application: No Leg drop or elevation: No Is the Current Pain Management Adequate: Adequate How does your wound impact your activities of daily livingo Sleep: No Bathing: No Appetite: No Relationship With Others: No Bladder Continence: No Emotions: No Bowel Continence: No Work: No Toileting: No Drive: No Dressing: No Hobbies: No Electronic Signature(s) Signed: 05/02/2019 6:05:23 PM By: Deon Pilling Entered By: Deon Pilling on 05/02/2019  10:43:40 -------------------------------------------------------------------------------- Patient/Caregiver Education Details Patient Name: Date of Service: SHARNETTA, GIELOW 3/4/2021andnbsp10:30 AM Medical Record [id number] Patient Account Number: [id number] Date of Birth/Gender: Treating RN: [birth year] (85 y.o. Debby Bud Primary Care Physician: [name] Other Clinician: Referring Physician: Treating Physician/Extender:Robson, Vista Lawman, Moses Manners in Treatment: 1 Education Assessment Education Provided To: Patient Education Topics Provided Wound/Skin Impairment: Handouts: Skin Care Do's and Dont's Methods: Explain/Verbal Responses: Reinforcements needed Electronic Signature(s) Signed: 05/02/2019 6:05:23 PM By: Deon Pilling Entered By: Deon Pilling on 05/02/2019 10:34:03 -------------------------------------------------------------------------------- Wound Assessment Details Patient Name: Date of Service: [name], [name] 05/02/2019 10:30 AM Medical Record [id number] Patient Account Number: [id number] Date of Birth/Sex: Treating RN: [birth year] (85 y.o. F) Primary Care Amari Zagal: [name] Other Clinician: Referring Hardeep Reetz: Treating Shadae Reino/Extender:Robson, Vista Lawman, Moses Manners in Treatment: 1 Wound Status Wound Number: 1 Primary Etiology: Venous Leg Ulcer Wound Location: Right Lower Leg - Lateral Wound Status: Open Wounding Event: Trauma Comorbid History: Cataracts, Confinement Anxiety Date Acquired: 02/27/2019 Weeks Of Treatment: 1 Clustered Wound: No Photos Wound Measurements Length: (cm) 1.9 % Reductio Width: (cm) 2.8 % Reductio Depth: (cm) 0.2 Epithelial Area: (cm) 4.178 Tunneling Volume: (cm) 0.836 Underm Wound Description Classification: Full Thickness Without Exposed Support Foul Od Structures Slough/ Wound Flat and Intact Margin: Exudate Medium Amount: Exudate Serosanguineous Type: Exudate red,  brown Color: Wound Bed Granulation Amount: Large (67-100%) Granulation Quality: Red Fascia Necrotic Amount: Small (1-33%) Fat Lay Necrotic Quality: Adherent Slough Tendon Muscle Joint E Bone Ex or After Cleansing: No Fibrino Yes Exposed Structure Exposed: No er (Subcutaneous Tissue) Exposed: Yes Exposed: No Exposed: No xposed: No posed: No n in Area: -40.7% n in Volume: -181.5% ization: Small (1-33%) : No ining: No Treatment Notes Wound #1 (Right, Lateral Lower Leg) 1. Cleanse With Soap and water 2. Periwound Care Moisturizing lotion 3. Primary Dressing Applied Hydrofera Blue 4. Secondary Dressing ABD Pad Dry Gauze 6. Support Layer Applied 3 layer compression wrap Electronic Signature(s) Signed: 05/02/2019 3:59:23 PM By: Mikeal Hawthorne EMT/HBOT Entered By: Mikeal Hawthorne on 05/02/2019 15:57:37 -------------------------------------------------------------------------------- Wound Assessment Details Patient Name: Date of Service: KESSLER, KOPINSKI 05/02/2019 10:30 AM Medical Record [id number] Patient Account Number: [id number] Date of Birth/Sex: Treating RN: 02-18-1935 (85 y.o. F) Primary Care Shalandra Leu: [name] Other Clinician: Referring Clarice Zulauf: Treating Moriah Loughry/Extender:Robson, Vista Lawman, Moses Manners in Treatment: 1 Wound Status Wound Number: 2 Primary Etiology: Venous Leg Ulcer Wound Location: Right Lower Leg - Medial Wound Status: Open Wounding Event: Trauma Comorbid History: Cataracts, Confinement Anxiety Date Acquired: 02/17/2019 Weeks Of Treatment: 1 Clustered Wound: No Photos Wound Measurements Length: (cm) 0.7 % Reduct Width: (cm) 0.5 % Reduct Depth: (cm) 0.1 Epitheli Area: (cm) 0.275 Tunneli Volume: (cm) 0.027 Undermi Wound Description Classification: Full Thickness Without Exposed Support Foul Odo Structures Slough/F Wound Flat and Intact Margin: Exudate Small Amount: Exudate Serosanguineous Type: Exudate red,  brown Color: Wound Bed Granulation Amount: Large (67-100%) Granulation Quality: Red Fascia E Necrotic Amount: Small (1-33%) Fat Laye Necrotic Quality: Adherent Slough Tendon E Muscle E Joint Ex Bone Exp r After Cleansing: No ibrino Yes Exposed Structure xposed: No r (Subcutaneous Tissue) Exposed: Yes xposed: No xposed: No posed: No osed: No ion in Area: -16.5% ion in Volume: -12.5% alization: Small (1-33%) ng: No ning: No Treatment Notes Wound #2 (Right, Medial Lower Leg) 1. Cleanse With Soap and water 2. Periwound Care Moisturizing lotion 3. Primary Dressing Applied Hydrofera Blue 4. Secondary Dressing ABD Pad Dry Gauze 6. Support Layer Applied 3 layer compression Water quality scientist) Signed:  05/02/2019 3:59:23 PM By: Mikeal Hawthorne EMT/HBOT Entered By: Mikeal Hawthorne on 05/02/2019 15:43:31 -------------------------------------------------------------------------------- Vitals Details Patient Name: Date of Service: [name] 05/02/2019 10:30 AM Medical Record [id number] Patient Account Number: [id number] Date of Birth/Sex: Treating RN: Dec 31, 1934 (85 y.o. [name], [name] Primary Care Delaila Nand: [name] Other Clinician: Referring Lakeyta Vandenheuvel: Treating Deandrew Hoecker/Extender:Robson, Vista Lawman, Moses Manners in Treatment: 1 Vital Signs Time Taken: 10:37 Temperature (F): 97.8 Height (in): 64 Pulse (bpm): 85 Weight (lbs): 125 Respiratory Rate (breaths/min): 20 Body Mass Index (BMI): 21.5 Blood Pressure (mmHg): 132/76 Reference Range: 80 - 120 mg / dl Electronic Signature(s) Signed: 05/02/2019 6:05:23 PM By: Deon Pilling Entered By: Deon Pilling on 05/02/2019 10:43:25

## 2019-05-09 ENCOUNTER — Encounter (HOSPITAL_BASED_OUTPATIENT_CLINIC_OR_DEPARTMENT_OTHER): Payer: Medicare HMO | Admitting: Internal Medicine

## 2019-05-09 ENCOUNTER — Other Ambulatory Visit: Payer: Self-pay

## 2019-05-09 DIAGNOSIS — W2203XA Walked into furniture, initial encounter: Secondary | ICD-10-CM | POA: Diagnosis not present

## 2019-05-09 DIAGNOSIS — J849 Interstitial pulmonary disease, unspecified: Secondary | ICD-10-CM | POA: Diagnosis not present

## 2019-05-09 DIAGNOSIS — I10 Essential (primary) hypertension: Secondary | ICD-10-CM | POA: Diagnosis not present

## 2019-05-09 DIAGNOSIS — I872 Venous insufficiency (chronic) (peripheral): Secondary | ICD-10-CM | POA: Diagnosis not present

## 2019-05-09 DIAGNOSIS — Z7952 Long term (current) use of systemic steroids: Secondary | ICD-10-CM | POA: Diagnosis not present

## 2019-05-09 DIAGNOSIS — I87311 Chronic venous hypertension (idiopathic) with ulcer of right lower extremity: Secondary | ICD-10-CM | POA: Diagnosis not present

## 2019-05-09 DIAGNOSIS — L97812 Non-pressure chronic ulcer of other part of right lower leg with fat layer exposed: Secondary | ICD-10-CM | POA: Diagnosis not present

## 2019-05-09 DIAGNOSIS — Z85828 Personal history of other malignant neoplasm of skin: Secondary | ICD-10-CM | POA: Diagnosis not present

## 2019-05-09 DIAGNOSIS — E785 Hyperlipidemia, unspecified: Secondary | ICD-10-CM | POA: Diagnosis not present

## 2019-05-11 NOTE — Progress Notes (Signed)
SABINA, BEAVERS ([id number]) Visit Report for 05/09/2019 Debridement Details Patient Name: Date of Service: BARBAR, BREDE 05/09/2019 10:15 AM Medical Record [id number] Patient Account Number: [id number] Date of Birth/Sex: Treating RN: 25-May-1934 (86 y.o. [name], [name] Primary Care Provider: [name] Other Clinician: Referring Provider: Treating Provider/Extender:Winston Misner, Vista Lawman, Moses Manners in Treatment: 2 Debridement Performed for Wound #2 Right,Medial Lower Leg Assessment: Performed By: Physician Ricard Dillon., MD Debridement Type: Debridement Severity of Tissue Pre Fat layer exposed Debridement: Level of Consciousness (Pre- Awake and Alert procedure): Pre-procedure Verification/Time Out Taken: Yes - 11:50 Start Time: 11:51 Pain Control: Lidocaine 4% Topical Solution Total Area Debrided (L x W): 1 (cm) x 0.5 (cm) = 0.5 (cm) Tissue and other material Non-Viable, Eschar debrided: Level: Non-Viable Tissue Debridement Description: Selective/Open Wound Instrument: Curette Bleeding: Minimum Hemostasis Achieved: Pressure End Time: 11:56 Procedural Pain: 0 Post Procedural Pain: 0 Response to Treatment: Procedure was tolerated well Level of Consciousness Awake and Alert (Post-procedure): Post Debridement Measurements of Total Wound Length: (cm) 1 Width: (cm) 0.5 Depth: (cm) 0.1 Volume: (cm) 0.039 Character of Wound/Ulcer Post Improved Debridement: Severity of Tissue Post Debridement: Fat layer exposed Post Procedure Diagnosis Same as Pre-procedure Electronic Signature(s) Signed: 05/09/2019 6:00:33 PM By: Deon Pilling Signed: 05/11/2019 7:03:15 AM By: Linton Ham MD Entered By: Linton Ham on 05/09/2019 12:40:12 -------------------------------------------------------------------------------- HPI Details Patient Name: Date of Service: [name] 05/09/2019 10:15 AM Medical Record [id number] Patient Account Number:  [id number] Date of Birth/Sex: Treating RN: [birth year] (85 y.o. Debby Bud Primary Care Provider: [name] Other Clinician: Referring Provider: Treating Provider/Extender:Yaminah Clayborn, Vista Lawman, Moses Manners in Treatment: 2 History of Present Illness HPI Description: ADMISSION 04/19/2019 This is an 85 year old woman who has chronic interstitial lung disease on prednisone 10 mg a day. Towards the end of December she hit her lateral right leg on a chair. She was seen in urgent care and the area was Steri-Stripped however this did not hold together. Sometime after this traumatized the medial part of her right leg. She has some chronic edema in her lower legs probably is a secondary phenomenon to chronic venous insufficiency. She also takes chronic steroids. He saw her primary physician who is Dr. [name] at [place] and was given doxycycline on 2/8 I believe she has completed that. She is here for review of nonhealing areas on the right lateral greater than right medial calf Past medical history; includes interstitial lung disease on prednisone 10 mg and is just started Ofev. She has a history of basal cell skin cancer, hyperlipidemia. ABI in our clinic was 1.04 in the right 2/26; patient with chronic venous insufficiency and interstitial lung disease on chronic prednisone. She has a wound on her right lateral and right medial lower leg on the right which were separate incidents of trauma. These wounds started in December. She had very significant debris on both of the wounds last week which required debridement they require debridement again today. We have been using Iodoflex 3/4; the patient has two wounds on her right lower leg one laterally and one medially in the setting of chronic venous hypertension and resultant separate incidences of trauma. The wounds actually look better in terms of surface. We have been using Iodoflex, changed to [facility] classic today 3/11; she  complained of pain almost like lancinating pain in the wound on the right lateral leg. This was new this week and she attributed this to the fact that we changed her to [facility]. Previously everything was  well tolerated including at least 2 weeks of Sorbact. She has a $662 per application co-pay for Apligraf. She expressed the fact that it they would be able to pay this if it was felt to be necessary Electronic Signature(s) Signed: 05/11/2019 7:03:15 AM By: Linton Ham MD Entered By: Linton Ham on 05/09/2019 12:41:14 -------------------------------------------------------------------------------- Physical Exam Details Patient Name: Date of Service: [name] 05/09/2019 10:15 AM Medical Record [id number] Patient Account Number: [id number] Date of Birth/Sex: Treating RN: Mar 19, 1934 (85 y.o. Debby Bud Primary Care Provider: [name] Other Clinician: Referring Provider: Treating Provider/Extender:Fritzi Scripter, Vista Lawman, Moses Manners in Treatment: 2 Constitutional Patient is hypertensive.. Pulse regular and within target range for patient.[name] Respirations regular, non-labored and within target range.. Temperature is normal and within the target range for the patient.[name] Appears in no distress. Cardiovascular Pedal pulses palpable. Notes Wound exam; the patient has quite an improvement in the lateral right calf wound. The medial area had a thick eschar on top which I removed with a #3 curette relatively unchanged from last week. The areas were washed off with Anasept and gauze but no further debridement was felt to be necessary. There is absolutely no evidence of infection around the wound to account for the patient's pain Electronic Signature(s) Signed: 05/11/2019 7:03:15 AM By: Linton Ham MD Entered By: Linton Ham on 05/09/2019 12:48:41 -------------------------------------------------------------------------------- Physician Orders  Details Patient Name: Date of Service: KIRSTON, LUTY 05/09/2019 10:15 AM Medical Record [id number] Patient Account Number: [id number] Date of Birth/Sex: Treating RN: 12-Feb-1935 (85 y.o. [name], [name] Primary Care Provider: [name] Other Clinician: Referring Provider: Treating Provider/Extender:Havoc Sanluis, Vista Lawman, Moses Manners in Treatment: 2 Verbal / Phone Orders: No Diagnosis Coding ICD-10 Coding Code Description I87.311 Chronic venous hypertension (idiopathic) with ulcer of right lower extremity L97.812 Non-pressure chronic ulcer of other part of right lower leg with fat layer exposed S81.811D Laceration without foreign body, right lower leg, subsequent encounter Z92.241 Personal history of systemic steroid therapy Follow-up Appointments Return Appointment in 1 week. Dressing Change Frequency Wound #1 Right,Lateral Lower Leg Do not change entire dressing for one week. Skin Barriers/Peri-Wound Care Moisturizing lotion Wound Cleansing May shower with protection. - use a cast protector. can get at [facility] or [facility] Primary Wound Dressing Wound #1 Right,Lateral Lower Leg Polymem Silver Wound #2 Right,Medial Lower Leg Polymem Silver Secondary Dressing ABD pad Edema Control 3 Layer Compression System - Right Lower Extremity Avoid standing for long periods of time Elevate legs to the level of the heart or above for 30 minutes daily and/or when sitting, a frequency of: Exercise regularly Electronic Signature(s) Signed: 05/09/2019 6:00:33 PM By: Deon Pilling Signed: 05/11/2019 7:03:15 AM By: Linton Ham MD Entered By: Deon Pilling on 05/09/2019 11:57:14 -------------------------------------------------------------------------------- Problem List Details Patient Name: Date of Service: [name] 05/09/2019 10:15 AM Medical Record [id number] Patient Account Number: [id number] Date of Birth/Sex: Treating RN: 1934-08-22 (85 y.o. [name],  [name] Primary Care Provider: [name] Other Clinician: Referring Provider: Treating Provider/Extender:Wakisha Alberts, Vista Lawman, Moses Manners in Treatment: 2 Active Problems ICD-10 Evaluated Encounter Code Description Active Date Today Diagnosis I87.311 Chronic venous hypertension (idiopathic) with ulcer of 04/19/2019 No Yes right lower extremity L97.812 Non-pressure chronic ulcer of other part of right lower 04/19/2019 No Yes leg with fat layer exposed S81.811D Laceration without foreign body, right lower leg, 04/19/2019 No Yes subsequent encounter Z92.241 Personal history of systemic steroid therapy 04/19/2019 No Yes Inactive Problems Resolved Problems Electronic Signature(s) Signed: 05/11/2019 7:03:15 AM By: Linton Ham MD Entered By:  Linton Ham on 05/09/2019 12:39:49 -------------------------------------------------------------------------------- Progress Note Details Patient Name: Date of Service: LORALEI, RADCLIFFE 05/09/2019 10:15 AM Medical Record [id number] Patient Account Number: [id number] Date of Birth/Sex: Treating RN: 1935/01/29 (85 y.o. Debby Bud Primary Care Provider: [name] Other Clinician: Referring Provider: Treating Provider/Extender:Estellar Cadena, Vista Lawman, Moses Manners in Treatment: 2 Subjective History of Present Illness (HPI) ADMISSION 04/19/2019 This is an 85 year old woman who has chronic interstitial lung disease on prednisone 10 mg a day. Towards the end of December she hit her lateral right leg on a chair. She was seen in urgent care and the area was Steri-Stripped however this did not hold together. Sometime after this traumatized the medial part of her right leg. She has some chronic edema in her lower legs probably is a secondary phenomenon to chronic venous insufficiency. She also takes chronic steroids. He saw her primary physician who is Dr. [name] at [place] and was given doxycycline on 2/8 I believe she has  completed that. She is here for review of nonhealing areas on the right lateral greater than right medial calf Past medical history; includes interstitial lung disease on prednisone 10 mg and is just started Ofev. She has a history of basal cell skin cancer, hyperlipidemia. ABI in our clinic was 1.04 in the right 2/26; patient with chronic venous insufficiency and interstitial lung disease on chronic prednisone. She has a wound on her right lateral and right medial lower leg on the right which were separate incidents of trauma. These wounds started in December. She had very significant debris on both of the wounds last week which required debridement they require debridement again today. We have been using Iodoflex 3/4; the patient has two wounds on her right lower leg one laterally and one medially in the setting of chronic venous hypertension and resultant separate incidences of trauma. The wounds actually look better in terms of surface. We have been using Iodoflex, changed to [facility] classic today 3/11; she complained of pain almost like lancinating pain in the wound on the right lateral leg. This was new this week and she attributed this to the fact that we changed her to [facility]. Previously everything was well tolerated including at least 2 weeks of Sorbact. She has a $412 per application co-pay for Apligraf. She expressed the fact that it they would be able to pay this if it was felt to be necessary Objective Constitutional Patient is hypertensive.. Pulse regular and within target range for patient.[name] Respirations regular, non-labored and within target range.. Temperature is normal and within the target range for the patient.[name] Appears in no distress. Vitals Time Taken: 11:01 AM, Height: 64 in, Weight: 125 lbs, BMI: 21.5, Temperature: 98.6 F, Pulse: 91 bpm, Respiratory Rate: 20 breaths/min, Blood Pressure: 150/87 mmHg. Cardiovascular Pedal pulses palpable. General Notes:  Wound exam; the patient has quite an improvement in the lateral right calf wound. The medial area had a thick eschar on top which I removed with a #3 curette relatively unchanged from last week. The areas were washed off with Anasept and gauze but no further debridement was felt to be necessary. There is absolutely no evidence of infection around the wound to account for the patient's pain Integumentary (Hair, Skin) Wound #1 status is Open. Original cause of wound was Trauma. The wound is located on the Right,Lateral Lower Leg. The wound measures 2cm length x 2.5cm width x 0.1cm depth; 3.927cm^2 area and 0.393cm^3 volume. There is Fat Layer (Subcutaneous Tissue) Exposed exposed. There  is no tunneling or undermining noted. There is a medium amount of serosanguineous drainage noted. The wound margin is flat and intact. There is large (67-100%) red granulation within the wound bed. There is a small (1-33%) amount of necrotic tissue within the wound bed including Adherent Slough. Wound #2 status is Open. Original cause of wound was Trauma. The wound is located on the Right,Medial Lower Leg. The wound measures 1cm length x 0.5cm width x 0.1cm depth; 0.393cm^2 area and 0.039cm^3 volume. There is Fat Layer (Subcutaneous Tissue) Exposed exposed. There is no tunneling or undermining noted. There is a small amount of serosanguineous drainage noted. The wound margin is flat and intact. There is no granulation within the wound bed. There is a large (67-100%) amount of necrotic tissue within the wound bed including Eschar. Assessment Active Problems ICD-10 Chronic venous hypertension (idiopathic) with ulcer of right lower extremity Non-pressure chronic ulcer of other part of right lower leg with fat layer exposed Laceration without foreign body, right lower leg, subsequent encounter Personal history of systemic steroid therapy Procedures Wound #2 Pre-procedure diagnosis of Wound #2 is a Venous Leg Ulcer  located on the Right,Medial Lower Leg .Severity of Tissue Pre Debridement is: Fat layer exposed. There was a Selective/Open Wound Non-Viable Tissue Debridement with a total area of 0.5 sq cm performed by Ricard Dillon., MD. With the following instrument(s): Curette to remove Non-Viable tissue/material. Material removed includes Eschar after achieving pain control using Lidocaine 4% Topical Solution. A time out was conducted at 11:50, prior to the start of the procedure. A Minimum amount of bleeding was controlled with Pressure. The procedure was tolerated well with a pain level of 0 throughout and a pain level of 0 following the procedure. Post Debridement Measurements: 1cm length x 0.5cm width x 0.1cm depth; 0.039cm^3 volume. Character of Wound/Ulcer Post Debridement is improved. Severity of Tissue Post Debridement is: Fat layer exposed. Post procedure Diagnosis Wound #2: Same as Pre-Procedure Pre-procedure diagnosis of Wound #2 is a Venous Leg Ulcer located on the Right,Medial Lower Leg . There was a Three Layer Compression Therapy Procedure with a pre-treatment ABI of 1 by Baruch Gouty, RN. Post procedure Diagnosis Wound #2: Same as Pre-Procedure Wound #1 Pre-procedure diagnosis of Wound #1 is a Venous Leg Ulcer located on the Right,Lateral Lower Leg . There was a Three Layer Compression Therapy Procedure with a pre-treatment ABI of 1 by Baruch Gouty, RN. Post procedure Diagnosis Wound #1: Same as Pre-Procedure Plan Follow-up Appointments: Return Appointment in 1 week. Dressing Change Frequency: Wound #1 Right,Lateral Lower Leg: Do not change entire dressing for one week. Skin Barriers/Peri-Wound Care: Moisturizing lotion Wound Cleansing: May shower with protection. - use a cast protector. can get at [facility] Primary Wound Dressing: Wound #1 Right,Lateral Lower Leg: Polymem Silver Wound #2 Right,Medial Lower Leg: Polymem Silver Secondary Dressing: ABD pad Edema  Control: 3 Layer Compression System - Right Lower Extremity Avoid standing for long periods of time Elevate legs to the level of the heart or above for 30 minutes daily and/or when sitting, a frequency of: Exercise regularly 1. I change the primary dressing to PolyMem Ag on both wound areas. Although I doubt the Hydrofera Blue would cause the patient's descriptions I did not see it alternative good explanation. She is not a diabetic and does not have neuropathy 2. We will look at this wound next week if this improves I may forego the Apligraf however if we are stalled then we will move forward with  ordering this. The patient stated she is able to afford the $200 co-pay Electronic Signature(s) Signed: 05/11/2019 7:03:15 AM By: Linton Ham MD Entered By: Linton Ham on 05/09/2019 12:49:48 -------------------------------------------------------------------------------- SuperBill Details Patient Name: Date of Service: [name] 05/09/2019 Medical Record [id number] Patient Account Number: [id number] Date of Birth/Sex: Treating RN: 1934/08/26 (85 y.o. [name], [name] Primary Care Provider: [name] Other Clinician: Referring Provider: Treating Provider/Extender:Cynethia Schindler, Vista Lawman, Moses Manners in Treatment: 2 Diagnosis Coding ICD-10 Codes Code Description I87.311 Chronic venous hypertension (idiopathic) with ulcer of right lower extremity L97.812 Non-pressure chronic ulcer of other part of right lower leg with fat layer exposed S81.811D Laceration without foreign body, right lower leg, subsequent encounter Z92.241 Personal history of systemic steroid therapy Facility Procedures CPT4 Code Description: [id number] - DEBRIDE WOUND 1ST 20 SQ CM OR < ICD-10 Diagnosis Description L97.812 Non-pressure chronic ulcer of other part of right lower leg w Modifier: ith fat layer Quantity: 1 exposed Physician Procedures CPT4 Code Description: [id number] - WC PHYS  DEBR WO ANESTH 20 SQ CM ICD-10 Diagnosis Description L97.812 Non-pressure chronic ulcer of other part of right lower leg w Modifier: ith fat layer Quantity: 1 exposed Electronic Signature(s) Signed: 05/11/2019 7:03:15 AM By: Linton Ham MD Entered By: Linton Ham on 05/09/2019 12:50:00

## 2019-05-13 DIAGNOSIS — J849 Interstitial pulmonary disease, unspecified: Secondary | ICD-10-CM | POA: Diagnosis not present

## 2019-05-17 ENCOUNTER — Encounter (HOSPITAL_BASED_OUTPATIENT_CLINIC_OR_DEPARTMENT_OTHER): Payer: Medicare HMO | Admitting: Internal Medicine

## 2019-05-17 ENCOUNTER — Other Ambulatory Visit: Payer: Self-pay

## 2019-05-17 DIAGNOSIS — Z7952 Long term (current) use of systemic steroids: Secondary | ICD-10-CM | POA: Diagnosis not present

## 2019-05-17 DIAGNOSIS — I10 Essential (primary) hypertension: Secondary | ICD-10-CM | POA: Diagnosis not present

## 2019-05-17 DIAGNOSIS — I87311 Chronic venous hypertension (idiopathic) with ulcer of right lower extremity: Secondary | ICD-10-CM | POA: Diagnosis not present

## 2019-05-17 DIAGNOSIS — E785 Hyperlipidemia, unspecified: Secondary | ICD-10-CM | POA: Diagnosis not present

## 2019-05-17 DIAGNOSIS — I872 Venous insufficiency (chronic) (peripheral): Secondary | ICD-10-CM | POA: Diagnosis not present

## 2019-05-17 DIAGNOSIS — Z85828 Personal history of other malignant neoplasm of skin: Secondary | ICD-10-CM | POA: Diagnosis not present

## 2019-05-17 DIAGNOSIS — W2203XA Walked into furniture, initial encounter: Secondary | ICD-10-CM | POA: Diagnosis not present

## 2019-05-17 DIAGNOSIS — L97812 Non-pressure chronic ulcer of other part of right lower leg with fat layer exposed: Secondary | ICD-10-CM | POA: Diagnosis not present

## 2019-05-17 DIAGNOSIS — J849 Interstitial pulmonary disease, unspecified: Secondary | ICD-10-CM | POA: Diagnosis not present

## 2019-05-17 NOTE — Progress Notes (Signed)
[name], [name] ([id number]) Visit Report for 05/17/2019 HPI Details Patient Name: Date of Service: [name], [name] 05/17/2019 2:00 PM Medical Record [id number] Patient Account Number: [id number] Date of Birth/Sex: Treating RN: 10-Mar-1934 (85 y.o. [name] Primary Care Provider: Lavone Conway Other Clinician: Referring Provider: Treating Provider/Extender:[name], [name], [name] in Treatment: 4 History of Present Illness HPI Description: ADMISSION 04/19/2019 This is an 85 year old woman who has chronic interstitial lung disease on prednisone 10 mg a day. Towards the end of December she hit her lateral right leg on a chair. She was seen in urgent care and the area was Steri-Stripped however this did not hold together. Sometime after this traumatized the medial part of her right leg. She has some chronic edema in her lower legs probably is a secondary phenomenon to chronic venous insufficiency. She also takes chronic steroids. He saw her primary physician who is Dr. Lavone Conway at [place] and was given doxycycline on 2/8 I believe she has completed that. She is here for review of nonhealing areas on the right lateral greater than right medial calf Past medical history; includes interstitial lung disease on prednisone 10 mg and is just started Ofev. She has a history of basal cell skin cancer, hyperlipidemia. ABI in our clinic was 1.04 in the right 2/26; patient with chronic venous insufficiency and interstitial lung disease on chronic prednisone. She has a wound on her right lateral and right medial lower leg on the right which were separate incidents of trauma. These wounds started in December. She had very significant debris on both of the wounds last week which required debridement they require debridement again today. We have been using Iodoflex 3/4; the patient has two wounds on her right lower leg one laterally and one medially in the setting of chronic  venous hypertension and resultant separate incidences of trauma. The wounds actually look better in terms of surface. We have been using Iodoflex, changed to [facility] classic today 3/11; she complained of pain almost like lancinating pain in the wound on the right lateral leg. This was new this week and she attributed this to the fact that we changed her to [facility]. Previously everything was well tolerated including at least 2 weeks of Sorbact. She has a $951 per application co-pay for Apligraf. She expressed the fact that it they would be able to pay this if it was felt to be necessary 3/19; patient is complaining about the facility charge coming here. We listened to this for a while. We put polymen on this last week after she experience pain with Hydrofera Blue she seems to tolerate this better. She has an area on the right lateral and right medial leg in the setting of chronic venous insufficiency and chronic prednisone use. Although her overall surface area is not any better the wound surface certainly looks a lot better Electronic Signature(s) Signed: 05/17/2019 5:09:02 PM By: Terry Ham MD Entered By: [name] on 05/17/2019 15:12:26 -------------------------------------------------------------------------------- Physical Exam Details Patient Name: Date of Service: [name]. 05/17/2019 2:00 PM Medical Record [id number] Patient Account Number: [id number] Date of Birth/Sex: Treating RN: [birth year] (85 y.o. [name] Primary Care Provider: Lavone Conway Other Clinician: Referring Provider: Treating Provider/Extender:[name], [name], [name] in Treatment: 4 Constitutional Patient is hypertensive.. Pulse regular and within target range for patient.[name] Respirations regular, non-labored and within target range.. Temperature is normal and within the target range for the patient.[name] Appears in no distress. Notes Wound exam; the patient  has an improvement in the condition of the wound surface. Initially she had thick eschar on this this is now healthy red granulation tissue in both wounds. There is no surrounding infection. Both of them are debrided with Anasept and gauze. Pedal pulses are palpable her edema is under control Electronic Signature(s) Signed: 05/17/2019 5:09:02 PM By: Terry Ham MD Entered By: [name] on 05/17/2019 15:13:13 -------------------------------------------------------------------------------- Physician Orders Details Patient Name: Date of Service: [name]. 05/17/2019 2:00 PM Medical Record [id number] Patient Account Number: [id number] Date of Birth/Sex: Treating RN: 1935-01-23 (85 y.o. [name], [name] Primary Care Provider: Lavone Conway Other Clinician: Referring Provider: Treating Provider/Extender:[name], [name], [name] in Treatment: 4 Verbal / Phone Orders: No Diagnosis Coding ICD-10 Coding Code Description I87.311 Chronic venous hypertension (idiopathic) with ulcer of right lower extremity L97.812 Non-pressure chronic ulcer of other part of right lower leg with fat layer exposed S81.811D Laceration without foreign body, right lower leg, subsequent encounter Z92.241 Personal history of systemic steroid therapy Follow-up Appointments Return Appointment in 1 week. Dressing Change Frequency Wound #1 Right,Lateral Lower Leg Do not change entire dressing for one week. Skin Barriers/Peri-Wound Care Moisturizing lotion Wound Cleansing May shower with protection. - use a cast protector. can get at [facility] or [facility] Primary Wound Dressing Wound #1 Right,Lateral Lower Leg Polymem Silver Wound #2 Right,Medial Lower Leg Polymem Silver Secondary Dressing ABD pad Edema Control 3 Layer Compression System - Right Lower Extremity Avoid standing for long periods of time Elevate legs to the level of the heart or above for 30 minutes daily and/or when  sitting, a frequency of: Exercise regularly Electronic Signature(s) Signed: 05/17/2019 5:09:02 PM By: Terry Ham MD Signed: 05/17/2019 5:10:17 PM By: [name] Entered By: [name] on 05/17/2019 14:49:42 -------------------------------------------------------------------------------- Problem List Details Patient Name: Date of Service: [name]. 05/17/2019 2:00 PM Medical Record [id number] Patient Account Number: [id number] Date of Birth/Sex: Treating RN: [birth year] (85 y.o. [name] Primary Care Provider: Lavone Conway Other Clinician: Referring Provider: Treating Provider/Extender:Valon Glasscock, [name], [name] in Treatment: 4 Active Problems ICD-10 Evaluated Encounter Code Description Active Date Today Diagnosis I87.311 Chronic venous hypertension (idiopathic) with ulcer of 04/19/2019 No Yes right lower extremity L97.812 Non-pressure chronic ulcer of other part of right lower 04/19/2019 No Yes leg with fat layer exposed S81.811D Laceration without foreign body, right lower leg, 04/19/2019 No Yes subsequent encounter Z92.241 Personal history of systemic steroid therapy 04/19/2019 No Yes Inactive Problems Resolved Problems Electronic Signature(s) Signed: 05/17/2019 5:09:02 PM By: Terry Ham MD Entered By: [name] on 05/17/2019 15:11:18 -------------------------------------------------------------------------------- Progress Note Details Patient Name: Date of Service: [name]. 05/17/2019 2:00 PM Medical Record [id number] Patient Account Number: [id number] Date of Birth/Sex: Treating RN: Apr 20, 1934 (85 y.o. [name] Primary Care Provider: Lavone Conway Other Clinician: Referring Provider: Treating Provider/Extender:Alivea Gladson, [name], [name] in Treatment: 4 Subjective History of Present Illness (HPI) ADMISSION 04/19/2019 This is an 85 year old woman who has chronic interstitial  lung disease on prednisone 10 mg a day. Towards the end of December she hit her lateral right leg on a chair. She was seen in urgent care and the area was Steri-Stripped however this did not hold together. Sometime after this traumatized the medial part of her right leg. She has some chronic edema in her lower legs probably is a secondary phenomenon to chronic venous insufficiency. She also takes chronic steroids. He saw her primary physician who is Dr. Lavone Conway at [facility] and was given doxycycline on 2/8  I believe she has completed that. She is here for review of nonhealing areas on the right lateral greater than right medial calf Past medical history; includes interstitial lung disease on prednisone 10 mg and is just started Ofev. She has a history of basal cell skin cancer, hyperlipidemia. ABI in our clinic was 1.04 in the right 2/26; patient with chronic venous insufficiency and interstitial lung disease on chronic prednisone. She has a wound on her right lateral and right medial lower leg on the right which were separate incidents of trauma. These wounds started in December. She had very significant debris on both of the wounds last week which required debridement they require debridement again today. We have been using Iodoflex 3/4; the patient has two wounds on her right lower leg one laterally and one medially in the setting of chronic venous hypertension and resultant separate incidences of trauma. The wounds actually look better in terms of surface. We have been using Iodoflex, changed to [facility] classic today 3/11; she complained of pain almost like lancinating pain in the wound on the right lateral leg. This was new this week and she attributed this to the fact that we changed her to [facility]. Previously everything was well tolerated including at least 2 weeks of Sorbact. She has a $038 per application co-pay for Apligraf. She expressed the fact that it they would be  able to pay this if it was felt to be necessary 3/19; patient is complaining about the facility charge coming here. We listened to this for a while. We put polymen on this last week after she experience pain with Hydrofera Blue she seems to tolerate this better. She has an area on the right lateral and right medial leg in the setting of chronic venous insufficiency and chronic prednisone use. Although her overall surface area is not any better the wound surface certainly looks a lot better Objective Constitutional Patient is hypertensive.. Pulse regular and within target range for patient.[name] Respirations regular, non-labored and within target range.. Temperature is normal and within the target range for the patient.[name] Appears in no distress. Vitals Time Taken: 2:12 PM, Height: 64 in, Weight: 125 lbs, BMI: 21.5, Temperature: 98.3 F, Pulse: 106 bpm, Respiratory Rate: 24 breaths/min, Blood Pressure: 158/75 mmHg. General Notes: Wound exam; the patient has an improvement in the condition of the wound surface. Initially she had thick eschar on this this is now healthy red granulation tissue in both wounds. There is no surrounding infection. Both of them are debrided with Anasept and gauze. Pedal pulses are palpable her edema is under control Integumentary (Hair, Skin) Wound #1 status is Open. Original cause of wound was Trauma. The wound is located on the Right,Lateral Lower Leg. The wound measures 2cm length x 2.7cm width x 0.1cm depth; 4.241cm^2 area and 0.424cm^3 volume. There is Fat Layer (Subcutaneous Tissue) Exposed exposed. There is no tunneling or undermining noted. There is a medium amount of serosanguineous drainage noted. The wound margin is flat and intact. There is large (67-100%) red granulation within the wound bed. There is a small (1-33%) amount of necrotic tissue within the wound bed including Adherent Slough. Wound #2 status is Open. Original cause of wound was Trauma. The wound is  located on the Right,Medial Lower Leg. The wound measures 1.1cm length x 0.5cm width x 0.2cm depth; 0.432cm^2 area and 0.086cm^3 volume. There is Fat Layer (Subcutaneous Tissue) Exposed exposed. There is no tunneling or undermining noted. There is a small amount of serosanguineous  drainage noted. The wound margin is flat and intact. There is no granulation within the wound bed. There is a large (67-100%) amount of necrotic tissue within the wound bed including Eschar. Assessment Active Problems ICD-10 Chronic venous hypertension (idiopathic) with ulcer of right lower extremity Non-pressure chronic ulcer of other part of right lower leg with fat layer exposed Laceration without foreign body, right lower leg, subsequent encounter Personal history of systemic steroid therapy Procedures Wound #1 Pre-procedure diagnosis of Wound #1 is a Venous Leg Ulcer located on the Right,Lateral Lower Leg . There was a Three Layer Compression Therapy Procedure by Deon Pilling, RN. Post procedure Diagnosis Wound #1: Same as Pre-Procedure Wound #2 Pre-procedure diagnosis of Wound #2 is a Venous Leg Ulcer located on the Right,Medial Lower Leg . There was a Three Layer Compression Therapy Procedure by Deon Pilling, RN. Post procedure Diagnosis Wound #2: Same as Pre-Procedure Plan Follow-up Appointments: Return Appointment in 1 week. Dressing Change Frequency: Wound #1 Right,Lateral Lower Leg: Do not change entire dressing for one week. Skin Barriers/Peri-Wound Care: Moisturizing lotion Wound Cleansing: May shower with protection. - use a cast protector. can get at [facility] Primary Wound Dressing: Wound #1 Right,Lateral Lower Leg: Polymem Silver Wound #2 Right,Medial Lower Leg: Polymem Silver Secondary Dressing: ABD pad Edema Control: 3 Layer Compression System - Right Lower Extremity Avoid standing for long periods of time Elevate legs to the level of the heart or above for 30 minutes  daily and/or when sitting, a frequency of: Exercise regularly 1. continue with PolyMem Ag for another week under 3 layer compression 2. We have talked about Apligraf and the out-of-pocket cost of 088 per application on the other hand she would only have to come in and see me twice a week with a nurse visit on alternate weeks Electronic Signature(s) Signed: 05/17/2019 5:09:02 PM By: Terry Ham MD Entered By: [name] on 05/17/2019 15:13:59 -------------------------------------------------------------------------------- SuperBill Details Patient Name: Date of Service: [name] 05/17/2019 Medical Record [id number] Patient Account Number: [id number] Date of Birth/Sex: Treating RN: 20-May-1934 (85 y.o. [name] Primary Care Provider: Lavone Conway Other Clinician: Referring Provider: Treating Provider/Extender:Mohamed Portlock, [name], [name] in Treatment: 4 Diagnosis Coding ICD-10 Codes Code Description I87.311 Chronic venous hypertension (idiopathic) with ulcer of right lower extremity L97.812 Non-pressure chronic ulcer of other part of right lower leg with fat layer exposed S81.811D Laceration without foreign body, right lower leg, subsequent encounter Z92.241 Personal history of systemic steroid therapy Facility Procedures CPT4 Code Description: [id number] (Facility Use Only) [id number] - [place] [facility] RT LEG Modifier: Quantity: 1 Physician Procedures CPT4 Code Description: [id number] [place] - WC PHYS LEVEL 3 - EST PT ICD-10 Diagnosis Description L97.812 Non-pressure chronic ulcer of other part of right lower I87.311 Chronic venous hypertension (idiopathic) with ulcer of r Z92.241 Personal history of  systemic steroid therapy S81.811D Laceration without foreign body, right lower leg, subseq Modifier: leg with fat la ight lower extr uent encounter Quantity: 1 yer exposed Engineer, site Signature(s) Signed: 05/17/2019 5:09:02 PM By: Terry Ham MD Entered By: [name] on 05/17/2019 15:14:19

## 2019-05-17 NOTE — Progress Notes (Signed)
Terry, Conway ([id number]) Visit Report for 05/17/2019 Arrival Information Details Patient Name: Date of Service: [name], [name] 05/17/2019 2:00 PM Medical Record [id number] Patient Account Number: [id number] Date of Birth/Sex: Treating RN: June 08, 1934 (85 y.o. [name] Primary Care [name]: [name] Other Clinician: Referring [name]: Treating [name]/Extender:[name], [name], [name] in Treatment: 4 Visit Information History Since Last Visit All ordered tests and consults were completed: No Patient Arrived: Ambulatory Added or deleted any medications: No Arrival Time: 14:11 Any new allergies or adverse reactions: No Accompanied By: self Had a fall or experienced change in No Transfer Assistance: None activities of daily living that may affect Patient Identification Verified: Yes risk of falls: Secondary Verification Process Completed: Yes Signs or symptoms of abuse/neglect since last No Patient Requires Transmission-Based No visito Precautions: Hospitalized since last visit: No Patient Has Alerts: No Implantable device outside of the clinic excluding No cellular tissue based products placed in the center since last visit: Has Dressing in Place as Prescribed: Yes Has Compression in Place as Prescribed: Yes Pain Present Now: No Electronic Signature(s) Signed: 05/17/2019 5:08:14 PM By: Terry Coria RN Entered By: [name] on 05/17/2019 14:12:22 -------------------------------------------------------------------------------- Compression Therapy Details Patient Name: Date of Service: [name], [name] 05/17/2019 2:00 PM Medical Record [id number] Patient Account Number: [id number] Date of Birth/Sex: Treating RN: 05/17/1934 (85 y.o. [name] Primary Care Advith Conway: [name] Other Clinician: Referring Saidee Geremia: Treating Akshaj Besancon/Extender:[name], [name], [name] in Treatment: 4 Compression Therapy  Performed for Wound Wound #1 Right,Lateral Lower Leg Assessment: Performed By: Clinician Terry Pilling, RN Compression Type: Three Layer Post Procedure Diagnosis Same as Pre-procedure Electronic Signature(s) Signed: 05/17/2019 5:10:17 PM By: [name] Entered By: [name] on 05/17/2019 14:59:18 -------------------------------------------------------------------------------- Compression Therapy Details Patient Name: Date of Service: [name], [name] 05/17/2019 2:00 PM Medical Record [id number] Patient Account Number: [id number] Date of Birth/Sex: Treating RN: 1934/09/15 (85 y.o. [name] Primary Care [name]: [name] Other Clinician: Referring Aina Rossbach: Treating [name]/Extender:[name], [name], [name] in Treatment: 4 Compression Therapy Performed for Wound Wound #2 Right,Medial Lower Leg Assessment: Performed By: Clinician Terry Pilling, RN Compression Type: Three Layer Post Procedure Diagnosis Same as Pre-procedure Electronic Signature(s) Signed: 05/17/2019 5:10:17 PM By: [name] Entered By: [name] on 05/17/2019 14:59:19 -------------------------------------------------------------------------------- Encounter Discharge Information Details Patient Name: Date of Service: Terry Lorenzo. 05/17/2019 2:00 PM Medical Record [id number] Patient Account Number: [id number] Date of Birth/Sex: Treating RN: [birth year] (85 y.o. [name], [name] Primary Care [name]: [name] Other Clinician: Referring [name]: Treating [name]/Extender:[name], [name], [name] in Treatment: 4 Encounter Discharge Information Items Discharge Condition: Stable Ambulatory Status: Ambulatory Discharge Destination: Home Transportation: Private Auto Accompanied By: self Schedule Follow-up Appointment: Yes Clinical Summary of Care: Electronic Signature(s) Signed: 05/17/2019 5:19:35 PM By: [name] Entered By: [name] on 05/17/2019 15:42:00 -------------------------------------------------------------------------------- Lower Extremity Assessment Details Patient Name: Date of Service: [name], [name] 05/17/2019 2:00 PM Medical Record [id number] Patient Account Number: [id number] Date of Birth/Sex: Treating RN: 1934/05/14 (85 y.o. [name] Primary Care Orvie Caradine: [name] Other Clinician: Referring [name]: Treating [name]/Extender:[name], [name], [name] in Treatment: 4 Edema Assessment Assessed: [Left: No] [Right: No] Edema: [Left: Ye] [Right: s] Calf Left: Right: Point of Measurement: cm From Medial Instep cm 32 cm Ankle Left: Right: Point of Measurement: cm From Medial Instep cm 21.5 cm Electronic Signature(s) Signed: 05/17/2019 5:08:14 PM By: Terry Coria RN Entered By: [name] on 05/17/2019 14:12:51 -------------------------------------------------------------------------------- Multi Wound Chart Details Patient Name:  Date of Service: [name], [name] 05/17/2019 2:00 PM Medical Record [id number] Patient Account Number: [id number] Date of Birth/Sex: Treating RN: [birth year] (85 y.o. [name] Primary Care Lequan Dobratz: [name] Other Clinician: Referring [name]: Treating [name]/Extender:[name], [name], [name] in Treatment: 4 Vital Signs Height(in): 64 Pulse(bpm): 106 Weight(lbs): 125 Blood Pressure(mmHg): 158/75 Body Mass Index(BMI): 21 Temperature(F): 98.3 Respiratory 24 Rate(breaths/min): Photos: [1:No Photos] [2:No Photos] [N/A:N/A] Wound Location: [1:Right Lower Leg - Lateral Right Lower Leg - Medial N/A] Wounding Event: [1:Trauma] [2:Trauma] [N/A:N/A] Primary Etiology: [1:Venous Leg Ulcer] [2:Venous Leg Ulcer] [N/A:N/A] Comorbid History: [1:Cataracts, Confinement Anxiety] [2:Cataracts, Confinement Anxiety] [N/A:N/A] Date Acquired: [1:02/27/2019] [2:02/17/2019]  [N/A:N/A] Weeks of Treatment: [1:4] [2:4] [N/A:N/A] Wound Status: [1:Open] [2:Open] [N/A:N/A] Measurements L x W x D 2x2.7x0.1 [2:1.1x0.5x0.2] [N/A:N/A] (cm) Area (cm) : [1:4.241] [2:0.432] [N/A:N/A] Volume (cm) : [1:0.424] [2:0.086] [N/A:N/A] % Reduction in Area: [1:-42.80%] [2:-83.10%] [N/A:N/A] % Reduction in Volume: -42.80% [2:-258.30%] [N/A:N/A] Classification: [1:Full Thickness Without Exposed Support Structures Exposed Support Structures] [2:Full Thickness Without] [N/A:N/A] Exudate Amount: [1:Medium] [2:Small] [N/A:N/A] Exudate Type: [1:Serosanguineous] [2:Serosanguineous] [N/A:N/A] Exudate Color: [1:red, brown] [2:red, brown] [N/A:N/A] Wound Margin: [1:Flat and Intact] [2:Flat and Intact] [N/A:N/A] Granulation Amount: [1:Large (67-100%)] [2:None Present (0%)] [N/A:N/A] Granulation Quality: [1:Red] [2:N/A] [N/A:N/A] Necrotic Amount: [1:Small (1-33%)] [2:Large (67-100%)] [N/A:N/A] Necrotic Tissue: [1:Adherent Slough] [2:Eschar] [N/A:N/A] Exposed Structures: [1:Fat Layer (Subcutaneous Fat Layer (Subcutaneous N/A Tissue) Exposed: Yes Fascia: No Tendon: No Muscle: No Joint: No Bone: No] [2:Tissue) Exposed: Yes Fascia: No Tendon: No Muscle: No Joint: No Bone: No] Epithelialization: [1:Small (1-33%)] [2:Small (1-33%) Compression Therapy] [N/A:N/A N/A] Treatment Notes Electronic Signature(s) Signed: 05/17/2019 5:09:02 PM By: Linton Ham MD Signed: 05/17/2019 5:10:17 PM By: [name] Entered By: Linton Ham on 05/17/2019 15:11:32 -------------------------------------------------------------------------------- Multi-Disciplinary Care Plan Details Patient Name: Date of Service: Terry Lorenzo. 05/17/2019 2:00 PM Medical Record [id number] Patient Account Number: [id number] Date of Birth/Sex: Treating RN: December 13, 1934 (85 y.o. [name] Primary Care Brook Geraci: Other Clinician: Lavone Conway Referring Siearra Amberg: Treating Marceline Napierala/Extender:[name],  [name], [name] in Treatment: 4 Active Inactive Pain, Acute or Chronic Nursing Diagnoses: Pain, acute or chronic: actual or potential Goals: Patient/caregiver will verbalize adequate pain control between visits Date Initiated: 04/19/2019 Target Resolution Date: 05/31/2019 Goal Status: Active Interventions: Provide education on pain management Notes: Wound/Skin Impairment Nursing Diagnoses: Impaired tissue integrity Goals: Ulcer/skin breakdown will have a volume reduction of 30% by week 4 Date Initiated: 04/19/2019 Target Resolution Date: 05/24/2019 Goal Status: Active Interventions: Provide education on ulcer and skin care Notes: Electronic Signature(s) Signed: 05/17/2019 5:10:17 PM By: [name] Entered By: [name] on 05/17/2019 14:49:54 -------------------------------------------------------------------------------- Pain Assessment Details Patient Name: Date of Service: [name], [name] 05/17/2019 2:00 PM Medical Record [id number] Patient Account Number: [id number] Date of Birth/Sex: Treating RN: [birth year] (85 y.o. [name] Primary Care Zyara Riling: [name] Other Clinician: Referring Scotlyn Mccranie: Treating Simran Mannis/Extender:[name], [name], [name] in Treatment: 4 Active Problems Location of Pain Severity and Description of Pain Patient Has Paino No Site Locations Pain Management and Medication Current Pain Management: Electronic Signature(s) Signed: 05/17/2019 5:08:14 PM By: Terry Coria RN Entered By: [name] on 05/17/2019 14:12:46 -------------------------------------------------------------------------------- Patient/Caregiver Education Details Patient Name: Date of Service: Terry Lorenzo 3/19/2021andnbsp2:00 PM Medical Record [id number] Patient Account Number: [id number] Date of Birth/Gender: Treating RN: 1934/09/27 (85 y.o. [name] Primary Care Physician: [name] Other  Clinician: Referring Physician: Treating Physician/Extender:[name], [name], [name] in Treatment: 4 Education Assessment Education Provided To: Patient Education Topics Provided Pain: Methods: Explain/Verbal Responses:  State content correctly Wound/Skin Impairment: Methods: Explain/Verbal Responses: State content correctly Electronic Signature(s) Signed: 05/17/2019 5:10:17 PM By: [name] Entered By: [name] on 05/17/2019 14:50:12 -------------------------------------------------------------------------------- Wound Assessment Details Patient Name: Date of Service: [name], [name] 05/17/2019 2:00 PM Medical Record [id number] Patient Account Number: [id number] Date of Birth/Sex: Treating RN: 02-03-1935 (85 y.o. [name] Primary Care Telisha Zawadzki: [name] Other Clinician: Referring Taesha Goodell: Treating Suki Crockett/Extender:[name], [name], [name] in Treatment: 4 Wound Status Wound Number: 1 Primary Etiology: Venous Leg Ulcer Wound Location: Right Lower Leg - Lateral Wound Status: Open Wounding Event: Trauma Comorbid History: Cataracts, Confinement Anxiety Date Acquired: 02/27/2019 Weeks Of Treatment: 4 Clustered Wound: No Wound Measurements Length: (cm) 2 % Reduct Width: (cm) 2.7 % Reduct Depth: (cm) 0.1 Epitheli Area: (cm) 4.241 Tunneli Volume: (cm) 0.424 Undermi Wound Description Full Thickness Without Exposed Support Foul Od Classification: Structures Slough/ Wound Flat and Intact Margin: Exudate Medium Amount: Exudate Serosanguineous Type: Exudate red, brown Color: Wound Bed Granulation Amount: Large (67-100%) Granulation Quality: Red Fascia Necrotic Amount: Small (1-33%) Fat Lay Necrotic Quality: Adherent Slough Tendon Muscle Joint E Bone Ex or After Cleansing: No Fibrino Yes Exposed Structure Exposed: No er (Subcutaneous Tissue) Exposed: Yes Exposed: No Exposed: No xposed: No posed:  No ion in Area: -42.8% ion in Volume: -42.8% alization: Small (1-33%) ng: No ning: No Treatment Notes Wound #1 (Right, Lateral Lower Leg) 1. Cleanse With Wound Cleanser Soap and water 2. Periwound Care Moisturizing lotion 3. Primary Dressing Applied Polymem Ag 4. Secondary Dressing Dry Gauze 6. Support Layer Applied 3 layer compression wrap Notes netting. Electronic Signature(s) Signed: 05/17/2019 5:08:14 PM By: Terry Coria RN Entered By: [name] on 05/17/2019 14:13:11 -------------------------------------------------------------------------------- Wound Assessment Details Patient Name: Date of Service: [name], [name] 05/17/2019 2:00 PM Medical Record [id number] Patient Account Number: [id number] Date of Birth/Sex: Treating RN: 1934-03-14 (85 y.o. [name] Primary Care Saylor Sheckler: [name] Other Clinician: Referring Domonick Sittner: Treating Elinor Kleine/Extender:[name], [name], [name] in Treatment: 4 Wound Status Wound Number: 2 Primary Etiology: Venous Leg Ulcer Wound Location: Right Lower Leg - Medial Wound Status: Open Wounding Event: Trauma Comorbid History: Cataracts, Confinement Anxiety Date Acquired: 02/17/2019 Weeks Of Treatment: 4 Clustered Wound: No Wound Measurements Length: (cm) 1.1 % Reduct Width: (cm) 0.5 % Reduct Depth: (cm) 0.2 Epitheli Area: (cm) 0.432 Tunneli Volume: (cm) 0.086 Undermi Wound Description Classification: Full Thickness Without Exposed Support Foul Od Structures Slough/ Wound Flat and Intact Margin: Exudate Small Amount: Exudate Serosanguineous Type: Exudate red, brown Color: Wound Bed Granulation Amount: None Present (0%) Necrotic Amount: Large (67-100%) Fascia Expo Necrotic Quality: Eschar Fat Layer ( Tendon Expo Muscle Expo Joint Expos Bone Expose or After Cleansing: No Fibrino Yes Exposed Structure sed: No Subcutaneous Tissue) Exposed: Yes sed: No sed: No ed: No d: No ion in  Area: -83.1% ion in Volume: -258.3% alization: Small (1-33%) ng: No ning: No Treatment Notes Wound #2 (Right, Medial Lower Leg) 1. Cleanse With Wound Cleanser Soap and water 2. Periwound Care Moisturizing lotion 3. Primary Dressing Applied Polymem Ag 4. Secondary Dressing Dry Gauze 6. Support Layer Applied 3 layer compression wrap Notes netting. Electronic Signature(s) Signed: 05/17/2019 5:08:14 PM By: Terry Coria RN Entered By: [name] on 05/17/2019 14:14:30 -------------------------------------------------------------------------------- Vitals Details Patient Name: Date of Service: Terry Lorenzo. 05/17/2019 2:00 PM Medical Record [id number] Patient Account Number: [id number] Date of Birth/Sex: Treating RN: 09-12-1934 (85 y.o. [name] Primary Care Zailyn Rowser: [name] Other Clinician: Referring Shoshanna Mcquitty: Treating Susumu Hackler/Extender:[name], [name],  John Weeks in Treatment: 4 Vital Signs Time Taken: 14:12 Temperature (F): 98.3 Height (in): 64 Pulse (bpm): 106 Weight (lbs): 125 Respiratory Rate (breaths/min): 24 Body Mass Index (BMI): 21.5 Blood Pressure (mmHg): 158/75 Reference Range: 80 - 120 mg / dl Electronic Signature(s) Signed: 05/17/2019 5:08:14 PM By: Terry Coria RN Entered By: [name] on 05/17/2019 14:15:32

## 2019-05-20 ENCOUNTER — Telehealth: Payer: Self-pay | Admitting: Internal Medicine

## 2019-05-20 DIAGNOSIS — J849 Interstitial pulmonary disease, unspecified: Secondary | ICD-10-CM | POA: Diagnosis not present

## 2019-05-20 NOTE — Telephone Encounter (Signed)
Called and spoke with pt letting her know that she could come to office anytime this week to have labwork done and stated to her that order is already placed. Pt verbalized understanding. Nothing further needed.

## 2019-05-20 NOTE — Telephone Encounter (Signed)
ATC patient.  LM  to call back.

## 2019-05-20 NOTE — Telephone Encounter (Signed)
Pt called again. Please call back  [phone]

## 2019-05-22 ENCOUNTER — Telehealth: Payer: Self-pay | Admitting: Internal Medicine
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
## 2019-05-23 ENCOUNTER — Ambulatory Visit: Payer: Medicare HMO | Admitting: Adult Health

## 2019-05-23 ENCOUNTER — Encounter: Payer: Self-pay | Admitting: Adult Health

## 2019-05-23 ENCOUNTER — Other Ambulatory Visit: Payer: Self-pay

## 2019-05-23 ENCOUNTER — Ambulatory Visit (INDEPENDENT_AMBULATORY_CARE_PROVIDER_SITE_OTHER): Payer: Medicare HMO

## 2019-05-23 DIAGNOSIS — J9611 Chronic respiratory failure with hypoxia: Secondary | ICD-10-CM | POA: Diagnosis not present

## 2019-05-23 DIAGNOSIS — J849 Interstitial pulmonary disease, unspecified: Secondary | ICD-10-CM | POA: Diagnosis not present

## 2019-05-23 DIAGNOSIS — Z5181 Encounter for therapeutic drug level monitoring: Secondary | ICD-10-CM

## 2019-05-23 DIAGNOSIS — J84112 Idiopathic pulmonary fibrosis: Secondary | ICD-10-CM | POA: Diagnosis not present

## 2019-05-23 MED ORDER — PREDNISONE 10 MG PO TABS: ORAL_TABLET | ORAL | 0 refills | Status: DC

## 2019-05-23 NOTE — Patient Instructions (Addendum)
Increase Prednisone 78m daily for 1 week then 111mdaily .  Chest xray today .  Continue on OFEV .  Activity as tolerated.  Continue on Oxygen 2l/m rest and 4l/m with activity and At bedtime  .  Follow up with [facility] as planned next month .  Follow up with Dr. RaChase Callern 3 months  and As needed   Please contact office for sooner follow up if symptoms do not improve or worsen or seek emergency care

## 2019-05-23 NOTE — Telephone Encounter (Signed)
Spoke with patient. She has been scheduled to see TP today at 430pm (only appointment available for today). She is aware to arrive a few minutes early for her LFT.   Nothing further needed at time of call.
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
## 2019-05-24 ENCOUNTER — Encounter (HOSPITAL_BASED_OUTPATIENT_CLINIC_OR_DEPARTMENT_OTHER): Payer: Medicare HMO | Admitting: Internal Medicine

## 2019-05-24 DIAGNOSIS — Z85828 Personal history of other malignant neoplasm of skin: Secondary | ICD-10-CM | POA: Diagnosis not present

## 2019-05-24 DIAGNOSIS — Z7952 Long term (current) use of systemic steroids: Secondary | ICD-10-CM | POA: Diagnosis not present

## 2019-05-24 DIAGNOSIS — W2203XA Walked into furniture, initial encounter: Secondary | ICD-10-CM | POA: Diagnosis not present

## 2019-05-24 DIAGNOSIS — J9611 Chronic respiratory failure with hypoxia: Secondary | ICD-10-CM | POA: Insufficient documentation

## 2019-05-24 DIAGNOSIS — J849 Interstitial pulmonary disease, unspecified: Secondary | ICD-10-CM | POA: Diagnosis not present

## 2019-05-24 DIAGNOSIS — I872 Venous insufficiency (chronic) (peripheral): Secondary | ICD-10-CM | POA: Diagnosis not present

## 2019-05-24 DIAGNOSIS — I10 Essential (primary) hypertension: Secondary | ICD-10-CM | POA: Diagnosis not present

## 2019-05-24 DIAGNOSIS — Z92241 Personal history of systemic steroid therapy: Secondary | ICD-10-CM | POA: Diagnosis not present

## 2019-05-24 DIAGNOSIS — L97812 Non-pressure chronic ulcer of other part of right lower leg with fat layer exposed: Secondary | ICD-10-CM | POA: Diagnosis not present

## 2019-05-24 DIAGNOSIS — I87311 Chronic venous hypertension (idiopathic) with ulcer of right lower extremity: Secondary | ICD-10-CM | POA: Diagnosis not present

## 2019-05-24 DIAGNOSIS — E785 Hyperlipidemia, unspecified: Secondary | ICD-10-CM | POA: Diagnosis not present

## 2019-05-24 LAB — HEPATIC FUNCTION PANEL
ALT: 23 U/L (ref 0–35)
AST: 24 U/L (ref 0–37)
Albumin: 4 g/dL (ref 3.5–5.2)
Alkaline Phosphatase: 62 U/L (ref 39–117)
Bilirubin, Direct: 0.1 mg/dL (ref 0.0–0.3)
Total Bilirubin: 0.7 mg/dL (ref 0.2–1.2)
Total Protein: 7.1 g/dL (ref 6.0–8.3)

## 2019-05-24 NOTE — Assessment & Plan Note (Signed)
Continue on oxygen to keep O2 saturations greater than 88 to 90%

## 2019-05-24 NOTE — Progress Notes (Signed)
LFT normal
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
## 2019-05-25 NOTE — Progress Notes (Signed)
[name], [name] ([id number]) Visit Report for 05/24/2019 Arrival Information Details Patient Name: Date of Service: [name], [name] 05/24/2019 11:15 AM Medical Record [id number] Patient Account Number: [id number] Date of Birth/Sex: Treating RN: 1934-03-18 (85 y.o. [name], [name] Primary Care Mrytle Bento: [name] Other Clinician: Referring Dequandre Cordova: Treating Breauna Mazzeo/Extender:Robson, Vista Lawman, Moses Manners in Treatment: 5 Visit Information History Since Last Visit Added or deleted any medications: No Patient Arrived: Ambulatory Any new allergies or adverse reactions: No Arrival Time: 11:48 Had a fall or experienced change in No Accompanied By: self activities of daily living that may affect Transfer Assistance: None risk of falls: Patient Identification Verified: Yes Signs or symptoms of abuse/neglect since last No Secondary Verification Process Completed: Yes visito Patient Requires Transmission-Based No Hospitalized since last visit: No Precautions: Implantable device outside of the clinic excluding No Patient Has Alerts: No cellular tissue based products placed in the center since last visit: Has Dressing in Place as Prescribed: Yes Has Compression in Place as Prescribed: Yes Pain Present Now: No Electronic Signature(s) Signed: 05/24/2019 5:34:10 PM By: Baruch Gouty RN, BSN Entered By: Baruch Gouty on 05/24/2019 11:48:37 -------------------------------------------------------------------------------- Compression Therapy Details Patient Name: Date of Service: [name] 05/24/2019 11:15 AM Medical Record [id number] Patient Account Number: [id number] Date of Birth/Sex: Treating RN: Aug 08, 1934 (85 y.o. [name] Primary Care Patriece Archbold: [name] Other Clinician: Referring Kariyah Baugh: Treating Akili Corsetti/Extender:Robson, Vista Lawman, Moses Manners in Treatment: 5 Compression Therapy Performed for Wound Wound #1 Right,Lateral  Lower Leg Assessment: Performed By: Clinician Deon Pilling, RN Compression Type: Three Layer Post Procedure Diagnosis Same as Pre-procedure Electronic Signature(s) Signed: 05/24/2019 5:41:24 PM By: Kela Millin Entered By: Kela Millin on 05/24/2019 12:30:41 -------------------------------------------------------------------------------- Compression Therapy Details Patient Name: Date of Service: [name], [name] 05/24/2019 11:15 AM Medical Record [id number] Patient Account Number: [id number] Date of Birth/Sex: Treating RN: [birth year] (85 y.o. [name] Primary Care Mindi Akerson: [name] Other Clinician: Referring Deddrick Saindon: Treating Noemie Devivo/Extender:Robson, Vista Lawman, Moses Manners in Treatment: 5 Compression Therapy Performed for Wound Wound #2 Right,Medial Lower Leg Assessment: Performed By: Clinician Deon Pilling, RN Compression Type: Three Layer Post Procedure Diagnosis Same as Pre-procedure Electronic Signature(s) Signed: 05/24/2019 5:41:24 PM By: Kela Millin Entered By: Kela Millin on 05/24/2019 12:30:41 -------------------------------------------------------------------------------- Encounter Discharge Information Details Patient Name: Date of Service: [name], [name] 05/24/2019 11:15 AM Medical Record [id number] Patient Account Number: [id number] Date of Birth/Sex: Treating RN: June 21, 1934 (86 y.o. Debby Bud Primary Care Alayiah Fontes: [name] Other Clinician: Referring Lorina Duffner: Treating Zhaire Locker/Extender:Robson, Vista Lawman, Moses Manners in Treatment: 5 Encounter Discharge Information Items Discharge Condition: Stable Ambulatory Status: Ambulatory Discharge Destination: Home Transportation: Private Auto Accompanied By: self Schedule Follow-up Appointment: Yes Clinical Summary of Care: Electronic Signature(s) Signed: 05/24/2019 5:42:35 PM By: Deon Pilling Entered By: Deon Pilling on 05/24/2019  13:44:40 -------------------------------------------------------------------------------- Lower Extremity Assessment Details Patient Name: Date of Service: [name], [name] 05/24/2019 11:15 AM Medical Record [id number] Patient Account Number: [id number] Date of Birth/Sex: Treating RN: [birth year] (85 y.o. [name] Primary Care Nima Bamburg: [name] Other Clinician: Referring Jeshurun Oaxaca: Treating Marisela Line/Extender:Robson, Vista Lawman, Moses Manners in Treatment: 5 Edema Assessment Assessed: [Left: No] [Right: No] Edema: [Left: Ye] [Right: s] Calf Left: Right: Point of Measurement: cm From Medial Instep cm 31.5 cm Ankle Left: Right: Point of Measurement: cm From Medial Instep cm 21.3 cm Vascular Assessment Pulses: Dorsalis Pedis Palpable: [Right:Yes] Electronic Signature(s) Signed: 05/24/2019 5:34:10 PM By: Baruch Gouty RN, BSN Entered By: Baruch Gouty on 05/24/2019 11:47:15 -------------------------------------------------------------------------------- Multi Wound Chart Details Patient  Name: Date of Service: [name], [name] 05/24/2019 11:15 AM Medical Record [id number] Patient Account Number: [id number] Date of Birth/Sex: Treating RN: 03-18-1934 (85 y.o. [name] Primary Care Dereke Neumann: [name] Other Clinician: Referring Vere Diantonio: Treating Alie Hardgrove/Extender:Robson, Vista Lawman, Moses Manners in Treatment: 5 Vital Signs Height(in): 64 Pulse(bpm): 81 Weight(lbs): 125 Blood Pressure(mmHg): 127/83 Body Mass Index(BMI): 21 Temperature(F): 98.4 Respiratory 20 Rate(breaths/min): Photos: [1:No Photos] [2:No Photos] [N/A:N/A] Wound Location: [1:Right Lower Leg - Lateral Right Lower Leg - Medial N/A] Wounding Event: [1:Trauma] [2:Trauma] [N/A:N/A] Primary Etiology: [1:Venous Leg Ulcer] [2:Venous Leg Ulcer] [N/A:N/A] Comorbid History: [1:Cataracts, Confinement Anxiety] [2:Cataracts, Confinement Anxiety] [N/A:N/A] Date Acquired:  [1:02/27/2019] [2:02/17/2019] [N/A:N/A] Weeks of Treatment: [1:5] [2:5] [N/A:N/A] Wound Status: [1:Open] [2:Open] [N/A:N/A] Measurements L x W x D 1.9x2.5x0.1 [2:0.9x0.6x0.1] [N/A:N/A] (cm) Area (cm) : [1:3.731] [2:0.424] [N/A:N/A] Volume (cm) : [1:0.373] [2:0.042] [N/A:N/A] % Reduction in Area: [1:-25.70%] [2:-79.70%] [N/A:N/A] % Reduction in Volume: -25.60% [2:-75.00%] [N/A:N/A] Classification: [1:Full Thickness Without Exposed Support Structures Exposed Support Structures] [2:Full Thickness Without] [N/A:N/A] Exudate Amount: [1:Small] [2:Small] [N/A:N/A] Exudate Type: [1:Serosanguineous] [2:Serosanguineous] [N/A:N/A] Exudate Color: [1:red, brown] [2:red, brown] [N/A:N/A] Wound Margin: [1:Flat and Intact] [2:Flat and Intact] [N/A:N/A] Granulation Amount: [1:Medium (34-66%)] [2:Large (67-100%)] [N/A:N/A] Granulation Quality: [1:Red] [2:Red] [N/A:N/A] Necrotic Amount: [1:Medium (34-66%)] [2:None Present (0%)] [N/A:N/A] Exposed Structures: [1:Fat Layer (Subcutaneous Fat Layer (Subcutaneous N/A Tissue) Exposed: Yes Fascia: No Tendon: No Muscle: No Joint: No Bone: No] [2:Tissue) Exposed: Yes Fascia: No Tendon: No Muscle: No Joint: No Bone: No] Epithelialization: [1:Small (1-33%)] [2:Small (1-33%) Compression Therapy] [N/A:N/A N/A] Treatment Notes Electronic Signature(s) Signed: 05/24/2019 5:41:24 PM By: Kela Millin Signed: 05/25/2019 8:02:09 AM By: Linton Ham MD Entered By: Linton Ham on 05/24/2019 13:08:34 -------------------------------------------------------------------------------- Multi-Disciplinary Care Plan Details Patient Name: Date of Service: [name], [name] 05/24/2019 11:15 AM Medical Record [id number] Patient Account Number: [id number] Date of Birth/Sex: Treating RN: 05-29-1934 (85 y.o. [name] Primary Care Jackalynn Art: [name] Other Clinician: Referring Jovanni Rash: Treating Gerica Koble/Extender:Robson, Vista Lawman, Moses Manners in  Treatment: 5 Active Inactive Pain, Acute or Chronic Nursing Diagnoses: Pain, acute or chronic: actual or potential Goals: Patient/caregiver will verbalize adequate pain control between visits Date Initiated: 04/19/2019 Target Resolution Date: 05/31/2019 Goal Status: Active Interventions: Provide education on pain management Notes: Wound/Skin Impairment Nursing Diagnoses: Impaired tissue integrity Goals: Ulcer/skin breakdown will have a volume reduction of 30% by week 4 Date Initiated: 04/19/2019 Target Resolution Date: 05/24/2019 Goal Status: Active Interventions: Provide education on ulcer and skin care Notes: Electronic Signature(s) Signed: 05/24/2019 5:41:24 PM By: Kela Millin Entered By: Kela Millin on 05/24/2019 11:25:44 -------------------------------------------------------------------------------- Pain Assessment Details Patient Name: Date of Service: [name], [name] 05/24/2019 11:15 AM Medical Record [id number] Patient Account Number: [id number] Date of Birth/Sex: Treating RN: Mar 31, 1934 (85 y.o. [name] Primary Care Janiqua Friscia: [name] Other Clinician: Referring Yevonne Yokum: Treating Morgaine Kimball/Extender:Robson, Vista Lawman, Moses Manners in Treatment: 5 Active Problems Location of Pain Severity and Description of Pain Patient Has Paino No Site Locations Rate the pain. Current Pain Level: 0 Worst Pain Level: 7 Character of Pain Describe the Pain: Sharp, Tender Pain Management and Medication Current Pain Management: Is the Current Pain Management Adequate: Adequate How does your wound impact your activities of daily livingo Sleep: No Bathing: No Appetite: No Relationship With Others: No Bladder Continence: No Emotions: No Bowel Continence: No Work: No Toileting: No Drive: No Dressing: No Hobbies: No Electronic Signature(s) Signed: 05/24/2019 5:34:10 PM By: Baruch Gouty RN, BSN Entered By: Baruch Gouty on 05/24/2019  11:49:45 -------------------------------------------------------------------------------- Patient/Caregiver Education  Details Patient Name: Date of Service: [name], [name] 3/26/2021andnbsp11:15 AM Medical Record [id number] Patient Account Number: [id number] Date of Birth/Gender: Treating RN: Apr 20, 1934 (85 y.o. [name] Primary Care Physician: [name] Other Clinician: Referring Physician: Treating Physician/Extender:Robson, Vista Lawman, Moses Manners in Treatment: 5 Education Assessment Education Provided To: Patient Education Topics Provided Pain: Methods: Explain/Verbal Responses: State content correctly Wound/Skin Impairment: Methods: Explain/Verbal Responses: State content correctly Electronic Signature(s) Signed: 05/24/2019 5:41:24 PM By: Kela Millin Entered By: Kela Millin on 05/24/2019 11:25:59 -------------------------------------------------------------------------------- Wound Assessment Details Patient Name: Date of Service: [name], [name] 05/24/2019 11:15 AM Medical Record [id number] Patient Account Number: [id number] Date of Birth/Sex: Treating RN: Jun 08, 1934 (85 y.o. [name], [name] Primary Care Clotilda Hafer: [name] Other Clinician: Referring Makenzy Krist: Treating Huntington Leverich/Extender:Robson, Vista Lawman, Moses Manners in Treatment: 5 Wound Status Wound Number: 1 Primary Etiology: Venous Leg Ulcer Wound Location: Right Lower Leg - Lateral Wound Status: Open Wounding Event: Trauma Comorbid History: Cataracts, Confinement Anxiety Date Acquired: 02/27/2019 Weeks Of Treatment: 5 Clustered Wound: No Wound Measurements Length: (cm) 1.9 % Reduc Width: (cm) 2.5 % Reduc Depth: (cm) 0.1 Epithel Area: (cm) 3.731 Tunnel Volume: (cm) 0.373 Underm Wound Description Classification: Full Thickness Without Exposed Support Foul O Structures Slough Wound Flat and Intact Margin: Exudate Small Amount: Exudate  Serosanguineous Type: Exudate red, brown Color: Wound Bed Granulation Amount: Medium (34-66%) Granulation Quality: Red Fascia Exp Necrotic Amount: Medium (34-66%) Fat Layer Necrotic Quality: Adherent Slough Tendon Exp Muscle Exp Joint Expo Bone Expos dor After Cleansing: No /Fibrino Yes Exposed Structure osed: No (Subcutaneous Tissue) Exposed: Yes osed: No osed: No sed: No ed: No tion in Area: -25.7% tion in Volume: -25.6% ialization: Small (1-33%) ing: No ining: No Treatment Notes Wound #1 (Right, Lateral Lower Leg) 1. Cleanse With Wound Cleanser Soap and water 2. Periwound Care Moisturizing lotion 3. Primary Dressing Applied Polymem Ag 4. Secondary Dressing ABD Pad 6. Support Layer Applied 3 layer compression wrap Notes netting. Electronic Signature(s) Signed: 05/24/2019 5:34:10 PM By: Baruch Gouty RN, BSN Entered By: Baruch Gouty on 05/24/2019 11:52:18 -------------------------------------------------------------------------------- Wound Assessment Details Patient Name: Date of Service: [name], CADIENTE 05/24/2019 11:15 AM Medical Record [id number] Patient Account Number: [id number] Date of Birth/Sex: Treating RN: 1935/02/06 (85 y.o. [name], [name] Primary Care Jassmin Kemmerer: [name] Other Clinician: Referring Dianna Deshler: Treating Haru Shaff/Extender:Robson, Vista Lawman, Moses Manners in Treatment: 5 Wound Status Wound Number: 2 Primary Etiology: Venous Leg Ulcer Wound Location: Right Lower Leg - Medial Wound Status: Open Wounding Event: Trauma Comorbid History: Cataracts, Confinement Anxiety Date Acquired: 02/17/2019 Weeks Of Treatment: 5 Clustered Wound: No Wound Measurements Length: (cm) 0.9 % Reduction Width: (cm) 0.6 % Reduction Depth: (cm) 0.1 Epitheliali Area: (cm) 0.424 Tunneling: Volume: (cm) 0.042 Underminin Wound Description Classification: Full Thickness Without Exposed Support Foul O Structures Slough Wound Flat  and Intact Margin: Exudate Small Amount: Exudate Serosanguineous Type: Exudate red, brown Color: Wound Bed Granulation Amount: Large (67-100%) Granulation Quality: Red Fascia Necrotic Amount: None Present (0%) Fat La Tendon Muscle Joint Bone E dor After Cleansing: No /Fibrino No Exposed Structure Exposed: No yer (Subcutaneous Tissue) Exposed: Yes Exposed: No Exposed: No Exposed: No xposed: No in Area: -79.7% in Volume: -75% zation: Small (1-33%) No g: No Treatment Notes Wound #2 (Right, Medial Lower Leg) 1. Cleanse With Wound Cleanser Soap and water 2. Periwound Care Moisturizing lotion 3. Primary Dressing Applied Polymem Ag 4. Secondary Dressing ABD Pad 6. Support Layer Applied 3 layer compression wrap Notes netting. Electronic Signature(s) Signed: 05/24/2019 5:34:10 PM  By: Baruch Gouty RN, BSN Entered By: Baruch Gouty on 05/24/2019 11:52:38 -------------------------------------------------------------------------------- [place] Details Patient Name: Date of Service: JEYLIN, WOODMANSEE 05/24/2019 11:15 AM Medical Record [id number] Patient Account Number: [id number] Date of Birth/Sex: Treating RN: [birth year] (85 y.o. [name] Primary Care Isaura Schiller: [name] Other Clinician: Referring Jonique Kulig: Treating Kynli Chou/Extender:Robson, Vista Lawman, Moses Manners in Treatment: 5 Vital Signs Time Taken: 11:48 Temperature (F): 98.4 Height (in): 64 Pulse (bpm): 81 Source: Stated Respiratory Rate (breaths/min): 20 Weight (lbs): 125 Blood Pressure (mmHg): 127/83 Source: Stated Reference Range: 80 - 120 mg / dl Body Mass Index (BMI): 21.5 Electronic Signature(s) Signed: 05/24/2019 5:34:10 PM By: Baruch Gouty RN, BSN Entered By: Baruch Gouty on 05/24/2019 11:49:08

## 2019-05-25 NOTE — Progress Notes (Signed)
[name], [name] ([id number]) Visit Report for 05/24/2019 HPI Details Patient Name: Date of Service: [name], [name] 05/24/2019 11:15 AM Medical Record [id number] Patient Account Number: [id number] Date of Birth/Sex: Treating RN: Sep 25, 1934 (85 y.o. [name] Primary Care Provider: Lavone Conway Other Clinician: Referring Provider: Treating Provider/Extender:Pihu Basil, [name], [name] in Treatment: 5 History of Present Illness HPI Description: ADMISSION 04/19/2019 This is an 85 year old woman who has chronic interstitial lung disease on prednisone 10 mg a day. Towards the end of December she hit her lateral right leg on a chair. She was seen in urgent care and the area was Steri-Stripped however this did not hold together. Sometime after this traumatized the medial part of her right leg. She has some chronic edema in her lower legs probably is a secondary phenomenon to chronic venous insufficiency. She also takes chronic steroids. He saw her primary physician who is Dr. Lavone Conway at [place] and was given doxycycline on 2/8 I believe she has completed that. She is here for review of nonhealing areas on the right lateral greater than right medial calf Past medical history; includes interstitial lung disease on prednisone 10 mg and is just started Ofev. She has a history of basal cell skin cancer, hyperlipidemia. ABI in our clinic was 1.04 in the right 2/26; patient with chronic venous insufficiency and interstitial lung disease on chronic prednisone. She has a wound on her right lateral and right medial lower leg on the right which were separate incidents of trauma. These wounds started in December. She had very significant debris on both of the wounds last week which required debridement they require debridement again today. We have been using Iodoflex 3/4; the patient has two wounds on her right lower leg one laterally and one medially in the setting of chronic  venous hypertension and resultant separate incidences of trauma. The wounds actually look better in terms of surface. We have been using Iodoflex, changed to [facility] classic today 3/11; she complained of pain almost like lancinating pain in the wound on the right lateral leg. This was new this week and she attributed this to the fact that we changed her to [facility]. Previously everything was well tolerated including at least 2 Conway of Sorbact. She has a $917 per application co-pay for Apligraf. She expressed the fact that it they would be able to pay this if it was felt to be necessary 3/19; patient is complaining about the facility charge coming here. We listened to this for a while. We put polymen on this last week after she experience pain with Hydrofera Blue she seems to tolerate this better. She has an area on the right lateral and right medial leg in the setting of chronic venous insufficiency and chronic prednisone use. Although her overall surface area is not any better the wound surface certainly looks a lot better 3/26; patient has 2 traumatic areas in the setting of chronic venous insufficiency. One is on the right lateral and a separate incidence on the right medial lower extremity. These are not that large and the surface of them looks adequate however very slow to come down in size. Electronic Signature(s) Signed: 05/25/2019 8:02:09 AM By: Terry Ham MD Entered By: [name] on 05/24/2019 13:09:17 -------------------------------------------------------------------------------- Physical Exam Details Patient Name: Date of Service: [name] 05/24/2019 11:15 AM Medical Record [id number] Patient Account Number: [id number] Date of Birth/Sex: Treating RN: [birth year] (85 y.o. [name] Primary Care Provider: Lavone Conway Other Clinician: Referring Provider:  Treating Provider/Extender:[name], [name], [name] in Treatment:  5 Constitutional Sitting or standing Blood Pressure is within target range for patient.. Pulse regular and within target range for patient.[name] Respirations regular, non-labored and within target range.. Temperature is normal and within the target range for the patient.[name] Appears in no distress. Notes Wound exam; the patient has improvement in condition of the wound surface and slightly smaller. We again talked to her about advanced treatment options. She has chronic venous insufficiency and she is on prednisone Electronic Signature(s) Signed: 05/25/2019 8:02:09 AM By: Terry Ham MD Entered By: [name] on 05/24/2019 13:10:02 -------------------------------------------------------------------------------- Physician Orders Details Patient Name: Date of Service: [name], [name] 05/24/2019 11:15 AM Medical Record [id number] Patient Account Number: [id number] Date of Birth/Sex: Treating RN: [birth year] (85 y.o. [name], [name] Primary Care Provider: Lavone Conway Other Clinician: Referring Provider: Treating Provider/Extender:[name], [name], [name] in Treatment: 5 Verbal / Phone Orders: No Diagnosis Coding ICD-10 Coding Code Description I87.311 Chronic venous hypertension (idiopathic) with ulcer of right lower extremity L97.812 Non-pressure chronic ulcer of other part of right lower leg with fat layer exposed S81.811D Laceration without foreign body, right lower leg, subsequent encounter Z92.241 Personal history of systemic steroid therapy Follow-up Appointments Return Appointment in 1 week. - MD Visit Dressing Change Frequency Wound #1 Right,Lateral Lower Leg Do not change entire dressing for one week. Skin Barriers/Peri-Wound Care Moisturizing lotion Wound Cleansing May shower with protection. - use a cast protector. can get at [facility] or [facility] Primary Wound Dressing Wound #1 Right,Lateral Lower Leg Polymem Silver Wound #2 Right,Medial Lower  Leg Polymem Silver Secondary Dressing ABD pad Edema Control 3 Layer Compression System - Right Lower Extremity Avoid standing for long periods of time Elevate legs to the level of the heart or above for 30 minutes daily and/or when sitting, a frequency of: Exercise regularly Electronic Signature(s) Signed: 05/24/2019 5:41:24 PM By: Kela Millin Signed: 05/25/2019 8:02:09 AM By: Terry Ham MD Entered By: Kela Millin on 05/24/2019 12:32:04 -------------------------------------------------------------------------------- Problem List Details Patient Name: Date of Service: [name], [name] 05/24/2019 11:15 AM Medical Record [id number] Patient Account Number: [id number] Date of Birth/Sex: Treating RN: 1934-10-08 (85 y.o. [name] Primary Care Provider: Lavone Conway Other Clinician: Referring Provider: Treating Provider/Extender:Jovanny Stephanie, [name], [name] in Treatment: 5 Active Problems ICD-10 Evaluated Encounter Code Description Active Date Today Diagnosis I87.311 Chronic venous hypertension (idiopathic) with ulcer of 04/19/2019 No Yes right lower extremity L97.812 Non-pressure chronic ulcer of other part of right lower 04/19/2019 No Yes leg with fat layer exposed S81.811D Laceration without foreign body, right lower leg, 04/19/2019 No Yes subsequent encounter Z92.241 Personal history of systemic steroid therapy 04/19/2019 No Yes Inactive Problems Resolved Problems Electronic Signature(s) Signed: 05/25/2019 8:02:09 AM By: Terry Ham MD Entered By: [name] on 05/24/2019 13:08:28 -------------------------------------------------------------------------------- Progress Note Details Patient Name: Date of Service: [name] 05/24/2019 11:15 AM Medical Record [id number] Patient Account Number: [id number] Date of Birth/Sex: Treating RN: [birth year] (85 y.o. [name] Primary Care Provider: Lavone Conway Other  Clinician: Referring Provider: Treating Provider/Extender:Arfa Lamarca, [name], [name] in Treatment: 5 Subjective History of Present Illness (HPI) ADMISSION 04/19/2019 This is an 85 year old woman who has chronic interstitial lung disease on prednisone 10 mg a day. Towards the end of December she hit her lateral right leg on a chair. She was seen in urgent care and the area was Steri-Stripped however this did not hold together. Sometime after this traumatized the medial part of her right leg. She  has some chronic edema in her lower legs probably is a secondary phenomenon to chronic venous insufficiency. She also takes chronic steroids. He saw her primary physician who is Dr. Lavone Conway at [place] and was given doxycycline on 2/8 I believe she has completed that. She is here for review of nonhealing areas on the right lateral greater than right medial calf Past medical history; includes interstitial lung disease on prednisone 10 mg and is just started Ofev. She has a history of basal cell skin cancer, hyperlipidemia. ABI in our clinic was 1.04 in the right 2/26; patient with chronic venous insufficiency and interstitial lung disease on chronic prednisone. She has a wound on her right lateral and right medial lower leg on the right which were separate incidents of trauma. These wounds started in December. She had very significant debris on both of the wounds last week which required debridement they require debridement again today. We have been using Iodoflex 3/4; the patient has two wounds on her right lower leg one laterally and one medially in the setting of chronic venous hypertension and resultant separate incidences of trauma. The wounds actually look better in terms of surface. We have been using Iodoflex, changed to [facility] classic today 3/11; she complained of pain almost like lancinating pain in the wound on the right lateral leg. This was new this week and she  attributed this to the fact that we changed her to [facility]. Previously everything was well tolerated including at least 2 Conway of Sorbact. She has a $725 per application co-pay for Apligraf. She expressed the fact that it they would be able to pay this if it was felt to be necessary 3/19; patient is complaining about the facility charge coming here. We listened to this for a while. We put polymen on this last week after she experience pain with Hydrofera Blue she seems to tolerate this better. She has an area on the right lateral and right medial leg in the setting of chronic venous insufficiency and chronic prednisone use. Although her overall surface area is not any better the wound surface certainly looks a lot better 3/26; patient has 2 traumatic areas in the setting of chronic venous insufficiency. One is on the right lateral and a separate incidence on the right medial lower extremity. These are not that large and the surface of them looks adequate however very slow to come down in size. Objective Constitutional Sitting or standing Blood Pressure is within target range for patient.. Pulse regular and within target range for patient.[name] Respirations regular, non-labored and within target range.. Temperature is normal and within the target range for the patient.[name] Appears in no distress. Vitals Time Taken: 11:48 AM, Height: 64 in, Source: Stated, Weight: 125 lbs, Source: Stated, BMI: 21.5, Temperature: 98.4 F, Pulse: 81 bpm, Respiratory Rate: 20 breaths/min, Blood Pressure: 127/83 mmHg. General Notes: Wound exam; the patient has improvement in condition of the wound surface and slightly smaller. We again talked to her about advanced treatment options. She has chronic venous insufficiency and she is on prednisone Integumentary (Hair, Skin) Wound #1 status is Open. Original cause of wound was Trauma. The wound is located on the Right,Lateral Lower Leg. The wound measures 1.9cm length  x 2.5cm width x 0.1cm depth; 3.731cm^2 area and 0.373cm^3 volume. There is Fat Layer (Subcutaneous Tissue) Exposed exposed. There is no tunneling or undermining noted. There is a small amount of serosanguineous drainage noted. The wound margin is flat and intact. There is medium (  34-66%) red granulation within the wound bed. There is a medium (34-66%) amount of necrotic tissue within the wound bed including Adherent Slough. Wound #2 status is Open. Original cause of wound was Trauma. The wound is located on the Right,Medial Lower Leg. The wound measures 0.9cm length x 0.6cm width x 0.1cm depth; 0.424cm^2 area and 0.042cm^3 volume. There is Fat Layer (Subcutaneous Tissue) Exposed exposed. There is no tunneling or undermining noted. There is a small amount of serosanguineous drainage noted. The wound margin is flat and intact. There is large (67-100%) red granulation within the wound bed. There is no necrotic tissue within the wound bed. Assessment Active Problems ICD-10 Chronic venous hypertension (idiopathic) with ulcer of right lower extremity Non-pressure chronic ulcer of other part of right lower leg with fat layer exposed Laceration without foreign body, right lower leg, subsequent encounter Personal history of systemic steroid therapy Procedures Wound #1 Pre-procedure diagnosis of Wound #1 is a Venous Leg Ulcer located on the Right,Lateral Lower Leg . There was a Three Layer Compression Therapy Procedure by Deon Pilling, RN. Post procedure Diagnosis Wound #1: Same as Pre-Procedure Wound #2 Pre-procedure diagnosis of Wound #2 is a Venous Leg Ulcer located on the Right,Medial Lower Leg . There was a Three Layer Compression Therapy Procedure by Deon Pilling, RN. Post procedure Diagnosis Wound #2: Same as Pre-Procedure Plan Follow-up Appointments: Return Appointment in 1 week. - MD Visit Dressing Change Frequency: Wound #1 Right,Lateral Lower Leg: Do not change entire dressing for  one week. Skin Barriers/Peri-Wound Care: Moisturizing lotion Wound Cleansing: May shower with protection. - use a cast protector. can get at [facility] Primary Wound Dressing: Wound #1 Right,Lateral Lower Leg: Polymem Silver Wound #2 Right,Medial Lower Leg: Polymem Silver Secondary Dressing: ABD pad Edema Control: 3 Layer Compression System - Right Lower Extremity Avoid standing for long periods of time Elevate legs to the level of the heart or above for 30 minutes daily and/or when sitting, a frequency of: Exercise regularly 1. Traumatic wounds in the setting of chronic venous insufficiency and chronic prednisone use 2. There is depth of these although the base of both wounds looks healthy the surface area is only slightly better 3. We have already run for approval of Apligraf the patient has agreed to the $200 co-pay we will move ahead with this, if this is available by next week we will apply for the first time at that point. Electronic Signature(s) Signed: 05/25/2019 8:02:09 AM By: Terry Ham MD Entered By: [name] on 05/24/2019 13:10:55 -------------------------------------------------------------------------------- SuperBill Details Patient Name: Date of Service: [name], [name] 05/24/2019 Medical Record [id number] Patient Account Number: [id number] Date of Birth/Sex: Treating RN: 1935/02/03 (85 y.o. [name], [name] Primary Care Provider: Lavone Conway Other Clinician: Referring Provider: Treating Provider/Extender:Lary Eckardt, [name], [name] in Treatment: 5 Diagnosis Coding ICD-10 Codes Code Description I87.311 Chronic venous hypertension (idiopathic) with ulcer of right lower extremity L97.812 Non-pressure chronic ulcer of other part of right lower leg with fat layer exposed S81.811D Laceration without foreign body, right lower leg, subsequent encounter Z92.241 Personal history of systemic steroid therapy Facility Procedures CPT4  Code Description: [id number] (Facility Use Only) [phone] - APPLY MULTLAY COMPRS LWR RT LEG Modifier: Quantity: 1 Physician Procedures CPT4 Code Description: [id number] - WC PHYS LEVEL 3 - EST PT ICD-10 Diagnosis Description I87.311 Chronic venous hypertension (idiopathic) with ulcer of r L97.812 Non-pressure chronic ulcer of other part of right lower S81.811D Laceration without  foreign body, right lower leg, subseq Z92.241 Personal  history of systemic steroid therapy Modifier: ight lower extr leg with fat la uent encounter Quantity: 1 emity yer exposed Electronic Signature(s) Signed: 05/25/2019 8:02:09 AM By: Terry Ham MD Entered By: [name] on 05/24/2019 13:11:16

## 2019-05-31 ENCOUNTER — Encounter (HOSPITAL_BASED_OUTPATIENT_CLINIC_OR_DEPARTMENT_OTHER): Payer: Medicare HMO | Attending: Physician Assistant | Admitting: Physician Assistant

## 2019-05-31 ENCOUNTER — Other Ambulatory Visit: Payer: Self-pay

## 2019-05-31 DIAGNOSIS — Z85828 Personal history of other malignant neoplasm of skin: Secondary | ICD-10-CM | POA: Diagnosis not present

## 2019-05-31 DIAGNOSIS — J849 Interstitial pulmonary disease, unspecified: Secondary | ICD-10-CM | POA: Insufficient documentation

## 2019-05-31 DIAGNOSIS — L97812 Non-pressure chronic ulcer of other part of right lower leg with fat layer exposed: Secondary | ICD-10-CM | POA: Insufficient documentation

## 2019-05-31 DIAGNOSIS — I87311 Chronic venous hypertension (idiopathic) with ulcer of right lower extremity: Secondary | ICD-10-CM | POA: Diagnosis not present

## 2019-05-31 DIAGNOSIS — I872 Venous insufficiency (chronic) (peripheral): Secondary | ICD-10-CM | POA: Diagnosis not present

## 2019-05-31 DIAGNOSIS — W228XXD Striking against or struck by other objects, subsequent encounter: Secondary | ICD-10-CM | POA: Insufficient documentation

## 2019-05-31 DIAGNOSIS — Z7952 Long term (current) use of systemic steroids: Secondary | ICD-10-CM | POA: Insufficient documentation

## 2019-05-31 DIAGNOSIS — E785 Hyperlipidemia, unspecified: Secondary | ICD-10-CM | POA: Diagnosis not present

## 2019-05-31 DIAGNOSIS — S81811D Laceration without foreign body, right lower leg, subsequent encounter: Secondary | ICD-10-CM | POA: Insufficient documentation

## 2019-05-31 DIAGNOSIS — L97919 Non-pressure chronic ulcer of unspecified part of right lower leg with unspecified severity: Secondary | ICD-10-CM | POA: Diagnosis present

## 2019-05-31 NOTE — Progress Notes (Addendum)
[name] ([id number]) Visit Report for 05/31/2019 Chief Complaint Document Details Patient Name: Date of Service: [name], [name] 05/31/2019 1:45 PM Medical Record [id number] Patient Account Number: [id number] Date of Birth/Sex: Treating RN: 1934/08/28 (85 y.o. F) Primary Care Provider: Lavone Conway Other Clinician: Referring Provider: Treating Provider/Extender:[name], [name] in Treatment: 6 Information Obtained from: Patient Chief Complaint 04/19/2019; patient is here for review of a laceration injury x2 on her right lower extremity Electronic Signature(s) Signed: 05/31/2019 1:45:24 PM By: Terry Keeler PA-C Entered By: [name] on 05/31/2019 13:45:24 -------------------------------------------------------------------------------- Cellular or Tissue Based Product Details Patient Name: Date of Service: [name], [name] 05/31/2019 1:45 PM Medical Record [id number] Patient Account Number: [id number] Date of Birth/Sex: Treating RN: October 05, 1934 (85 y.o. [name] Primary Care Provider: Lavone Conway Other Clinician: Referring Provider: Treating Provider/Extender:[name], [name] in Treatment: 6 Cellular or Tissue Based Wound #1 Right,Lateral Lower Leg Product Type Applied to: Performed By: Physician Terry Keeler, PA Cellular or Tissue Based Apligraf Product Type: Level of Consciousness (Pre- Awake and Alert procedure): Pre-procedure Yes - 14:46 Verification/Time Out Taken: Location: trunk / arms / legs Wound Size (sq cm): 2.24 Product Size (sq cm): 22 Waste Size (sq cm): 0 Amount of Product Applied (sq cm): 22 Instrument Used: Blade, Forceps, Scissors Lot #: [id number] Order #: 1 Expiration Date: 06/07/2019 Fenestrated: Yes Instrument: Blade Reconstituted: Yes Solution Type: normal saline Solution Amount: 83m Lot #:: [id number] Expiration Date: 10/28/2020 Secured: Yes Secured With:  Steri-Strips Dressing Applied: Yes Primary Dressing: adaptic Procedural Pain: 0 Post Procedural Pain: 0 Response to Treatment: Procedure was tolerated well Level of Consciousness Awake and Alert (Post-procedure): Post Procedure Diagnosis Same as Pre-procedure Electronic Signature(s) Signed: 05/31/2019 4:46:14 PM By: Terry KeelerPA-C Signed: 05/31/2019 5:28:43 PM By: Terry HurstRN, BSN Entered By: Terry Hurston 05/31/2019 15:27:21 -------------------------------------------------------------------------------- Cellular or Tissue Based Product Details Patient Name: Date of Service: [name], ROLLYSON4/04/2019 1:45 PM Medical Record [id number] Account Number: [id number] of Birth/Sex: Treating RN: 404/06/1934(85y.o. FNancy FetterPrimary Care Provider: GLavone OrnOther Clinician: Referring Provider: Treating Provider/Extender:Terry III, HJolaine Conway JMoses Mannersin Treatment: 6 Cellular or Tissue Based Wound #2 Right,Medial Lower Leg Product Type Applied to: Performed By: Physician Terry Keeler PA Cellular or Tissue Based Apligraf Product Type: Level of Consciousness (Pre- Awake and Alert procedure): Pre-procedure Yes - 14:46 Verification/Time Out Taken: Location: trunk / arms / legs Wound Size (sq cm): 0.24 Product Size (sq cm): 22 Waste Size (sq cm): 0 Amount of Product Applied (sq cm): 22 Instrument Used: Blade, Forceps, Scissors Lot #: [id number] Order #: 1 Expiration Date: 06/07/2019 Fenestrated: Yes Instrument: Blade Reconstituted: Yes Solution Type: normal saline Solution Amount: 173mLot #: : [id number] Expiration Date: 10/28/2020 Secured: Yes Secured With: Steri-Strips Dressing Applied: Yes Primary Dressing: adaptic Procedural Pain: 0 Post Procedural Pain: 0 Response to Treatment: Procedure was tolerated well Level of Consciousness Awake and Alert (Post-procedure): Post Procedure Diagnosis Same as  Pre-procedure Electronic Signature(s) Signed: 05/31/2019 4:46:14 PM By: StWorthy KeelerA-C Signed: 05/31/2019 5:28:43 PM By: LyLevan HurstN, BSN Entered By: LyLevan Hurstn 05/31/2019 15:27:34 -------------------------------------------------------------------------------- Debridement Details Patient Name: Date of Service: [name]/04/2019 1:45 PM Medical Record [id number] Account Number: [id number] of Birth/Sex: Treating RN: [birth year].o. F)Terry Sorrelrimary Care Provider: GrLavone Ornther Clinician: Referring Provider: Treating Provider/Extender:Terry III, HoJolaine ClickJoMoses Mannersn Treatment: 6  Debridement Performed for Wound #1 Right,Lateral Lower Leg Assessment: Performed By: Physician Terry Keeler, PA Debridement Type: Debridement Severity of Tissue Pre Fat layer exposed Debridement: Level of Consciousness (Pre- Awake and Alert procedure): Pre-procedure Verification/Time Out Taken: Yes - 14:45 Start Time: 14:45 Pain Control: Other : benzocaine, 20% Total Area Debrided (L x W): 1.6 (cm) x 1.4 (cm) = 2.24 (cm) Tissue and other material Viable, Non-Viable, Slough, Subcutaneous, Slough debrided: Level: Skin/Subcutaneous Tissue Debridement Description: Excisional Instrument: Curette Bleeding: Minimum Hemostasis Achieved: Pressure End Time: 14:46 Procedural Pain: 0 Post Procedural Pain: 0 Response to Treatment: Procedure was tolerated well Level of Consciousness Awake and Alert (Post-procedure): Post Debridement Measurements of Total Wound Length: (cm) 1.6 Width: (cm) 1.4 Depth: (cm) 0.1 Volume: (cm) 0.176 Character of Wound/Ulcer Post Improved Debridement: Severity of Tissue Post Debridement: Fat layer exposed Post Procedure Diagnosis Same as Pre-procedure Electronic Signature(s) Signed: 05/31/2019 4:46:14 PM By: Terry Keeler PA-C Signed: 06/03/2019 4:55:48 PM By: [name] Entered By: [name] on 05/31/2019 14:50:01 -------------------------------------------------------------------------------- Debridement Details Patient Name: Date of Service: [name] 05/31/2019 1:45 PM Medical Record [id number] Patient Account Number: [id number] Date of Birth/Sex: Treating RN: Apr 08, 1934 (85 y.o. [name] Primary Care Provider: Lavone Conway Other Clinician: Referring Provider: Treating Provider/Extender:[name], [name] in Treatment: 6 Debridement Performed for Wound #2 Right,Medial Lower Leg Assessment: Performed By: Physician Terry Keeler, PA Debridement Type: Debridement Severity of Tissue Pre Fat layer exposed Debridement: Level of Consciousness (Pre- Awake and Alert procedure): Pre-procedure Verification/Time Out Taken: Yes - 14:45 Start Time: 14:45 Pain Control: Other : benzocaine, 20% Total Area Debrided (L x W): 0.6 (cm) x 0.4 (cm) = 0.24 (cm) Tissue and other material Viable, Non-Viable, Slough, Subcutaneous, Slough debrided: Level: Skin/Subcutaneous Tissue Debridement Description: Excisional Instrument: Curette Bleeding: Minimum Hemostasis Achieved: Pressure End Time: 14:46 Procedural Pain: 0 Post Procedural Pain: 0 Response to Treatment: Procedure was tolerated well Level of Consciousness Awake and Alert (Post-procedure): Post Debridement Measurements of Total Wound Length: (cm) 0.6 Width: (cm) 0.4 Depth: (cm) 0.1 Volume: (cm) 0.019 Character of Wound/Ulcer Post Improved Debridement: Severity of Tissue Post Debridement: Fat layer exposed Post Procedure Diagnosis Same as Pre-procedure Electronic Signature(s) Signed: 05/31/2019 4:46:14 PM By: Terry Keeler PA-C Signed: 06/03/2019 4:55:48 PM By: [name] Entered By: [name] on 05/31/2019 14:50:46 -------------------------------------------------------------------------------- HPI Details Patient Name: Date of Service: [name] 05/31/2019 1:45 PM Medical Record [id number] Patient Account Number: [id number] Date of Birth/Sex: Treating RN: [birth year] (85 y.o. F) Primary Care Provider: Lavone Conway Other Clinician: Referring Provider: Treating Provider/Extender:[name], [name] in Treatment: 6 History of Present Illness HPI Description: ADMISSION 04/19/2019 This is an 85 year old woman who has chronic interstitial lung disease on prednisone 10 mg a day. Towards the end of December she hit her lateral right leg on a chair. She was seen in urgent care and the area was Steri-Stripped however this did not hold together. Sometime after this traumatized the medial part of her right leg. She has some chronic edema in her lower legs probably is a secondary phenomenon to chronic venous insufficiency. She also takes chronic steroids. He saw her primary physician who is Dr. Lavone Conway at [facility] and was given doxycycline on 2/8 I believe she has completed that. She is here for review of nonhealing areas on the right lateral greater than right medial calf Past medical history; includes interstitial lung disease on prednisone 10 mg and is just started Ofev. She  has a history of basal cell skin cancer, hyperlipidemia. ABI in our clinic was 1.04 in the right 2/26; patient with chronic venous insufficiency and interstitial lung disease on chronic prednisone. She has a wound on her right lateral and right medial lower leg on the right which were separate incidents of trauma. These wounds started in December. She had very significant debris on both of the wounds last week which required debridement they require debridement again today. We have been using Iodoflex 3/4; the patient has two wounds on her right lower leg one laterally and one medially in the setting of chronic venous hypertension and resultant separate incidences of trauma. The wounds actually look better in terms of surface. We have been  using Iodoflex, changed to [facility] classic today 3/11; she complained of pain almost like lancinating pain in the wound on the right lateral leg. This was new this week and she attributed this to the fact that we changed her to [facility]. Previously everything was well tolerated including at least 2 weeks of Sorbact. She has a $606 per application co-pay for Apligraf. She expressed the fact that it they would be able to pay this if it was felt to be necessary 3/19; patient is complaining about the facility charge coming here. We listened to this for a while. We put polymen on this last week after she experience pain with Hydrofera Blue she seems to tolerate this better. She has an area on the right lateral and right medial leg in the setting of chronic venous insufficiency and chronic prednisone use. Although her overall surface area is not any better the wound surface certainly looks a lot better 3/26; patient has 2 traumatic areas in the setting of chronic venous insufficiency. One is on the right lateral and a separate incidence on the right medial lower extremity. These are not that large and the surface of them looks adequate however very slow to come down in size. 05/31/2019 upon evaluation today patient appears to be doing decently well in regard to her wounds today. Both are measuring slightly smaller which is good news. Fortunately there is no signs of active infection at this time. Subsequently I do believe that she is a candidate for proceeding with the Apligraf at this point we did get approval and she does want to proceed as such. Fortunately there is no signs of active infection at this time. Electronic Signature(s) Signed: 05/31/2019 3:07:23 PM By: Terry Keeler PA-C Entered By: [name] on 05/31/2019 15:07:23 -------------------------------------------------------------------------------- Physical Exam Details Patient Name: Date of Service: [name], [name]  05/31/2019 1:45 PM Medical Record [id number] Patient Account Number: [id number] Date of Birth/Sex: Treating RN: December 01, 1934 (85 y.o. F) Primary Care Provider: Lavone Conway Other Clinician: Referring Provider: Treating Provider/Extender:[name], [name] in Treatment: 6 Constitutional Well-nourished and well-hydrated in no acute distress. Respiratory normal breathing without difficulty. Psychiatric this patient is able to make decisions and demonstrates good insight into disease process. Alert and Oriented x 3. pleasant and cooperative. Notes Patient's wound bed currently did require sharp debridement of both wound locations in order to clear away some of the necrotic debris. She tolerated this today without complication post debridement wound bed appears to be doing much better which is great news. Post debridement once the wounds were nice and clean I did proceed with application of the Apligraf which was applied to both wound locations. She tolerated this today without complication of secured with Steri-Strips and Adaptic. The 3  layer compression wrap was then applied over top. Electronic Signature(s) Signed: 05/31/2019 3:08:53 PM By: Terry Keeler PA-C Previous Signature: 05/31/2019 3:07:43 PM Version By: Terry Keeler PA-C Entered By: [name] on 05/31/2019 15:08:52 -------------------------------------------------------------------------------- Physician Orders Details Patient Name: Date of Service: [name], [name] 05/31/2019 1:45 PM Medical Record [id number] Patient Account Number: [id number] Date of Birth/Sex: Treating RN: 30-Sep-1934 (85 y.o. [name], [name] Primary Care Provider: Lavone Conway Other Clinician: Referring Provider: Treating Provider/Extender:[name], [name] in Treatment: 6 Verbal / Phone Orders: No Diagnosis Coding ICD-10 Coding Code Description I87.311 Chronic venous hypertension (idiopathic)  with ulcer of right lower extremity L97.812 Non-pressure chronic ulcer of other part of right lower leg with fat layer exposed S81.811D Laceration without foreign body, right lower leg, subsequent encounter Z92.241 Personal history of systemic steroid therapy Follow-up Appointments Return Appointment in 2 weeks. - MD visit Nurse Visit: - Next week Dressing Change Frequency Wound #1 Right,Lateral Lower Leg Do not change entire dressing for one week. Skin Barriers/Peri-Wound Care Moisturizing lotion Wound Cleansing May shower with protection. - use a cast protector. can get at [facility] or [facility] Primary Wound Dressing Wound #1 Right,Lateral Lower Leg Skin Substitute Application - Apligraft #1 Wound #2 Right,Medial Lower Leg Skin Substitute Application - Apligraft #1 Secondary Dressing Wound #1 Right,Lateral Lower Leg ABD pad Other: - adaptic and steri-strips Wound #2 Right,Medial Lower Leg ABD pad Other: - adaptic and steri-strips Edema Control 3 Layer Compression System - Right Lower Extremity Avoid standing for long periods of time Elevate legs to the level of the heart or above for 30 minutes daily and/or when sitting, a frequency of: Exercise regularly Electronic Signature(s) Signed: 05/31/2019 4:46:14 PM By: Terry Keeler PA-C Signed: 06/03/2019 4:55:48 PM By: [name] Entered By: [name] on 05/31/2019 14:58:58 -------------------------------------------------------------------------------- Problem List Details Patient Name: Date of Service: [name] 05/31/2019 1:45 PM Medical Record [id number] Patient Account Number: [id number] Date of Birth/Sex: Treating RN: [birth year] (85 y.o. F) Primary Care Provider: Lavone Conway Other Clinician: Referring Provider: Treating Provider/Extender:[name], [name] in Treatment: 6 Active Problems ICD-10 Evaluated Encounter Code Description Active Date Today Diagnosis I87.311 Chronic  venous hypertension (idiopathic) with ulcer of 04/19/2019 No Yes right lower extremity L97.812 Non-pressure chronic ulcer of other part of right lower 04/19/2019 No Yes leg with fat layer exposed S81.811D Laceration without foreign body, right lower leg, 04/19/2019 No Yes subsequent encounter Z92.241 Personal history of systemic steroid therapy 04/19/2019 No Yes Inactive Problems Resolved Problems Electronic Signature(s) Signed: 05/31/2019 4:46:14 PM By: Terry Keeler PA-C Signed: 06/03/2019 4:55:48 PM By: [name] Previous Signature: 05/31/2019 1:45:19 PM Version By: Terry Keeler PA-C Entered By: [name] on 05/31/2019 14:48:20 -------------------------------------------------------------------------------- Progress Note Details Patient Name: Date of Service: [name] 05/31/2019 1:45 PM Medical Record [id number] Patient Account Number: [id number] Date of Birth/Sex: Treating RN: [birth year] (85 y.o. F) Primary Care Provider: Lavone Conway Other Clinician: Referring Provider: Treating Provider/Extender:[name], [name] in Treatment: 6 Subjective Chief Complaint Information obtained from Patient 04/19/2019; patient is here for review of a laceration injury x2 on her right lower extremity History of Present Illness (HPI) ADMISSION 04/19/2019 This is an 85 year old woman who has chronic interstitial lung disease on prednisone 10 mg a day. Towards the end of December she hit her lateral right leg on a chair. She was seen in urgent care and the area was Steri-Stripped however this did not hold together. Sometime after  this traumatized the medial part of her right leg. She has some chronic edema in her lower legs probably is a secondary phenomenon to chronic venous insufficiency. She also takes chronic steroids. He saw her primary physician who is Dr. Lavone Conway at [place] and was given doxycycline on 2/8 I believe she has completed that. She is  here for review of nonhealing areas on the right lateral greater than right medial calf Past medical history; includes interstitial lung disease on prednisone 10 mg and is just started Ofev. She has a history of basal cell skin cancer, hyperlipidemia. ABI in our clinic was 1.04 in the right 2/26; patient with chronic venous insufficiency and interstitial lung disease on chronic prednisone. She has a wound on her right lateral and right medial lower leg on the right which were separate incidents of trauma. These wounds started in December. She had very significant debris on both of the wounds last week which required debridement they require debridement again today. We have been using Iodoflex 3/4; the patient has two wounds on her right lower leg one laterally and one medially in the setting of chronic venous hypertension and resultant separate incidences of trauma. The wounds actually look better in terms of surface. We have been using Iodoflex, changed to [facility] classic today 3/11; she complained of pain almost like lancinating pain in the wound on the right lateral leg. This was new this week and she attributed this to the fact that we changed her to [facility]. Previously everything was well tolerated including at least 2 weeks of Sorbact. She has a $202 per application co-pay for Apligraf. She expressed the fact that it they would be able to pay this if it was felt to be necessary 3/19; patient is complaining about the facility charge coming here. We listened to this for a while. We put polymen on this last week after she experience pain with Hydrofera Blue she seems to tolerate this better. She has an area on the right lateral and right medial leg in the setting of chronic venous insufficiency and chronic prednisone use. Although her overall surface area is not any better the wound surface certainly looks a lot better 3/26; patient has 2 traumatic areas in the setting of  chronic venous insufficiency. One is on the right lateral and a separate incidence on the right medial lower extremity. These are not that large and the surface of them looks adequate however very slow to come down in size. 05/31/2019 upon evaluation today patient appears to be doing decently well in regard to her wounds today. Both are measuring slightly smaller which is good news. Fortunately there is no signs of active infection at this time. Subsequently I do believe that she is a candidate for proceeding with the Apligraf at this point we did get approval and she does want to proceed as such. Fortunately there is no signs of active infection at this time. Objective Constitutional Well-nourished and well-hydrated in no acute distress. Vitals Time Taken: 1:51 PM, Height: 64 in, Weight: 125 lbs, BMI: 21.5, Temperature: 97.1 F, Pulse: 90 bpm, Respiratory Rate: 22 breaths/min, Blood Pressure: 134/87 mmHg. Respiratory normal breathing without difficulty. Psychiatric this patient is able to make decisions and demonstrates good insight into disease process. Alert and Oriented x 3. pleasant and cooperative. General Notes: Patient's wound bed currently did require sharp debridement of both wound locations in order to clear away some of the necrotic debris. She tolerated this today without complication post debridement  wound bed appears to be doing much better which is great news. Post debridement once the wounds were nice and clean I did proceed with application of the Apligraf which was applied to both wound locations. She tolerated this today without complication of secured with Steri-Strips and Adaptic. The 3 layer compression wrap was then applied over top. Integumentary (Hair, Skin) Wound #1 status is Open. Original cause of wound was Trauma. The wound is located on the Right,Lateral Lower Leg. The wound measures 1.6cm length x 1.4cm width x 0.1cm depth; 1.759cm^2 area and 0.176cm^3  volume. There is Fat Layer (Subcutaneous Tissue) Exposed exposed. There is no tunneling or undermining noted. There is a medium amount of serosanguineous drainage noted. The wound margin is flat and intact. There is medium (34-66%) red granulation within the wound bed. There is a medium (34-66%) amount of necrotic tissue within the wound bed including Adherent Slough. Wound #2 status is Open. Original cause of wound was Trauma. The wound is located on the Right,Medial Lower Leg. The wound measures 0.6cm length x 0.4cm width x 0.1cm depth; 0.188cm^2 area and 0.019cm^3 volume. There is Fat Layer (Subcutaneous Tissue) Exposed exposed. There is no tunneling or undermining noted. There is a medium amount of serosanguineous drainage noted. The wound margin is flat and intact. There is large (67-100%) red granulation within the wound bed. There is no necrotic tissue within the wound bed. Assessment Active Problems ICD-10 Chronic venous hypertension (idiopathic) with ulcer of right lower extremity Non-pressure chronic ulcer of other part of right lower leg with fat layer exposed Laceration without foreign body, right lower leg, subsequent encounter Personal history of systemic steroid therapy Procedures Wound #1 Pre-procedure diagnosis of Wound #1 is a Venous Leg Ulcer located on the Right,Lateral Lower Leg .Severity of Tissue Pre Debridement is: Fat layer exposed. There was a Excisional Skin/Subcutaneous Tissue Debridement with a total area of 2.24 sq cm performed by Terry Keeler, PA. With the following instrument(s): Curette to remove Viable and Non-Viable tissue/material. Material removed includes Subcutaneous Tissue and Slough and after achieving pain control using Other (benzocaine, 20%). No specimens were taken. A time out was conducted at 14:45, prior to the start of the procedure. A Minimum amount of bleeding was controlled with Pressure. The procedure was tolerated well with a pain level  of 0 throughout and a pain level of 0 following the procedure. Post Debridement Measurements: 1.6cm length x 1.4cm width x 0.1cm depth; 0.176cm^3 volume. Character of Wound/Ulcer Post Debridement is improved. Severity of Tissue Post Debridement is: Fat layer exposed. Post procedure Diagnosis Wound #1: Same as Pre-Procedure Pre-procedure diagnosis of Wound #1 is a Venous Leg Ulcer located on the Right,Lateral Lower Leg. A skin graft procedure using a bioengineered skin substitute/cellular or tissue based product was performed by Terry Keeler, PA with the following instrument(s): Blade, Forceps, and Scissors. Apligraf was applied and secured with Steri- Strips. 22 sq cm of product was utilized and 0 sq cm was wasted. Post Application, adaptic was applied. A Time Out was conducted at 14:46, prior to the start of the procedure. The procedure was tolerated well with a pain level of 0 throughout and a pain level of 0 following the procedure. Post procedure Diagnosis Wound #1: Same as Pre-Procedure . Pre-procedure diagnosis of Wound #1 is a Venous Leg Ulcer located on the Right,Lateral Lower Leg . There was a Three Layer Compression Therapy Procedure by Terry Pilling, RN. Post procedure Diagnosis Wound #1: Same as Pre-Procedure Wound #2 Pre-procedure  diagnosis of Wound #2 is a Venous Leg Ulcer located on the Right,Medial Lower Leg .Severity of Tissue Pre Debridement is: Fat layer exposed. There was a Excisional Skin/Subcutaneous Tissue Debridement with a total area of 0.24 sq cm performed by Terry Keeler, PA. With the following instrument(s): Curette to remove Viable and Non-Viable tissue/material. Material removed includes Subcutaneous Tissue and Slough and after achieving pain control using Other (benzocaine, 20%). No specimens were taken. A time out was conducted at 14:45, prior to the start of the procedure. A Minimum amount of bleeding was controlled with Pressure. The procedure was  tolerated well with a pain level of 0 throughout and a pain level of 0 following the procedure. Post Debridement Measurements: 0.6cm length x 0.4cm width x 0.1cm depth; 0.019cm^3 volume. Character of Wound/Ulcer Post Debridement is improved. Severity of Tissue Post Debridement is: Fat layer exposed. Post procedure Diagnosis Wound #2: Same as Pre-Procedure Pre-procedure diagnosis of Wound #2 is a Venous Leg Ulcer located on the Right,Medial Lower Leg. A skin graft procedure using a bioengineered skin substitute/cellular or tissue based product was performed by Terry Keeler, PA with the following instrument(s): Blade, Forceps, and Scissors. Apligraf was applied and secured with Steri- Strips. 22 sq cm of product was utilized and 0 sq cm was wasted. Post Application, adaptic was applied. A Time Out was conducted at 14:46, prior to the start of the procedure. The procedure was tolerated well with a pain level of 0 throughout and a pain level of 0 following the procedure. Post procedure Diagnosis Wound #2: Same as Pre-Procedure . Plan Follow-up Appointments: Return Appointment in 2 weeks. - MD visit Nurse Visit: - Next week Dressing Change Frequency: Wound #1 Right,Lateral Lower Leg: Do not change entire dressing for one week. Skin Barriers/Peri-Wound Care: Moisturizing lotion Wound Cleansing: May shower with protection. - use a cast protector. can get at [facility] or [facility] Primary Wound Dressing: Wound #1 Right,Lateral Lower Leg: Skin Substitute Application - Apligraft #1 Wound #2 Right,Medial Lower Leg: Skin Substitute Application - Apligraft #1 Secondary Dressing: Wound #1 Right,Lateral Lower Leg: ABD pad Other: - adaptic and steri-strips Wound #2 Right,Medial Lower Leg: ABD pad Other: - adaptic and steri-strips Edema Control: 3 Layer Compression System - Right Lower Extremity Avoid standing for long periods of time Elevate legs to the level of the heart or above for 30 minutes  daily and/or when sitting, a frequency of: Exercise regularly 1. My suggestion at this point is can be that we go ahead and continue with the Apligraf today. We did initiate treatment and this is application #1. I explained to the patient that she will need to come in for wrap changes next week although the Apligraf will stay in place for 2 weeks. 2. I am also can recommend that we continue with the 3 layer compression wrap which does seem to be beneficial for. 3. I am also can recommend that she continue to elevate her leg is much as possible to avoid any significant edema. We will see patient back for reevaluation in 1 week here in the clinic. If anything worsens or changes patient will contact our office for additional recommendations. Electronic Signature(s) Signed: 05/31/2019 4:46:14 PM By: Terry Keeler PA-C Signed: 05/31/2019 5:28:43 PM By: Terry Hurst RN, BSN Previous Signature: 05/31/2019 3:09:17 PM Version By: Terry Keeler PA-C Previous Signature: 05/31/2019 3:08:32 PM Version By: Terry Keeler PA-C Entered By: [name] on 05/31/2019 15:28:44 -------------------------------------------------------------------------------- SuperBill Details Patient Name: Date of Service:  Allene, Furuya Novi P. 05/31/2019 Medical Record [id number] Patient Account Number: [id number] Date of Birth/Sex: Treating RN: 05/10/1934 (85 y.o. F) Primary Care Provider: Lavone Conway Other Clinician: Referring Provider: Treating Provider/Extender:[name], [name] in Treatment: 6 Diagnosis Coding ICD-10 Codes Code Description I87.311 Chronic venous hypertension (idiopathic) with ulcer of right lower extremity L97.812 Non-pressure chronic ulcer of other part of right lower leg with fat layer exposed S81.811D Laceration without foreign body, right lower leg, subsequent encounter Z92.241 Personal history of systemic steroid therapy Facility Procedures CPT4 Code Description:  06840335 (Facility Use Only) Apligraf 1 SQ CM Modifier: Quantity: 83 CPT4 Code Description: 33174099 [id number] - SKIN SUB GRAFT TRNK/ARM/LEG ICD-10 Diagnosis Description I87.311 Chronic venous hypertension (idiopathic) with ulcer of right L97.812 Non-pressure chronic ulcer of other part of right lower leg w Modifier: lower extrem ith fat laye Quantity: 1 ity r exposed Physician Procedures CPT4 Code Description: 2780044 [id number] - WC PHYS SKIN SUB GRAFT TRNK/ARM/LEG ICD-10 Diagnosis Description I87.311 Chronic venous hypertension (idiopathic) with ulcer of right l L97.812 Non-pressure chronic ulcer of other part of right lower leg wi Modifier: ower extrem th fat laye Quantity: 1 ity r exposed Electronic Signature(s) Signed: 05/31/2019 4:46:14 PM By: Terry Keeler PA-C Signed: 05/31/2019 5:28:43 PM By: Terry Hurst RN, BSN Previous Signature: 05/31/2019 3:09:28 PM Version By: Terry Keeler PA-C Entered By: [name] on 05/31/2019 15:28:16

## 2019-06-03 ENCOUNTER — Encounter (HOSPITAL_BASED_OUTPATIENT_CLINIC_OR_DEPARTMENT_OTHER): Payer: Medicare HMO | Admitting: Internal Medicine

## 2019-06-03 ENCOUNTER — Other Ambulatory Visit: Payer: Self-pay

## 2019-06-03 DIAGNOSIS — S81811D Laceration without foreign body, right lower leg, subsequent encounter: Secondary | ICD-10-CM | POA: Diagnosis not present

## 2019-06-03 DIAGNOSIS — Z85828 Personal history of other malignant neoplasm of skin: Secondary | ICD-10-CM | POA: Diagnosis not present

## 2019-06-03 DIAGNOSIS — W228XXD Striking against or struck by other objects, subsequent encounter: Secondary | ICD-10-CM | POA: Diagnosis not present

## 2019-06-03 DIAGNOSIS — I87311 Chronic venous hypertension (idiopathic) with ulcer of right lower extremity: Secondary | ICD-10-CM | POA: Diagnosis not present

## 2019-06-03 DIAGNOSIS — L97812 Non-pressure chronic ulcer of other part of right lower leg with fat layer exposed: Secondary | ICD-10-CM | POA: Diagnosis not present

## 2019-06-03 DIAGNOSIS — E785 Hyperlipidemia, unspecified: Secondary | ICD-10-CM | POA: Diagnosis not present

## 2019-06-03 DIAGNOSIS — Z7952 Long term (current) use of systemic steroids: Secondary | ICD-10-CM | POA: Diagnosis not present

## 2019-06-03 DIAGNOSIS — J849 Interstitial pulmonary disease, unspecified: Secondary | ICD-10-CM | POA: Diagnosis not present

## 2019-06-03 NOTE — Progress Notes (Signed)
[name], [name] ([id number]) Visit Report for 05/31/2019 Arrival Information Details Patient Name: Date of Service: [name], [name] 05/31/2019 1:45 PM Medical Record [id number] Patient Account Number: [id number] Date of Birth/Sex: Treating RN: 1935-02-16 (85 y.o. [name] Primary Care Addy Mcmannis: [name] Other Clinician: Referring Carleigh Buccieri: Treating Elmarie Devlin/Extender:Stone III, Jolaine Click, Moses Manners in Treatment: 6 Visit Information History Since Last Visit All ordered tests and consults were completed: No Patient Arrived: Ambulatory Added or deleted any medications: No Arrival Time: 13:46 Any new allergies or adverse reactions: No Accompanied By: self Had a fall or experienced change in No Transfer Assistance: None activities of daily living that may affect Patient Identification Verified: Yes risk of falls: Secondary Verification Process Yes Signs or symptoms of abuse/neglect since last No Completed: visito Patient Requires Transmission-Based No Hospitalized since last visit: No Precautions: Implantable device outside of the clinic excluding No Patient Has Alerts: No cellular tissue based products placed in the center since last visit: Has Dressing in Place as Prescribed: Yes Has Compression in Place as Prescribed: Yes Pain Present Now: No Electronic Signature(s) Signed: 05/31/2019 4:56:09 PM By: Carlene Coria RN Entered By: Carlene Coria on 05/31/2019 13:51:06 -------------------------------------------------------------------------------- Compression Therapy Details Patient Name: Date of Service: [name], [name] 05/31/2019 1:45 PM Medical Record [id number] Patient Account Number: [id number] Date of Birth/Sex: Treating RN: 1935/01/21 (85 y.o. [name] Primary Care Angeldejesus Callaham: [name] Other Clinician: Referring Harveer Sadler: Treating Dorethea Strubel/Extender:Stone III, Jolaine Click, Moses Manners in Treatment: 6 Compression Therapy Performed  for Wound Wound #1 Right,Lateral Lower Leg Assessment: Performed By: Clinician Deon Pilling, RN Compression Type: Three Layer Post Procedure Diagnosis Same as Pre-procedure Electronic Signature(s) Signed: 06/03/2019 4:55:48 PM By: Kela Millin Entered By: Kela Millin on 05/31/2019 14:57:17 -------------------------------------------------------------------------------- Encounter Discharge Information Details Patient Name: Date of Service: [name], [name] 05/31/2019 1:45 PM Medical Record [id number] Patient Account Number: [id number] Date of Birth/Sex: Treating RN: 12/03/1934 (85 y.o. [name], [name] Primary Care Bernhard Koskinen: [name] Other Clinician: Referring Keeley Sussman: Treating [name]/Extender:Stone III, Jolaine Click, Moses Manners in Treatment: 6 Encounter Discharge Information Items Post Procedure Vitals Discharge Condition: Stable Temperature (F): 97.1 Ambulatory Status: Ambulatory Pulse (bpm): 90 Discharge Destination: Home Respiratory Rate (breaths/min): 22 Transportation: Private Auto Blood Pressure (mmHg): 134/87 Accompanied By: self Schedule Follow-up Appointment: Yes Clinical Summary of Care: Electronic Signature(s) Signed: 05/31/2019 4:58:22 PM By: Deon Pilling Entered By: Deon Pilling on 05/31/2019 15:11:52 -------------------------------------------------------------------------------- Lower Extremity Assessment Details Patient Name: Date of Service: [name], RUEGER 05/31/2019 1:45 PM Medical Record [id number] Patient Account Number: [id number] Date of Birth/Sex: Treating RN: May 12, 1934 (85 y.o. [name] Primary Care Eulalia Ellerman: [name] Other Clinician: Referring Emmarose Klinke: Treating [name]/Extender:Stone III, Jolaine Click, Moses Manners in Treatment: 6 Edema Assessment Assessed: [Left: No] [Right: No] Edema: [Left: Ye] [Right: s] Calf Left: Right: Point of Measurement: cm From Medial Instep cm 31.5 cm Ankle Left:  Right: Point of Measurement: cm From Medial Instep cm 21.3 cm Electronic Signature(s) Signed: 05/31/2019 4:56:09 PM By: Carlene Coria RN Entered By: Carlene Coria on 05/31/2019 14:00:07 -------------------------------------------------------------------------------- [name] Details Patient Name: Date of Service: [name] 05/31/2019 1:45 PM Medical Record [id number] Patient Account Number: [id number] Date of Birth/Sex: Treating RN: [birth year] (85 y.o. [name] Primary Care Ghazi Rumpf: [name] Other Clinician: Referring Abdulahad Mederos: Treating Jauna Raczynski/Extender:Stone III, Jolaine Click, Moses Manners in Treatment: 6 Active Inactive Pain, Acute or Chronic Nursing Diagnoses: Pain, acute or chronic: actual or potential Goals: Patient/caregiver will verbalize adequate pain control between visits Date Initiated: 04/19/2019  Target Resolution Date: 06/28/2019 Goal Status: Active Interventions: Provide education on pain management Notes: Wound/Skin Impairment Nursing Diagnoses: Impaired tissue integrity Goals: Ulcer/skin breakdown will have a volume reduction of 30% by week 4 Date Initiated: 04/19/2019 Target Resolution Date: 06/28/2019 Goal Status: Active Interventions: Provide education on ulcer and skin care Notes: Electronic Signature(s) Signed: 06/03/2019 4:55:48 PM By: Kela Millin Entered By: Kela Millin on 05/31/2019 14:59:42 -------------------------------------------------------------------------------- Pain Assessment Details Patient Name: Date of Service: [name], [name] 05/31/2019 1:45 PM Medical Record [id number] Patient Account Number: [id number] Date of Birth/Sex: Treating RN: 1935-01-24 (85 y.o. [name] Primary Care Cythia Bachtel: [name] Other Clinician: Referring Damari Hiltz: Treating Dian Minahan/Extender:Stone III, Jolaine Click, Moses Manners in Treatment: 6 Active Problems Location of Pain Severity and  Description of Pain Patient Has Paino No Site Locations Pain Management and Medication Current Pain Management: Electronic Signature(s) Signed: 05/31/2019 4:56:09 PM By: Carlene Coria RN Entered By: Carlene Coria on 05/31/2019 13:51:46 -------------------------------------------------------------------------------- Patient/Caregiver Education Details Patient Name: Date of Service: [name] 4/2/2021andnbsp1:45 PM Medical Record [id number] Patient Account Number: [id number] Date of Birth/Gender: August 12, 1934 (84 y.o. F) Treating RN: Kela Millin Primary Care Physician: [name] Other Clinician: Referring Physician: Treating Physician/Extender:Stone III, Jolaine Click, Moses Manners in Treatment: 6 Education Assessment Education Provided To: Patient Education Topics Provided Pain: Methods: Explain/Verbal Responses: State content correctly Wound/Skin Impairment: Methods: Explain/Verbal Responses: State content correctly Electronic Signature(s) Signed: 06/03/2019 4:55:48 PM By: Kela Millin Entered By: Kela Millin on 05/31/2019 15:00:02 -------------------------------------------------------------------------------- Wound Assessment Details Patient Name: Date of Service: [name], [name] 05/31/2019 1:45 PM Medical Record [id number] Patient Account Number: [id number] Date of Birth/Sex: Treating RN: 1934/12/22 (85 y.o. [name] Primary Care Evert Wenrich: [name] Other Clinician: Referring Craige Patel: Treating Kavir Savoca/Extender:Stone III, Jolaine Click, Moses Manners in Treatment: 6 Wound Status Wound Number: 1 Primary Etiology: Venous Leg Ulcer Wound Location: Right, Lateral Lower Leg Wound Status: Open Wounding Event: Trauma Comorbid History: Cataracts, Confinement Anxiety Date Acquired: 02/27/2019 Weeks Of Treatment: 6 Clustered Wound: No Wound Measurements Length: (cm) 1.6 % Reduct Width: (cm) 1.4 % Reduct Depth: (cm) 0.1  Epitheli Area: (cm) 1.759 Tunneli Volume: (cm) 0.176 Undermi Wound Description Full Thickness Without Exposed Support Foul Od Classification: Structures Slough/ Wound Wound Flat and Intact Margin: Exudate Medium Amount: Exudate Serosanguineous Type: Exudate red, brown Color: Wound Bed Granulation Amount: Medium (34-66%) Granulation Quality: Red Fascia E Necrotic Amount: Medium (34-66%) Fat Laye Necrotic Quality: Adherent Slough Tendon E Muscle E Joint Ex Bone Exp or After Cleansing: No Fibrino Yes Exposed Structure xposed: No r (Subcutaneous Tissue) Exposed: Yes xposed: No xposed: No posed: No osed: No ion in Area: 40.8% ion in Volume: 40.7% alization: Small (1-33%) ng: No ning: No Electronic Signature(s) Signed: 05/31/2019 4:56:09 PM By: Carlene Coria RN Entered By: Carlene Coria on 05/31/2019 14:00:45 -------------------------------------------------------------------------------- Wound Assessment Details Patient Name: Date of Service: [name], [name] 05/31/2019 1:45 PM Medical Record [id number] Patient Account Number: [id number] Date of Birth/Sex: Treating RN: 06-Feb-1935 (85 y.o. [name] Primary Care Leo Weyandt: [name] Other Clinician: Referring Estie Sproule: Treating Emree Locicero/Extender:Stone III, Jolaine Click, Moses Manners in Treatment: 6 Wound Status Wound Number: 2 Primary Etiology: Venous Leg Ulcer Wound Location: Right, Medial Lower Leg Wound Status: Open Wounding Event: Trauma Comorbid History: Cataracts, Confinement Anxiety Date Acquired: 02/17/2019 Weeks Of Treatment: 6 Clustered Wound: No Wound Measurements Length: (cm) 0.6 % Reduct Width: (cm) 0.4 % Reduct Depth: (cm) 0.1 Epitheli Area: (cm) 0.188 Tunneli Volume: (cm) 0.019 Undermi Wound Description Classification: Full Thickness Without Exposed Support  Foul Od Structures Slough/ Wound Flat and Intact Margin: Exudate Exudate Medium Amount: Exudate  Serosanguineous Type: Exudate red, brown Color: Wound Bed Granulation Amount: Large (67-100%) Granulation Quality: Red Fascia Ex Necrotic Amount: None Present (0%) Fat Layer Tendon Ex Muscle Ex Joint Exp Bone Expo or After Cleansing: No Fibrino No Exposed Structure posed: No (Subcutaneous Tissue) Exposed: Yes posed: No posed: No osed: No sed: No ion in Area: 20.3% ion in Volume: 20.8% alization: Small (1-33%) ng: No ning: No Electronic Signature(s) Signed: 05/31/2019 4:56:09 PM By: Carlene Coria RN Entered By: Carlene Coria on 05/31/2019 14:00:55 -------------------------------------------------------------------------------- Vitals Details Patient Name: Date of Service: [name] 05/31/2019 1:45 PM Medical Record [id number] Patient Account Number: [id number] Date of Birth/Sex: Treating RN: Jul 17, 1934 (85 y.o. [name] Primary Care Eevie Lapp: [name] Other Clinician: Referring Praise Stennett: Treating Oron Westrup/Extender:Stone III, Jolaine Click, Moses Manners in Treatment: 6 Vital Signs Time Taken: 13:51 Temperature (F): 97.1 Height (in): 64 Pulse (bpm): 90 Weight (lbs): 125 Respiratory Rate (breaths/min): 22 Body Mass Index (BMI): 21.5 Blood Pressure (mmHg): 134/87 Reference Range: 80 - 120 mg / dl Electronic Signature(s) Signed: 05/31/2019 4:56:09 PM By: Carlene Coria RN Entered By: Carlene Coria on 05/31/2019 13:51:39

## 2019-06-05 DIAGNOSIS — L718 Other rosacea: Secondary | ICD-10-CM | POA: Diagnosis not present

## 2019-06-05 DIAGNOSIS — Z85828 Personal history of other malignant neoplasm of skin: Secondary | ICD-10-CM | POA: Diagnosis not present

## 2019-06-05 DIAGNOSIS — L820 Inflamed seborrheic keratosis: Secondary | ICD-10-CM | POA: Diagnosis not present

## 2019-06-05 DIAGNOSIS — L814 Other melanin hyperpigmentation: Secondary | ICD-10-CM | POA: Diagnosis not present

## 2019-06-05 NOTE — Progress Notes (Signed)
SCARLETH, BRAME ([id number]) Visit Report for 06/03/2019 Arrival Information Details Patient Name: Date of Service: [name], [name] 06/03/2019 3:00 PM Medical Record [id number] Patient Account Number: [id number] Date of Birth/Sex: Treating RN: 09/08/1934 (85 y.o. [name] Primary Care Briona Korpela: [name] Other Clinician: Referring Skyla Champagne: Treating Kristyl Athens/Extender:Robson, Vista Lawman, Moses Manners in Treatment: 6 Visit Information History Since Last Visit All ordered tests and consults were completed: No Patient Arrived: Ambulatory Added or deleted any medications: No Arrival Time: 15:52 Any new allergies or adverse reactions: No Accompanied By: self Had a fall or experienced change in No Transfer Assistance: None activities of daily living that may affect Patient Identification Verified: Yes risk of falls: Secondary Verification Process Yes Signs or symptoms of abuse/neglect since last No Completed: visito Patient Requires Transmission-Based No Hospitalized since last visit: No Precautions: Implantable device outside of the clinic excluding No Patient Has Alerts: No cellular tissue based products placed in the center since last visit: Has Dressing in Place as Prescribed: Yes Has Compression in Place as Prescribed: Yes Pain Present Now: Yes Electronic Signature(s) Signed: 06/04/2019 6:43:44 PM By: Carlene Coria RN Entered By: Carlene Coria on 06/03/2019 16:16:17 -------------------------------------------------------------------------------- Compression Therapy Details Patient Name: Date of Service: [name], [name] 06/03/2019 3:00 PM Medical Record [id number] Patient Account Number: [id number] Date of Birth/Sex: Treating RN: April 03, 1934 (85 y.o. [name] Primary Care Loukisha Gunnerson: [name] Other Clinician: Referring Marley Charlot: Treating Kensi Karr/Extender:Robson, Vista Lawman, Moses Manners in Treatment: 6 Compression Therapy Performed for  Wound Wound #1 Right,Lateral Lower Leg Assessment: Performed By: Clinician Carlene Coria, RN Compression Type: Three Layer Electronic Signature(s) Signed: 06/04/2019 6:43:44 PM By: Carlene Coria RN Entered By: Carlene Coria on 06/03/2019 16:19:25 -------------------------------------------------------------------------------- Compression Therapy Details Patient Name: Date of Service: [name], [name] 06/03/2019 3:00 PM Medical Record [id number] Patient Account Number: [id number] Date of Birth/Sex: Treating RN: [birth year] (85 y.o. [name] Primary Care Aleysha Meckler: [name] Other Clinician: Referring Yesenia Fontenette: Treating Jameriah Trotti/Extender:Robson, Vista Lawman, Moses Manners in Treatment: 6 Compression Therapy Performed for Wound Wound #2 Right,Medial Lower Leg Assessment: Performed By: Clinician Carlene Coria, RN Compression Type: Three Layer Electronic Signature(s) Signed: 06/04/2019 6:43:44 PM By: Carlene Coria RN Entered By: Carlene Coria on 06/03/2019 16:19:26 -------------------------------------------------------------------------------- Encounter Discharge Information Details Patient Name: Date of Service: Terry Lorenzo. 06/03/2019 3:00 PM Medical Record [id number] Patient Account Number: [id number] Date of Birth/Sex: Treating RN: November 06, 1934 (85 y.o. [name] Primary Care Thyra Yinger: [name] Other Clinician: Referring Sheyna Pettibone: Treating Dejon Lukas/Extender:Robson, Vista Lawman, Moses Manners in Treatment: 6 Encounter Discharge Information Items Discharge Condition: Stable Ambulatory Status: Ambulatory Discharge Destination: Home Transportation: Private Auto Accompanied By: self Schedule Follow-up Appointment: Yes Clinical Summary of Care: Patient Declined Electronic Signature(s) Signed: 06/04/2019 6:43:44 PM By: Carlene Coria RN Entered By: Carlene Coria on 06/03/2019  16:21:16 -------------------------------------------------------------------------------- Patient/Caregiver Education Details Patient Name: Date of Service: [name], Terry P. 4/5/2021andnbsp3:00 PM Medical Record [id number] Patient Account Number: [id number] Date of Birth/Gender: Treating RN: June 02, 1934 (84 y.o. [name] Primary Care Physician: [name] Other Clinician: Referring Physician: Treating Physician/Extender:Robson, Vista Lawman, Moses Manners in Treatment: 6 Education Assessment Education Provided To: Patient Education Topics Provided Wound/Skin Impairment: Methods: Explain/Verbal Responses: State content correctly Electronic Signature(s) Signed: 06/04/2019 6:43:44 PM By: Carlene Coria RN Entered By: Carlene Coria on 06/03/2019 16:21:02 -------------------------------------------------------------------------------- Wound Assessment Details Patient Name: Date of Service: [name], [name] 06/03/2019 3:00 PM Medical Record [id number] Patient Account Number: [id number] Date of Birth/Sex: Treating RN: Dec 09, 1934 (85 y.o. [name] Primary Care Jonhatan Hearty: Laurann Montana,  John Other Clinician: Referring Jahaan Vanwagner: Treating Dyllan Hughett/Extender:Robson, Vista Lawman, Moses Manners in Treatment: 6 Wound Status Wound Number: 1 Primary Etiology: Venous Leg Ulcer Wound Location: Right, Lateral Lower Leg Wound Status: Open Wounding Event: Trauma Comorbid History: Cataracts, Confinement Anxiety Date Acquired: 02/27/2019 Weeks Of Treatment: 6 Clustered Wound: No Wound Measurements Length: (cm) 1.6 % Reduction Width: (cm) 1.4 % Reduct Depth: (cm) 0.1 Epitheli Area: (cm) 1.759 Tunneli Volume: (cm) 0.176 Undermi Wound Description Full Thickness Without Exposed Support Foul Od Classification: Structures Slough/ Wound Flat and Intact Margin: Exudate Medium Amount: Exudate Serosanguineous Type: Exudate red, brown Color: Wound Bed Granulation  Amount: Medium (34-66%) Granulation Quality: Red Fascia E Necrotic Amount: Medium (34-66%) Fat Laye Necrotic Quality: Adherent Slough Tendon E Muscle E Joint Ex Bone Exp or After Cleansing: No Fibrino Yes Exposed Structure xposed: No r (Subcutaneous Tissue) Exposed: Yes xposed: No xposed: No posed: No osed: No in Area: 40.8% ion in Volume: 40.7% alization: Small (1-33%) ng: No ning: No Treatment Notes Wound #1 (Right, Lateral Lower Leg) 1. Cleanse With Wound Cleanser 3. Primary Dressing Applied Other primary dressing (specifiy in notes) 4. Secondary Dressing Kerramax/Xtrasorb 6. Support Layer Applied 3 layer compression wrap Notes apligraft Electronic Signature(s) Signed: 06/04/2019 6:43:44 PM By: Carlene Coria RN Entered By: Carlene Coria on 06/03/2019 16:18:13 -------------------------------------------------------------------------------- Wound Assessment Details Patient Name: Date of Service: [name], [name] 06/03/2019 3:00 PM Medical Record [id number] Patient Account Number: [id number] Date of Birth/Sex: Treating RN: 10-Sep-1934 (85 y.o. [name] Primary Care Aria Pickrell: [name] Other Clinician: Referring Linsie Lupo: Treating Blakelynn Scheeler/Extender:Robson, Vista Lawman, Moses Manners in Treatment: 6 Wound Status Wound Number: 2 Primary Etiology: Venous Leg Ulcer Wound Location: Right, Medial Lower Leg Wound Status: Open Wounding Event: Trauma Comorbid History: Cataracts, Confinement Anxiety Date Acquired: 02/17/2019 Weeks Of Treatment: 6 Clustered Wound: No Wound Measurements Length: (cm) 0.6 % Reduct Width: (cm) 0.4 % Reduct Depth: (cm) 0.1 Epitheli Area: (cm) 0.188 Tunneli Volume: (cm) 0.019 Undermi Wound Description Full Thickness Without Exposed Support Foul Od Classification: Structures Slough/ Wound Flat and Intact Margin: Exudate Medium Amount: Exudate Serosanguineous Type: Exudate red, brown Color: Wound Bed Granulation  Amount: Large (67-100%) Granulation Quality: Red Fascia E Necrotic Amount: None Present (0%) Fat Laye Tendon E Muscle E Joint Ex Bone Exp or After Cleansing: No Fibrino No Exposed Structure xposed: No r (Subcutaneous Tissue) Exposed: Yes xposed: No xposed: No posed: No osed: No ion in Area: 20.3% ion in Volume: 20.8% alization: Small (1-33%) ng: No ning: No Treatment Notes Wound #2 (Right, Medial Lower Leg) 1. Cleanse With Wound Cleanser 3. Primary Dressing Applied Other primary dressing (specifiy in notes) 4. Secondary Dressing Kerramax/Xtrasorb 6. Support Layer Applied 3 layer compression wrap Notes apligraft Electronic Signature(s) Signed: 06/04/2019 6:43:44 PM By: Carlene Coria RN Entered By: Carlene Coria on 06/03/2019 16:18:35 -------------------------------------------------------------------------------- [place] Details Patient Name: Date of Service: Terry Lorenzo. 06/03/2019 3:00 PM Medical Record [id number] Patient Account Number: [id number] Date of Birth/Sex: Treating RN: 1935-01-26 (85 y.o. [name] Primary Care Aalliyah Kilker: [name] Other Clinician: Referring Analina Filla: Treating Latroy Gaymon/Extender:Robson, Vista Lawman, Moses Manners in Treatment: 6 Vital Signs Time Taken: 16:16 Temperature (F): 97.7 Height (in): 64 Pulse (bpm): 90 Weight (lbs): 125 Respiratory Rate (breaths/min): 18 Body Mass Index (BMI): 21.5 Blood Pressure (mmHg): 126/73 Reference Range: 80 - 120 mg / dl Electronic Signature(s) Signed: 06/04/2019 6:43:44 PM By: Carlene Coria RN Entered By: Carlene Coria on 06/03/2019 16:16:45

## 2019-06-05 NOTE — Progress Notes (Signed)
[name], [name] ([id number]) Visit Report for 06/03/2019 SuperBill Details Patient Name: Date of Service: [name], [name] 06/03/2019 Medical Record [id number] Patient Account Number: [id number] Date of Birth/Sex: Treating RN: April 09, 1934 (85 y.o. [name] Primary Care Provider: [name] Other Clinician: Referring Provider: Treating Provider/Extender:Elani Delph, Vista Lawman, Moses Manners in Treatment: 6 Diagnosis Coding ICD-10 Codes Code Description I87.311 Chronic venous hypertension (idiopathic) with ulcer of right lower extremity L97.812 Non-pressure chronic ulcer of other part of right lower leg with fat layer exposed S81.811D Laceration without foreign body, right lower leg, subsequent encounter Z92.241 Personal history of systemic steroid therapy Facility Procedures CPT4 Code Description Modifier Quantity [id number] (Facility Use Only) [phone] - APPLY MULTLAY COMPRS LWR RT LEG 1 Electronic Signature(s) Signed: 06/03/2019 5:24:04 PM By: Linton Ham MD Signed: 06/04/2019 6:43:44 PM By: Carlene Coria RN Entered By: Carlene Coria on 06/03/2019 16:21:27

## 2019-06-07 ENCOUNTER — Other Ambulatory Visit: Payer: Self-pay

## 2019-06-07 ENCOUNTER — Encounter (HOSPITAL_BASED_OUTPATIENT_CLINIC_OR_DEPARTMENT_OTHER): Payer: Medicare HMO | Admitting: Internal Medicine

## 2019-06-07 DIAGNOSIS — Z85828 Personal history of other malignant neoplasm of skin: Secondary | ICD-10-CM | POA: Diagnosis not present

## 2019-06-07 DIAGNOSIS — W228XXD Striking against or struck by other objects, subsequent encounter: Secondary | ICD-10-CM | POA: Diagnosis not present

## 2019-06-07 DIAGNOSIS — J849 Interstitial pulmonary disease, unspecified: Secondary | ICD-10-CM | POA: Diagnosis not present

## 2019-06-07 DIAGNOSIS — S81811D Laceration without foreign body, right lower leg, subsequent encounter: Secondary | ICD-10-CM | POA: Diagnosis not present

## 2019-06-07 DIAGNOSIS — I87311 Chronic venous hypertension (idiopathic) with ulcer of right lower extremity: Secondary | ICD-10-CM | POA: Diagnosis not present

## 2019-06-07 DIAGNOSIS — Z7952 Long term (current) use of systemic steroids: Secondary | ICD-10-CM | POA: Diagnosis not present

## 2019-06-07 DIAGNOSIS — L97812 Non-pressure chronic ulcer of other part of right lower leg with fat layer exposed: Secondary | ICD-10-CM | POA: Diagnosis not present

## 2019-06-07 DIAGNOSIS — E785 Hyperlipidemia, unspecified: Secondary | ICD-10-CM | POA: Diagnosis not present

## 2019-06-07 NOTE — Progress Notes (Addendum)
[name], [name] ([id number]) Visit Report for 06/07/2019 Arrival Information Details Patient Name: Date of Service: [name], [name] 06/07/2019 4:00 PM Medical Record [id number] Patient Account Number: [id number] Date of Birth/Sex: Treating RN: [birth year] (85 y.o. [name] Primary Care Klaus Casteneda: [name] Other Clinician: Referring [name]: Treating Yang Rack/Extender:[name] in Treatment: 7 Visit Information History Since Last Visit Added or deleted any medications: No Patient Arrived: Ambulatory Any new allergies or adverse reactions: No Arrival Time: 16:41 Had a fall or experienced change in No Accompanied By: self activities of daily living that may affect Transfer Assistance: None risk of falls: Patient Identification Verified: Yes Signs or symptoms of abuse/neglect since last No Secondary Verification Process Yes visito Completed: Hospitalized since last visit: No Patient Requires Transmission-Based No Implantable device outside of the clinic excluding No Precautions: cellular tissue based products placed in the center Patient Has Alerts: No since last visit: Has Dressing in Place as Prescribed: Yes Has Compression in Place as Prescribed: Yes Pain Present Now: No Electronic Signature(s) Signed: 06/07/2019 6:10:03 PM By: Baruch Gouty RN, BSN Entered By: Baruch Gouty on 06/07/2019 16:46:08 -------------------------------------------------------------------------------- Compression Therapy Details Patient Name: Date of Service: Terry Lorenzo. 06/07/2019 4:00 PM Medical Record [id number] Patient Account Number: [id number] Date of Birth/Sex: Treating RN: [birth year] (85 y.o. [name] Primary Care [name]: [name] Other Clinician: Referring [name]: Treating Alter Moss/Extender:[name] in Treatment: 7 Compression Therapy Performed for Wound Wound #2 Right,Medial Lower  Leg Assessment: Performed By: Clinician Baruch Gouty, RN Compression Type: Three Layer Electronic Signature(s) Signed: 06/10/2019 5:39:59 PM By: Baruch Gouty RN, BSN Entered By: Baruch Gouty on 06/10/2019 14:22:32 -------------------------------------------------------------------------------- Encounter Discharge Information Details Patient Name: Date of Service: [name] 06/07/2019 4:00 PM Medical Record [id number] Patient Account Number: [id number] Date of Birth/Sex: Treating RN: [birth year] (85 y.o. [name] Primary Care [name]: [name] Other Clinician: Referring Melania Kirks: Treating Macyn Shropshire/Extender:[name] in Treatment: 7 Encounter Discharge Information Items Discharge Condition: Stable Ambulatory Status: Ambulatory Discharge Destination: Home Transportation: Private Auto Accompanied By: self Schedule Follow-up Appointment: Yes Clinical Summary of Care: Patient Declined Electronic Signature(s) Signed: 06/10/2019 5:39:59 PM By: Baruch Gouty RN, BSN Entered By: Baruch Gouty on 06/10/2019 14:23:36 -------------------------------------------------------------------------------- Patient/Caregiver Education Details Patient Name: Date of Service: [name], Terry P. 4/9/2021andnbsp4:00 PM Medical Record [id number] Patient Account Number: [id number] Date of Birth/Gender: Treating RN: Jul 13, 1934 (85 y.o. [name] Primary Care Physician: [name] Other Clinician: Referring Physician: Treating Physician/Extender:[name] in Treatment: 7 Education Assessment Education Provided To: Patient Education Topics Provided Venous: Methods: Explain/Verbal Responses: Reinforcements needed, State content correctly Electronic Signature(s) Signed: 06/10/2019 5:39:59 PM By: Baruch Gouty RN, BSN Entered By: Baruch Gouty on 06/10/2019  14:23:21 -------------------------------------------------------------------------------- Wound Assessment Details Patient Name: Date of Service: Terry Lorenzo. 06/07/2019 4:00 PM Medical Record [id number] Patient Account Number: [id number] Date of Birth/Sex: Treating RN: Apr 29, 1934 (85 y.o. [name] Primary Care Izan Miron: [name] Other Clinician: Referring [name]: Treating [name]/Extender:[name] in Treatment: 7 Wound Status Wound Number: 1 Primary Etiology: Venous Leg Ulcer Wound Location: Right, Lateral Lower Leg Wound Status: Open Wounding Event: Trauma Comorbid History: Cataracts, Confinement Anxiety Date Acquired: 02/27/2019 Weeks Of Treatment: 7 Clustered Wound: No Wound Measurements Length: (cm) 1.6 % Reduct Width: (cm) 1.4 % Reduct Depth: (cm) 0.1 Epitheli Area: (cm) 1.759 Tunneli Volume: (cm) 0.176 Undermi Wound Description Classification: Full Thickness Without Exposed Support Foul Od Structures Slough/ Wound Flat and Intact Margin:  Exudate Medium Amount: Exudate Serosanguineous Type: Exudate red, brown Color: Wound Bed Granulation Amount: Medium (34-66%) Granulation Quality: Red Fascia E Necrotic Amount: Medium (34-66%) Fat Laye Necrotic Quality: Adherent Slough Tendon E Muscle E Joint Ex Bone Exp Assessment Notes unable to visualize wound bed. adaptic intact Treatment Notes Wound #1 (Right, Lateral Lower Leg) 2. Periwound Care Moisturizing lotion 4. Secondary Dressing Other secondary dressing (specify in notes) 6. Support Layer Applied 3 layer compression wrap Notes zetuvit Electronic Signature(s) Signed: 06/10/2019 5:39:59 PM By: Baruch Gouty RN, BSN Entered By: Baruch Gouty on 04/ or After Cleansing: No Fibrino Yes Exposed Structure xposed: No r (Subcutaneous Tissue) Exposed: Yes xposed: No xposed: No posed: No osed: No 01/2020 14:21:41 ion in Area: 40.8% ion in  Volume: 40.7% alization: Small (1-33%) ng: No ning: No -------------------------------------------------------------------------------- Wound Assessment Details Patient Name: Date of Service: [name], [name] 06/07/2019 4:00 PM Medical Record [id number] Patient Account Number: [id number] Date of Birth/Sex: Treating RN: 10/24/1934 (85 y.o. [name] Primary Care Rosselyn Martha: [name] Other Clinician: Referring Shifa Brisbon: Treating Kamariyah Timberlake/Extender:[name] in Treatment: 7 Wound Status Wound Number: 2 Primary Etiology: Venous Leg Ulcer Wound Location: Right, Medial Lower Leg Wound Status: Open Wounding Event: Trauma Comorbid History: Cataracts, Confinement Anxiety Date Acquired: 02/17/2019 Weeks Of Treatment: 7 Clustered Wound: No Wound Measurements Length: (cm) 0.6 % Reduct Width: (cm) 0.4 % Reduct Depth: (cm) 0.1 Epitheli Area: (cm) 0.188 Volume: (cm) 0.019 Wound Description Classification: Full Thickness Without Exposed Support Structures Wound Flat and Intact Margin: Exudate Medium Amount: Exudate Serosanguineous Type: Exudate red, brown red, brown Color: Wound Bed Granulation Amount: Large (67-100%) Granulation Quality: Red Necrotic Amount: None Present (0%) Foul Odor After Cleansing: No Slough/Fibrino No Exposed Structure Fascia Exposed: No Fat Layer (Subcutaneous Tissue) Exposed: Yes Tendon Exposed: No Muscle Exposed: No Joint Exposed: No Bone Exposed: No ion in Area: 20.3% ion in Volume: 20.8% alization: Small (1-33%) Assessment Notes unable to visualize wound bed, adaptic intact Treatment Notes Wound #2 (Right, Medial Lower Leg) 2. Periwound Care Moisturizing lotion 4. Secondary Dressing Other secondary dressing (specify in notes) 6. Support Layer Applied 3 layer compression wrap Notes zetuvit Electronic Signature(s) Signed: 06/10/2019 5:39:59 PM By: Baruch Gouty RN, BSN Entered By:  Baruch Gouty on 06/10/2019 14:22:12 -------------------------------------------------------------------------------- [place] Details Patient Name: Date of Service: Terry Lorenzo. 06/07/2019 4:00 PM Medical Record [id number] Patient Account Number: [id number] Date of Birth/Sex: Treating RN: 1934/11/03 (85 y.o. [name] Primary Care Mariesha Venturella: [name] Other Clinician: Referring Kiamesha Samet: Treating Taahir Grisby/Extender:[name] in Treatment: 7 Vital Signs Time Taken: 16:46 Temperature (F): 98.0 Height (in): 64 Pulse (bpm): 92 Source: Stated Respiratory Rate (breaths/min): 18 Weight (lbs): 125 Blood Pressure (mmHg): 153/96 Source: Stated Reference Range: 80 - 120 mg / dl Body Mass Index (BMI): 21.5 Electronic Signature(s) Signed: 06/07/2019 6:10:03 PM By: Baruch Gouty RN, BSN Entered By: Baruch Gouty on 06/07/2019 16:47:08

## 2019-06-10 DIAGNOSIS — R0600 Dyspnea, unspecified: Secondary | ICD-10-CM | POA: Diagnosis not present

## 2019-06-10 DIAGNOSIS — Z8249 Family history of ischemic heart disease and other diseases of the circulatory system: Secondary | ICD-10-CM | POA: Diagnosis not present

## 2019-06-10 DIAGNOSIS — Z79899 Other long term (current) drug therapy: Secondary | ICD-10-CM | POA: Diagnosis not present

## 2019-06-10 DIAGNOSIS — R0609 Other forms of dyspnea: Secondary | ICD-10-CM | POA: Diagnosis not present

## 2019-06-10 DIAGNOSIS — R05 Cough: Secondary | ICD-10-CM | POA: Diagnosis not present

## 2019-06-10 DIAGNOSIS — J849 Interstitial pulmonary disease, unspecified: Secondary | ICD-10-CM | POA: Diagnosis not present

## 2019-06-11 ENCOUNTER — Telehealth: Payer: Self-pay | Admitting: Internal Medicine

## 2019-06-11 ENCOUNTER — Ambulatory Visit (INDEPENDENT_AMBULATORY_CARE_PROVIDER_SITE_OTHER): Payer: Medicare HMO | Admitting: Internal Medicine

## 2019-06-11 NOTE — Progress Notes (Signed)
BRONTE, SABADO ([id number]) Visit Report for 06/07/2019 SuperBill Details Patient Name: Date of Service: [name], [name] 06/07/2019 Medical Record [id number] Patient Account Number: [id number] Date of Birth/Sex: Treating RN: 04-17-1934 (85 y.o. [name] Primary Care Provider: [name] Other Clinician: Referring Provider: Treating Provider/Extender:Charonda Hefter, Vista Lawman, Moses Manners in Treatment: 7 Diagnosis Coding ICD-10 Codes Code Description I87.311 Chronic venous hypertension (idiopathic) with ulcer of right lower extremity L97.812 Non-pressure chronic ulcer of other part of right lower leg with fat layer exposed S81.811D Laceration without foreign body, right lower leg, subsequent encounter Z92.241 Personal history of systemic steroid therapy Facility Procedures CPT4 Code Description Modifier Quantity [id number] (Facility Use Only) [phone] - APPLY MULTLAY COMPRS LWR RT LEG 1 Electronic Signature(s) Signed: 06/10/2019 5:39:59 PM By: Baruch Gouty RN, BSN Signed: 06/11/2019 5:51:00 PM By: Linton Ham MD Entered By: Baruch Gouty on 06/10/2019 14:23:52

## 2019-06-11 NOTE — Progress Notes (Signed)
Interstitial Lung Disease Multidisciplinary Conference   [name]    MRN [id number]    DOB 1934/05/04  Primary Care Physician:Griffin, Jenny Reichmann, MD  Referring Physician: Brand Males  Time of Conference: 7.30am- 8.30am Date of conference: 06/11/2019  Location of Conference: -  Virtual  Participating Pulmonary: Dr. Brand Males, MD - yes,  Dr Marshell Garfinkel, MD - yes Pathology: Dr Jaquita Folds, MD - no , Dr Enid Cutter - yes Radiology: Dr Salvatore Marvel MD - n, Dr Vinnie Langton MD - yes,  Dr Lorin Picket, MD - no , Dr Eddie Candle - no Others: Dr Elsworth Soho  Brief History:  Used to see Elsworth Soho and then went to [facility]. [facility] and [facility] since Jan [facility]. Apparently ILD on CT sine 2008. Dx there is IPF v Chronic HP with lean IPF. In jan 2017 started on stteroids low dose due to cough. Was stable all along but last 1 year has progression. Started on ofev Jan 2021 for progessive ILD.  No bronch. No bx. Question is if she has IPF v HP. - implication is for trials  A) What does HRCT indicate?  - latest Is [facility] oct 2020 - can our radiologist see that? B) How  long has she had ILD on CT? - there are reports since 2008. True? C) Exposure: talcum powder, feather pillow for many years till 2 years ago , smoker age [age]  Serology: Results for NYRIE, SIGAL (MRN [id number]) as of 06/11/2019 16:47  Ref. Range 10/20/2014 17:01  Anti Nuclear Antibody (ANA) Latest Ref Range: NEGATIVE  NEG  Cyclic Citrullin Peptide Ab Latest Ref Range: 0.0 - 5.0 U/mL <2.0    MDD discussion of CT scan    - Date or time period of scan: -The October 2020 CT scan of the chest is not available because this was done at [facility].  Dr. Weber Cooks compared the 2011 and 2016: CT scans.  He feels were even between the 2 the disease is progressive.  He says the hallmark is that there is asymmetric lung disease.  There is left diaphragm elevation and he believes this might have something to do with asymmetry.  In  the left lung there is greater disease in the right lung.  However the right lung is more consistent with UIP and is definite of UIP while the left lung he feels this indeterminate for UIP.  There is craniocaudal gradient on both sides but more prominent on the right side which is more diagnostic of UIP.  The air trapping is only minimal.  There is definite of progression.  - What is the final conclusion per 2018 ATS/Fleischner Criteria -right side more consistent with UIP left side indeterminate for UIP with more disease on the left side.   MDD Impression/Recs:  -The CT scans at [facility] read in 2011 and 2016 the odds of the scan being more consistent with hypersensitive pneumonitis is low.  The odds are this is UIP/IPF.  However October 2020 scans from [facility] would be most helpful.  Regardless there is progression and it is appropriate patients on antifibrotic therapy as she is right now  We will try to get CT scan of the chest from [place] in preparation and discussion:  > 30 min    SIGNATURE   Dr. Brand Males, M.D., F.C.C.P,  Pulmonary and Critical Care Medicine Staff Physician, [facility] Director - Interstitial Lung Disease  Program  Pulmonary Fibrosis  [place] at [place], [place], [address]  Pager: [phone], If no answer or between  15:00h - 7:00h: call [phone] Telephone: [phone]  4:46 PM 06/11/2019 ...................................................................................................................[name] References: Diagnosis of Hypersensitivity Pneumonitis in Adults. An Official ATS/JRS/ALAT Clinical Practice Guideline. Ragu G et al, [place] Aug 1;202(3):e36-e69.       Diagnosis of Idiopathic Pulmonary Fibrosis. An Official ATS/ERS/JRS/ALAT Clinical Practice Guideline. Raghu G et al, [place]. 2018 Sep  1;198(5):e44-e68.   IPF Suspected   Histopath ology Pattern      UIP  Probable UIP  Indeterminate for  UIP  Alternative  diagnosis    UIP  IPF  IPF  IPF  Non-IPF dx   HRCT   Probabe UIP  IPF  IPF  IPF (Likely)**  Non-IPF dx  Pattern  Indeterminate for UIP  IPF  IPF (Likely)**  Indeterminate  for IPF**  Non-IPF dx    Alternative diagnosis  IPF (Likely)**/ non-IPF dx  Non-IPF dx  Non-IPF dx  Non-IPF dx     Idiopathic pulmonary fibrosis diagnosis based upon HRCT and Biopsy paterns.  ** IPF is the likely diagnosis when any of following features are present:  . Moderate-to-severe traction bronchiectasis/bronchiolectasis (defined as mild traction bronchiectasis/bronchiolectasis in four or more lobes including the lingual as a lobe, or moderate to severe traction bronchiectasis in two or more lobes) in a man over age 45 years or in a woman over age 24 years . Extensive (>30%) reticulation on HRCT and an age >70 years  . Increased neutrophils and/or absence of lymphocytosis in BAL fluid  . Multidisciplinary discussion reaches a confident diagnosis of IPF.   **Indeterminate for IPF  . Without an adequate biopsy is unlikely to be IPF  . With an adequate biopsy may be reclassified to a more specific diagnosis after multidisciplinary discussion and/or additional consultation.   dx = diagnosis; HRCT = high-resolution computed tomography; IPF = idiopathic pulmonary fibrosis; UIP = usual interstitial pneumonia.

## 2019-06-11 NOTE — Telephone Encounter (Signed)
[name]  We discussed [name]  CT at our MDD ILD confernce 06/11/2019. They want to see the oct 2020 HRCT done at [facility]. Can you have her sign release for CT please.   Also ensure followup  Thanks    SIGNATURE    Dr. Brand Males, M.D., F.C.C.P,  Pulmonary and Critical Care Medicine Staff Physician, [facility] Director - Interstitial Lung Disease  Program  Pulmonary [place] at [place], [place], [address]  Pager: [phone], If no answer or between  15:00h - 7:00h: call [phone] Telephone: [phone]  1:47 PM 06/11/2019

## 2019-06-12 NOTE — Progress Notes (Signed)
VAYLA, WILHELMI ([id number]) Visit Report for 05/09/2019 Arrival Information Details Patient Name: Date of Service: [name], [name] 05/09/2019 10:15 AM Medical Record [name] Patient Account Number: [id number] Date of Birth/Sex: Treating RN: [birth year] (85 y.o. [name], [name] Primary Care Decklin Weddington: [name] Other Clinician: Referring Damen Windsor: Treating Windell Musson/Extender:Robson, Vista Lawman, Moses Manners in Treatment: 2 Visit Information History Since Last Visit Added or deleted any medications: No Patient Arrived: Ambulatory Any new allergies or adverse reactions: No Arrival Time: 11:01 Had a fall or experienced change in No Accompanied By: self activities of daily living that may affect Transfer Assistance: None risk of falls: Patient Identification Verified: Yes Signs or symptoms of abuse/neglect since last No Secondary Verification Process Completed: Yes visito Patient Requires Transmission-Based No Hospitalized since last visit: No Precautions: Implantable device outside of the clinic excluding No Patient Has Alerts: No cellular tissue based products placed in the center since last visit: Has Dressing in Place as Prescribed: Yes Pain Present Now: No Electronic Signature(s) Signed: 06/12/2019 9:21:08 AM By: Sandre Kitty Entered By: Sandre Kitty on 05/09/2019 11:01:46 -------------------------------------------------------------------------------- Compression Therapy Details Patient Name: Date of Service: LATAUSHA, FLAMM 05/09/2019 10:15 AM Medical Record [id number] Patient Account Number: [id number] Date of Birth/Sex: Treating RN: [birth year] (85 y.o. Debby Bud Primary Care Anastaisa Wooding: [name] Other Clinician: Referring Leasa Kincannon: Treating Abelardo Seidner/Extender:Robson, Vista Lawman, Moses Manners in Treatment: 2 Compression Therapy Performed for Wound Wound #1 Right,Lateral Lower Leg Assessment: Performed By: Clinician Baruch Gouty, RN Compression Type: Three Layer Pre Treatment ABI: 1 Post Procedure Diagnosis Same as Pre-procedure Electronic Signature(s) Signed: 05/09/2019 6:00:33 PM By: Deon Pilling Entered By: Deon Pilling on 05/09/2019 11:52:36 -------------------------------------------------------------------------------- Compression Therapy Details Patient Name: Date of Service: [name], [name] 05/09/2019 10:15 AM Medical Record [id number] Patient Account Number: [id number] Date of Birth/Sex: Treating RN: 06/15/1934 (85 y.o. Debby Bud Primary Care Lynzie Cliburn: [name] Other Clinician: Referring Verlin [facility]: Treating Lucero Auzenne/Extender:Robson, Vista Lawman, Moses Manners in Treatment: 2 Compression Therapy Performed for Wound Wound #2 Right,Medial Lower Leg Assessment: Performed By: Clinician Baruch Gouty, RN Compression Type: Three Layer Pre Treatment ABI: 1 Post Procedure Diagnosis Same as Pre-procedure Electronic Signature(s) Signed: 05/09/2019 6:00:33 PM By: Deon Pilling Entered By: Deon Pilling on 05/09/2019 11:52:36 -------------------------------------------------------------------------------- Encounter Discharge Information Details Patient Name: Date of Service: [name], [name] 05/09/2019 10:15 AM Medical Record [id number] Patient Account Number: [id number] Date of Birth/Sex: Treating RN: 1934-06-25 (85 y.o. [name] Primary Care Sher Shampine: [name] Other Clinician: Referring Davionte Lusby: Treating Sybrina Laning/Extender:Robson, Vista Lawman, Moses Manners in Treatment: 2 Encounter Discharge Information Items Post Procedure Vitals Discharge Condition: Stable Temperature (F): 98.6 Ambulatory Status: Ambulatory Pulse (bpm): 91 Discharge Destination: Home Respiratory Rate (breaths/min): 18 Transportation: Private Auto Blood Pressure (mmHg): 150/87 Accompanied By: self Schedule Follow-up Appointment: Yes Clinical Summary of Care: Patient  Declined Electronic Signature(s) Signed: 05/09/2019 5:58:47 PM By: Baruch Gouty RN, BSN Entered By: Baruch Gouty on 05/09/2019 12:12:17 -------------------------------------------------------------------------------- Lower Extremity Assessment Details Patient Name: Date of Service: [name], [name] 05/09/2019 10:15 AM Medical Record [id number] Patient Account Number: [id number] Date of Birth/Sex: Treating RN: 11-25-1934 (85 y.o. [name] Primary Care Corran Lalone: [name] Other Clinician: Referring Juan Olthoff: Treating Lottie Sigman/Extender:Robson, Vista Lawman, Moses Manners in Treatment: 2 Edema Assessment Assessed: [Left: No] [Right: No] Edema: [Left: Ye] [Right: s] Calf Left: Right: Point of Measurement: cm From Medial Instep cm 32 cm Ankle Left: Right: Point of Measurement: cm From Medial Instep cm 21.5 cm Vascular Assessment Pulses: Dorsalis Pedis Palpable: [Right:Yes] Electronic Signature(s) Signed: 05/13/2019  5:44:18 PM By: Levan Hurst RN, BSN Entered By: Levan Hurst on 05/09/2019 11:22:31 -------------------------------------------------------------------------------- Multi Wound Chart Details Patient Name: Date of Service: [name], [name] 05/09/2019 10:15 AM Medical Record [id number] Patient Account Number: [id number] Date of Birth/Sex: Treating RN: Jul 30, 1934 (85 y.o. [name], [name] Primary Care Lovella Hardie: [name] Other Clinician: Referring Shahan Starks: Treating Lansing Sigmon/Extender:Robson, Vista Lawman, Moses Manners in Treatment: 2 Vital Signs Height(in): 64 Pulse(bpm): 54 Weight(lbs): 125 Blood Pressure(mmHg): 150/87 Body Mass Index(BMI): 21 Temperature(F): 98.6 Respiratory 20 Rate(breaths/min): Photos: [1:No Photos] [2:No Photos] [N/A:N/A] Wound Location: [1:Right Lower Leg - Lateral Right Lower Leg - Medial N/A] Wounding Event: [1:Trauma] [2:Trauma] [N/A:N/A] Primary Etiology: [1:Venous Leg Ulcer] [2:Venous Leg Ulcer]  [N/A:N/A] Comorbid History: [1:Cataracts, Confinement Anxiety] [2:Cataracts, Confinement Anxiety] [N/A:N/A] Date Acquired: [1:02/27/2019] [2:02/17/2019] [N/A:N/A] Weeks of Treatment: [1:2] [2:2] [N/A:N/A] Wound Status: [1:Open] [2:Open] [N/A:N/A] Measurements L x W x D 2x2.5x0.1 [2:1x0.5x0.1] [N/A:N/A] (cm) Area (cm) : [1:3.927] [2:0.393] [N/A:N/A] Volume (cm) : [1:0.393] [2:0.039] [N/A:N/A] % Reduction in Area: [1:-32.30%] [2:-66.50%] [N/A:N/A] % Reduction in Volume: -32.30% [2:-62.50%] [N/A:N/A] Classification: [1:Full Thickness Without Exposed Support Structures Exposed Support Structures] [2:Full Thickness Without] [N/A:N/A] Exudate Amount: [1:Medium] [2:Small] [N/A:N/A] Exudate Type: [1:Serosanguineous] [2:Serosanguineous] [N/A:N/A] Exudate Color: [1:red, brown] [2:red, brown] [N/A:N/A] Wound Margin: [1:Flat and Intact] [2:Flat and Intact] [N/A:N/A] Granulation Amount: [1:Large (67-100%)] [2:None Present (0%)] [N/A:N/A] Granulation Quality: [1:Red] [2:N/A] [N/A:N/A] Necrotic Amount: [1:Small (1-33%)] [2:Large (67-100%)] [N/A:N/A] Necrotic Tissue: [1:Adherent Slough] [2:Eschar] [N/A:N/A] Exposed Structures: [1:Fat Layer (Subcutaneous Fat Layer (Subcutaneous N/A Tissue) Exposed: Yes Fascia: No Tendon: No Muscle: No Joint: No Bone: No] [2:Tissue) Exposed: Yes Fascia: No Tendon: No Muscle: No Joint: No Bone: No] Epithelialization: [1:Small (1-33%)] [2:Small (1-33%)] [N/A:N/A] Debridement: [1:N/A] [2:Debridement - Selective/Open Wound] [N/A:N/A] Pre-procedure [1:N/A] [2:11:50] [N/A:N/A] Verification/Time Out Taken: Pain Control: [1:N/A] [2:Lidocaine 4% Topical Solution] [N/A:N/A] Tissue Debrided: [1:N/A] [2:Necrotic/Eschar] [N/A:N/A] Level: [1:N/A] [2:Non-Viable Tissue] [N/A:N/A] Debridement Area (sq cm):N/A [2:0.5] [N/A:N/A] Instrument: [1:N/A] [2:Curette] [N/A:N/A] Bleeding: [1:N/A] [2:Minimum] [N/A:N/A] Hemostasis Achieved: [1:N/A] [2:Pressure] [N/A:N/A] Procedural Pain:  [1:N/A] [2:0] [N/A:N/A] Post Procedural Pain: [1:N/A] [2:0] [N/A:N/A] Debridement Treatment [1:N/A] [2:Procedure was tolerated] [N/A:N/A] Response: [2:well] Post Debridement [1:N/A] [2:1x0.5x0.1] [N/A:N/A] Measurements L x W x D (cm) Post Debridement [1:N/A] [2:0.039] [N/A:N/A] Volume: (cm) Procedures Performed: [1:Compression Therapy] [2:Compression Therapy Debridement] [N/A:N/A] Treatment Notes Wound #1 (Right, Lateral Lower Leg) 2. Periwound Care Moisturizing lotion 3. Primary Dressing Applied Polymem Ag 4. Secondary Dressing Dry Gauze 6. Support Layer Applied 3 layer compression wrap Wound #2 (Right, Medial Lower Leg) 2. Periwound Care Moisturizing lotion 3. Primary Dressing Applied Polymem Ag 4. Secondary Dressing Dry Gauze 6. Support Layer Applied 3 layer compression wrap Electronic Signature(s) Signed: 05/09/2019 6:00:33 PM By: Deon Pilling Signed: 05/11/2019 7:03:15 AM By: Linton Ham MD Entered By: Linton Ham on 05/09/2019 12:39:57 -------------------------------------------------------------------------------- Multi-Disciplinary Care Plan Details Patient Name: Date of Service: [name], [name] 05/09/2019 10:15 AM Medical Record [id number] Patient Account Number: [id number] Date of Birth/Sex: Treating RN: Jun 08, 1934 (85 y.o. [name], [name] Primary Care Chrystine Frogge: [name] Other Clinician: Referring Rabia Argote: Treating Emera Bussie/Extender:Robson, Vista Lawman, Moses Manners in Treatment: 2 Active Inactive Pain, Acute or Chronic Nursing Diagnoses: Pain, acute or chronic: actual or potential Goals: Patient/caregiver will verbalize adequate pain control between visits Date Initiated: 04/19/2019 Target Resolution Date: 05/31/2019 Goal Status: Active Interventions: Provide education on pain management Notes: Wound/Skin Impairment Nursing Diagnoses: Impaired tissue integrity Goals: Ulcer/skin breakdown will have a volume reduction of 30% by  week 4 Date Initiated: 04/19/2019 Target Resolution Date: 05/24/2019 Goal Status: Active Interventions: Provide education  on ulcer and skin care Notes: Electronic Signature(s) Signed: 05/09/2019 6:00:33 PM By: Deon Pilling Entered By: Deon Pilling on 05/09/2019 10:21:59 -------------------------------------------------------------------------------- Pain Assessment Details Patient Name: Date of Service: [name], [name] 05/09/2019 10:15 AM Medical Record [id number] Patient Account Number: [id number] Date of Birth/Sex: Treating RN: 07-Jun-1934 (85 y.o. [name], [name] Primary Care [name] Denne: [name] Other Clinician: Referring Averly Ericson: Treating Liandra Mendia/Extender:Robson, Vista Lawman, Moses Manners in Treatment: 2 Active Problems Location of Pain Severity and Description of Pain Patient Has Paino No Site Locations Pain Management and Medication Current Pain Management: Electronic Signature(s) Signed: 05/09/2019 6:00:33 PM By: Deon Pilling Signed: 06/12/2019 9:21:08 AM By: Sandre Kitty Entered By: Sandre Kitty on 05/09/2019 11:03:36 -------------------------------------------------------------------------------- Patient/Caregiver Education Details Patient Name: Date of Service: [name] 3/11/2021andnbsp10:15 AM Medical Record [id number] Patient Account Number: [id number] Date of Birth/Gender: Treating RN: 1934-12-27 (85 y.o. Debby Bud Primary Care Physician: [name] Other Clinician: Referring Physician: Treating Physician/Extender:Robson, Vista Lawman, Moses Manners in Treatment: 2 Education Assessment Education Provided To: Patient Education Topics Provided Wound/Skin Impairment: Handouts: Skin Care Do's and Dont's Methods: Explain/Verbal Responses: Reinforcements needed Electronic Signature(s) Signed: 05/09/2019 6:00:33 PM By: Deon Pilling Entered By: Deon Pilling on 05/09/2019  10:22:17 -------------------------------------------------------------------------------- Wound Assessment Details Patient Name: Date of Service: [name], [name] 05/09/2019 10:15 AM Medical Record [id number] Patient Account Number: [id number] Date of Birth/Sex: Treating RN: [birth year] (85 y.o. [name], [name] Primary Care Briston Lax: [name] Other Clinician: Referring Cierria Height: Treating Risha Barretta/Extender:Robson, Vista Lawman, Moses Manners in Treatment: 2 Wound Status Wound Number: 1 Primary Etiology: Venous Leg Ulcer Wound Location: Right Lower Leg - Lateral Wound Status: Open Wounding Event: Trauma Comorbid History: Cataracts, Confinement Anxiety Date Acquired: 02/27/2019 Weeks Of Treatment: 2 Clustered Wound: No Photos Wound Measurements Length: (cm) 2 % Reduc Width: (cm) 2.5 % Reduc Depth: (cm) 0.1 Epithel Area: (cm) 3.927 Tunnel Volume: (cm) 0.393 Underm Wound Description Classification: Full Thickness Without Exposed Support Foul O Structures Slough Wound Flat and Intact Margin: Exudate Medium Amount: Exudate Serosanguineous Type: Exudate red, brown Color: Wound Bed Granulation Amount: Large (67-100%) Granulation Quality: Red Fascia Necrotic Amount: Small (1-33%) Fat La Necrotic Quality: Adherent Slough Tendon Muscle Joint Bone E Electronic Signature(s) Signed: 05/10/2019 4:47:30 PM By: Mikeal Hawthorne EMT/HBOT Signed: 05/10/2019 5:45:00 PM By: Deon Pilling Previous Signature: 05/09/2019 6:00:33 PM Version By: Lady Deutscher Entered By: Mikeal Hawthorne on 03/12 dor After Cleansing: No Lynita Lombard Yes Exposed Structure Exposed: No yer (Subcutaneous Tissue) Exposed: Yes Exposed: No Exposed: No Exposed: No xposed: No i /2021 11:20:40 tion in Area: -32.3% tion in Volume: -32.3% ialization: Small (1-33%) ing: No ining: No -------------------------------------------------------------------------------- Wound Assessment Details Patient  Name: Date of Service: [name], [name] 05/09/2019 10:15 AM Medical Record [id number] Patient Account Number: [id number] Date of Birth/Sex: Treating RN: 1934/03/18 (85 y.o. [name], [name] Primary Care Shantrell Placzek: [name] Other Clinician: Referring Dallen Bunte: Treating Nyeema Want/Extender:Robson, Vista Lawman, Moses Manners in Treatment: 2 Wound Status Wound Number: 2 Primary Etiology: Venous Leg Ulcer Wound Location: Right Lower Leg - Medial Wound Status: Open Wounding Event: Trauma Comorbid History: Cataracts, Confinement Anxiety Date Acquired: 02/17/2019 Weeks Of Treatment: 2 Clustered Wound: No Photos Wound Measurements Length: (cm) 1 % Reduc Width: (cm) 0.5 % Reduc Depth: (cm) 0.1 Epithel Area: (cm) 0.393 Tunnel Volume: (cm) 0.039 Underm Wound Description Full Thickness Without Exposed Support Foul O Classification: Structures Slough Wound Flat and Intact Margin: Exudate Small Amount: Exudate Serosanguineous Type: Exudate red, brown Color: Wound Bed Granulation Amount: None Present (0%) Necrotic Amount: Large (67-100%)  Fascia Ex Necrotic Quality: Eschar Fat Layer Tendon Ex Muscle Ex Joint Exp Bone Expo dor After Cleansing: No /Fibrino Yes Exposed Structure posed: No (Subcutaneous Tissue) Exposed: Yes posed: No posed: No osed: No sed: No tion in Area: -66.5% tion in Volume: -62.5% ialization: Small (1-33%) ing: No ining: No Electronic Signature(s) Signed: 05/10/2019 4:47:30 PM By: Mikeal Hawthorne EMT/HBOT Signed: 05/10/2019 5:45:00 PM By: Deon Pilling Previous Signature: 05/09/2019 6:00:33 PM Version By: Deon Pilling Entered By: Mikeal Hawthorne on 05/10/2019 11:21:05 -------------------------------------------------------------------------------- Vitals Details Patient Name: Date of Service: [name]. 05/09/2019 10:15 AM Medical Record [id number] Patient Account Number: [id number] Date of Birth/Sex: Treating RN: [birth year]  (85 y.o. [name], [name] Primary Care Vicki Pasqual: [name] Other Clinician: Referring Maeve Debord: Treating Riannah Stagner/Extender:Robson, Vista Lawman, Moses Manners in Treatment: 2 Vital Signs Time Taken: 11:01 Temperature (F): 98.6 Height (in): 64 Pulse (bpm): 91 Weight (lbs): 125 Respiratory Rate (breaths/min): 20 Body Mass Index (BMI): 21.5 Blood Pressure (mmHg): 150/87 Reference Range: 80 - 120 mg / dl Electronic Signature(s) Signed: 06/12/2019 9:21:08 AM By: Sandre Kitty Entered By: Sandre Kitty on 05/09/2019 11:03:26

## 2019-06-13 DIAGNOSIS — J849 Interstitial pulmonary disease, unspecified: Secondary | ICD-10-CM | POA: Diagnosis not present

## 2019-06-14 ENCOUNTER — Encounter (HOSPITAL_BASED_OUTPATIENT_CLINIC_OR_DEPARTMENT_OTHER): Payer: Medicare HMO | Admitting: Internal Medicine

## 2019-06-14 ENCOUNTER — Other Ambulatory Visit: Payer: Self-pay

## 2019-06-14 DIAGNOSIS — I87311 Chronic venous hypertension (idiopathic) with ulcer of right lower extremity: Secondary | ICD-10-CM | POA: Diagnosis not present

## 2019-06-14 DIAGNOSIS — I872 Venous insufficiency (chronic) (peripheral): Secondary | ICD-10-CM | POA: Diagnosis not present

## 2019-06-14 DIAGNOSIS — Z7952 Long term (current) use of systemic steroids: Secondary | ICD-10-CM | POA: Diagnosis not present

## 2019-06-14 DIAGNOSIS — E785 Hyperlipidemia, unspecified: Secondary | ICD-10-CM | POA: Diagnosis not present

## 2019-06-14 DIAGNOSIS — L97812 Non-pressure chronic ulcer of other part of right lower leg with fat layer exposed: Secondary | ICD-10-CM | POA: Diagnosis not present

## 2019-06-14 DIAGNOSIS — J849 Interstitial pulmonary disease, unspecified: Secondary | ICD-10-CM | POA: Diagnosis not present

## 2019-06-14 DIAGNOSIS — Z85828 Personal history of other malignant neoplasm of skin: Secondary | ICD-10-CM | POA: Diagnosis not present

## 2019-06-14 DIAGNOSIS — W228XXD Striking against or struck by other objects, subsequent encounter: Secondary | ICD-10-CM | POA: Diagnosis not present

## 2019-06-14 DIAGNOSIS — S81811D Laceration without foreign body, right lower leg, subsequent encounter: Secondary | ICD-10-CM | POA: Diagnosis not present

## 2019-06-14 NOTE — Telephone Encounter (Signed)
Called and spoke with pt letting her know the info stated by MR. Pt stated she would contact [facility] to get them to send [place] a disc of images.   There is a recall that was placed for pt's f/u. Nothing further needed.

## 2019-06-15 NOTE — Progress Notes (Signed)
Terry, Conway ([id number]) Visit Report for 06/14/2019 Cellular or Tissue Based Product Details Patient Name: Date of Service: [name], [name] 06/14/2019 10:30 AM Medical Record [id number] Patient Account Number: [id number] Date of Birth/Sex: Treating RN: March 09, 1934 (85 y.o. [name] Primary Care Provider: [name] Other Clinician: Referring Provider: Treating Provider/Extender:Dovie Kapusta, Vista Lawman, Moses Manners in Treatment: 8 Cellular or Tissue Based Wound #2 Right,Medial Lower Leg Product Type Applied to: Performed By: Physician Ricard Dillon., MD Cellular or Tissue Based Apligraf Product Type: Level of Consciousness (Pre- Awake and Alert procedure): Pre-procedure Yes - 11:30 Verification/Time Out Taken: Location: trunk / arms / legs Wound Size (sq cm): 0.32 Product Size (sq cm): 22 Waste Size (sq cm): 0 Waste Reason: size of wound Amount of Product Applied (sq cm): 22 Instrument Used: Blade, Forceps, Scissors Lot #: [id number] Order #: 2 Expiration Date: 06/27/2019 Fenestrated: Yes Instrument: Blade Reconstituted: Yes Solution Type: normal saline Solution Amount: 85m Lot #:: [id number] Expiration Date: 01/27/2021 Secured: Yes Secured With: Steri-Strips Dressing Applied: Yes Primary Dressing: adaptic Procedural Pain: 0 Post Procedural Pain: 0 Response to Treatment: Procedure was tolerated well Level of Consciousness Awake and Alert (Post-procedure): Post Procedure Diagnosis Same as Pre-procedure Electronic Signature(s) Signed: 06/15/2019 6:55:41 AM By: RLinton HamMD Entered By: RLinton Hamon 06/14/2019 12:01:03 -------------------------------------------------------------------------------- Cellular or Tissue Based Product Details Patient Name: Date of Service: [name] 06/14/2019 10:30 AM Medical Record [id number] Account Number: [id number] of Birth/Sex: Treating RN: [id number](85y.o.  FClearnce SorrelPrimary Care Provider: GLavone OrnOther Clinician: Referring Provider: Treating Provider/Extender:Malani Lees, MVista Lawman JMoses Mannersin Treatment: 8 Cellular or Tissue Based Wound #1 Right,Lateral Lower Leg Product Type Applied to: Performed By: Physician RRicard Dillon, MD Cellular or Tissue Based Apligraf Product Type: Level of Consciousness (Pre- Awake and Alert procedure): Pre-procedure Yes - 11:30 Verification/Time Out Taken: Location: trunk / arms / legs Wound Size (sq cm): 2.25 Product Size (sq cm): 22 Waste Size (sq cm): 0 Waste Reason: size of wounds Amount of Product Applied (sq cm): 22 Instrument Used: Blade, Forceps, Scissors Lot #: [id number] Order #: 2 Expiration Date: 06/27/2019 Fenestrated: Yes Instrument: Blade Reconstituted: Yes Solution Type: normal saline Solution Amount: 168mLot #: : [id number] Expiration Date: 01/27/2021 Secured: Yes Secured With: Steri-Strips Dressing Applied: Yes Primary Dressing: adaptic Procedural Pain: 0 Post Procedural Pain: 0 Response to Treatment: Procedure was tolerated well Level of Consciousness Awake and Alert (Post-procedure): Post Procedure Diagnosis Same as Pre-procedure Electronic Signature(s) Signed: 06/15/2019 6:55:41 AM By: RoLinton HamD Entered By: RoLinton Hamn 06/14/2019 12:01:21 -------------------------------------------------------------------------------- HPI Details Patient Name: Date of Service: Terry Lorenzo4/16/2021 10:30 AM Medical Record [id number] Account Number: [id number] of Birth/Sex: Treating RN: 05/1934-11-178352.o. F)Clearnce Sorrelrimary Care Provider: GrLavone Ornther Clinician: Referring Provider: Treating Provider/Extender:Yanette Tripoli, MiVista LawmanJoMoses Mannersn Treatment: 8 History of Present Illness HPI Description: ADMISSION 04/19/2019 This is an 89ear old woman who has chronic interstitial lung  disease on prednisone 10 mg a day. Towards the end of December she hit her lateral right leg on a chair. She was seen in urgent care and the area was Steri-Stripped however this did not hold together. Sometime after this traumatized the medial part of her right leg. She has some chronic edema in her lower legs probably is a secondary phenomenon to chronic venous insufficiency. She also takes chronic steroids. He [place] her [place] who [place]  doxy[place] on 2/8 I believe she has completed that. She is here for review of nonhealing areas on the right lateral greater than right medial calf Past medical history; includes interstitial lung disease on prednisone 10 mg and is just started Ofev. She has a history of basal cell skin cancer, hyperlipidemia. ABI in our clinic was 1.04 in the right 2/26; patient with chronic venous insufficiency and interstitial lung disease on chronic prednisone. She has a wound on her right lateral and right medial lower leg on the right which were separate incidents of trauma. These wounds started in December. She had very significant debris on both of the wounds last week which required debridement they require debridement again today. We have been using Iodoflex 3/4; the patient has two wounds on her right lower leg one laterally and one medially in the setting of chronic venous hypertension and resultant separate incidences of trauma. The wounds actually look better in terms of surface. We have been using Iodoflex, changed to [facility] classic today 3/11; she complained of pain almost like lancinating pain in the wound on the right lateral leg. This was new this week and she attributed this to the fact that we changed her to [facility]. Previously everything was well tolerated including at least 2 weeks of Sorbact. She has a $161 per application co-pay for Apligraf. She expressed the fact that it they would be able to  pay this if it was felt to be necessary 3/19; patient is complaining about the facility charge coming here. We listened to this for a while. We put polymen on this last week after she experience pain with Hydrofera Blue she seems to tolerate this better. She has an area on the right lateral and right medial leg in the setting of chronic venous insufficiency and chronic prednisone use. Although her overall surface area is not any better the wound surface certainly looks a lot better 3/26; patient has 2 traumatic areas in the setting of chronic venous insufficiency. One is on the right lateral and a separate incidence on the right medial lower extremity. These are not that large and the surface of them looks adequate however very slow to come down in size. 05/31/2019 upon evaluation today patient appears to be doing decently well in regard to her wounds today. Both are measuring slightly smaller which is good news. Fortunately there is no signs of active infection at this time. Subsequently I do believe that she is a candidate for proceeding with the Apligraf at this point we did get approval and she does want to proceed as such. Fortunately there is no signs of active infection at this time. 4/16; patient arrives in clinic today for application of Apligraf #2. The patient was initially very upset and angry about the slow progress of her wound healing and the effect this is having on her life, her lung function [idiopathic pulmonary fibrosiso] And has caused a deterioration in her pulmonary function test. Although I certainly was willing to listen to the effect is having on her life and her quality of life I told her that I did not think this was causing a deterioration in her pulmonary function tests. She follows with the pulmonary clinic at [facility] and at 1 point in this discussion I did offer to refer her for consultation at the [facility] wound care clinic which she declined. Electronic  Signature(s) Signed: 06/15/2019 6:55:41 AM By: Linton Ham MD Entered By: Linton Ham on 06/14/2019 12:04:18 -------------------------------------------------------------------------------- Physical Exam Details  Patient Name: Date of Service: [name], [name] 06/14/2019 10:30 AM Medical Record [id number] Patient Account Number: [id number] Date of Birth/Sex: Treating RN: [birth year] (85 y.o. [name] Primary Care Provider: [name] Other Clinician: Referring Provider: Treating Provider/Extender:Rye Decoste, Vista Lawman, Moses Manners in Treatment: 8 Constitutional Sitting or standing Blood Pressure is within target range for patient.. Pulse regular and within target range for patient.[name] Respirations regular, non-labored and within target range.. Temperature is normal and within the target range for the patient.[name] Appears in no distress. Notes Wound exam;; no debridement is required. Under illumination the wound beds look satisfactory although certainly not vibrant granulation. Applied Apligraf #2 in the standard fashion. Edema control is good. Pedal pulses are palpable Electronic Signature(s) Signed: 06/15/2019 6:55:41 AM By: Linton Ham MD Entered By: Linton Ham on 06/14/2019 12:14:11 -------------------------------------------------------------------------------- Physician Orders Details Patient Name: Date of Service: Mammie Conway. 06/14/2019 10:30 AM Medical Record [id number] Patient Account Number: [id number] Date of Birth/Sex: Treating RN: Jul 07, 1934 (85 y.o. [name], [name] Primary Care Provider: [name] Other Clinician: Referring Provider: Treating Provider/Extender:Tremane Spurgeon, Vista Lawman, Moses Manners in Treatment: 8 Verbal / Phone Orders: No Diagnosis Coding ICD-10 Coding Code Description I87.311 Chronic venous hypertension (idiopathic) with ulcer of right lower extremity L97.812 Non-pressure chronic ulcer of other part  of right lower leg with fat layer exposed S81.811D Laceration without foreign body, right lower leg, subsequent encounter Z92.241 Personal history of systemic steroid therapy Follow-up Appointments Return Appointment in 2 weeks. - MD visit Nurse Visit: - Next week Dressing Change Frequency Wound #1 Right,Lateral Lower Leg Do not change entire dressing for one week. Skin Barriers/Peri-Wound Care Barrier cream - to periowund Moisturizing lotion Wound Cleansing May shower with protection. - use a cast protector. can get at [facility] or [facility] Primary Wound Dressing Wound #1 Right,Lateral Lower Leg Skin Substitute Application - Apligraft #2 Wound #2 Right,Medial Lower Leg Skin Substitute Application - Apligraft #2 Secondary Dressing Wound #1 Right,Lateral Lower Leg ABD pad Other: - adaptic and steri-strips Wound #2 Right,Medial Lower Leg ABD pad Other: - adaptic and steri-strips Edema Control 3 Layer Compression System - Right Lower Extremity Avoid standing for long periods of time Elevate legs to the level of the heart or above for 30 minutes daily and/or when sitting, a frequency of: Exercise regularly Electronic Signature(s) Signed: 06/14/2019 4:21:34 PM By: Kela Millin Signed: 06/15/2019 6:55:41 AM By: Linton Ham MD Entered By: Kela Millin on 06/14/2019 11:15:20 -------------------------------------------------------------------------------- Problem List Details Patient Name: Date of Service: Mammie Conway. 06/14/2019 10:30 AM Medical Record [id number] Patient Account Number: [id number] Date of Birth/Sex: Treating RN: May 13, 1934 (85 y.o. [name] Primary Care Provider: [name] Other Clinician: Referring Provider: Treating Provider/Extender:Sheamus Hasting, Vista Lawman, Moses Manners in Treatment: 8 Active Problems ICD-10 Evaluated Encounter Code Description Active Date Today Diagnosis I87.311 Chronic venous hypertension (idiopathic) with  ulcer of 04/19/2019 No Yes right lower extremity L97.812 Non-pressure chronic ulcer of other part of right lower 04/19/2019 No Yes leg with fat layer exposed S81.811D Laceration without foreign body, right lower leg, 04/19/2019 No Yes subsequent encounter Z92.241 Personal history of systemic steroid therapy 04/19/2019 No Yes Inactive Problems Resolved Problems Electronic Signature(s) Signed: 06/15/2019 6:55:41 AM By: Linton Ham MD Entered By: Linton Ham on 06/14/2019 12:00:25 -------------------------------------------------------------------------------- Progress Note Details Patient Name: Date of Service: Mammie Conway. 06/14/2019 10:30 AM Medical Record [id number] Patient Account Number: [id number] Date of Birth/Sex: Treating RN: 11/29/1934 (85 y.o. [name] Primary Care Provider: [name] Other Clinician: Referring Provider: Treating  Provider/Extender:Edit Ricciardelli, Vista Lawman, John Weeks in Treatment: 8 Subjective History of Present Illness (HPI) ADMISSION 04/19/2019 This is an 85 year old woman who has chronic interstitial lung disease on prednisone 10 mg a day. Towards the end of December she hit her lateral right leg on a chair. She was seen in urgent care and the area was Steri-Stripped however this did not hold together. Sometime after this traumatized the medial part of her right leg. She has some chronic edema in her lower legs probably is a secondary phenomenon to chronic venous insufficiency. She also takes chronic steroids. He saw her primary physician who is Dr. [name] at [place] and was given doxycycline on 2/8 I believe she has completed that. She is here for review of nonhealing areas on the right lateral greater than right medial calf Past medical history; includes interstitial lung disease on prednisone 10 mg and is just started Ofev. She has a history of basal cell skin cancer, hyperlipidemia. ABI in our clinic was 1.04 in the  right 2/26; patient with chronic venous insufficiency and interstitial lung disease on chronic prednisone. She has a wound on her right lateral and right medial lower leg on the right which were separate incidents of trauma. These wounds started in December. She had very significant debris on both of the wounds last week which required debridement they require debridement again today. We have been using Iodoflex 3/4; the patient has two wounds on her right lower leg one laterally and one medially in the setting of chronic venous hypertension and resultant separate incidences of trauma. The wounds actually look better in terms of surface. We have been using Iodoflex, changed to [facility] classic today 3/11; she complained of pain almost like lancinating pain in the wound on the right lateral leg. This was new this week and she attributed this to the fact that we changed her to [facility]. Previously everything was well tolerated including at least 2 weeks of Sorbact. She has a $425 per application co-pay for Apligraf. She expressed the fact that it they would be able to pay this if it was felt to be necessary 3/19; patient is complaining about the facility charge coming here. We listened to this for a while. We put polymen on this last week after she experience pain with Hydrofera Blue she seems to tolerate this better. She has an area on the right lateral and right medial leg in the setting of chronic venous insufficiency and chronic prednisone use. Although her overall surface area is not any better the wound surface certainly looks a lot better 3/26; patient has 2 traumatic areas in the setting of chronic venous insufficiency. One is on the right lateral and a separate incidence on the right medial lower extremity. These are not that large and the surface of them looks adequate however very slow to come down in size. 05/31/2019 upon evaluation today patient appears to be doing decently  well in regard to her wounds today. Both are measuring slightly smaller which is good news. Fortunately there is no signs of active infection at this time. Subsequently I do believe that she is a candidate for proceeding with the Apligraf at this point we did get approval and she does want to proceed as such. Fortunately there is no signs of active infection at this time. 4/16; patient arrives in clinic today for application of Apligraf #2. The patient was initially very upset and angry about the slow progress of her wound healing and the effect  this is having on her life, her lung function [idiopathic pulmonary fibrosiso] And has caused a deterioration in her pulmonary function test. Although I certainly was willing to listen to the effect is having on her life and her quality of life I told her that I did not think this was causing a deterioration in her pulmonary function tests. She follows with the pulmonary clinic at [facility] and at 1 point in this discussion I did offer to refer her for consultation at the [facility] wound care clinic which she declined. Objective Constitutional Sitting or standing Blood Pressure is within target range for patient.. Pulse regular and within target range for patient.[name] Respirations regular, non-labored and within target range.. Temperature is normal and within the target range for the patient.[name] Appears in no distress. Vitals Time Taken: 10:35 AM, Height: 64 in, Weight: 125 lbs, BMI: 21.5, Temperature: 97.7 F, Pulse: 87 bpm, Respiratory Rate: 18 breaths/min, Blood Pressure: 139/88 mmHg. General Notes: Wound exam;; no debridement is required. Under illumination the wound beds look satisfactory although certainly not vibrant granulation. Applied Apligraf #2 in the standard fashion. Edema control is good. Pedal pulses are palpable Integumentary (Hair, Skin) Wound #1 status is Open. Original cause of wound was Trauma. The wound is located on the Right,Lateral  Lower Leg. The wound measures 1.5cm length x 1.5cm width x 0.1cm depth; 1.767cm^2 area and 0.177cm^3 volume. There is Fat Layer (Subcutaneous Tissue) Exposed exposed. There is no tunneling or undermining noted. There is a medium amount of serosanguineous drainage noted. The wound margin is flat and intact. There is large (67-100%) red granulation within the wound bed. There is a small (1-33%) amount of necrotic tissue within the wound bed including Adherent Slough. Wound #2 status is Open. Original cause of wound was Trauma. The wound is located on the Right,Medial Lower Leg. The wound measures 0.8cm length x 0.4cm width x 0.2cm depth; 0.251cm^2 area and 0.05cm^3 volume. There is Fat Layer (Subcutaneous Tissue) Exposed exposed. There is no tunneling or undermining noted. There is a small amount of serosanguineous drainage noted. The wound margin is flat and intact. There is large (67-100%) red granulation within the wound bed. There is no necrotic tissue within the wound bed. Assessment Active Problems ICD-10 Chronic venous hypertension (idiopathic) with ulcer of right lower extremity Non-pressure chronic ulcer of other part of right lower leg with fat layer exposed Laceration without foreign body, right lower leg, subsequent encounter Personal history of systemic steroid therapy Procedures Wound #1 Pre-procedure diagnosis of Wound #1 is a Venous Leg Ulcer located on the Right,Lateral Lower Leg. A skin graft procedure using a bioengineered skin substitute/cellular or tissue based product was performed by Ricard Dillon., MD with the following instrument(s): Blade, Forceps, and Scissors. Apligraf was applied and secured with Steri- Strips. 22 sq cm of product was utilized and 0 sq cm was wasted due to size of wounds. Post Application, adaptic was applied. A Time Out was conducted at 11:30, prior to the start of the procedure. The procedure was tolerated well with a pain level of 0  throughout and a pain level of 0 following the procedure. Post procedure Diagnosis Wound #1: Same as Pre-Procedure . Pre-procedure diagnosis of Wound #1 is a Venous Leg Ulcer located on the Right,Lateral Lower Leg . There was a Three Layer Compression Therapy Procedure by Deon Pilling, RN. Post procedure Diagnosis Wound #1: Same as Pre-Procedure Wound #2 Pre-procedure diagnosis of Wound #2 is a Venous Leg Ulcer located on the Right,Medial Lower  Leg. A skin graft procedure using a bioengineered skin substitute/cellular or tissue based product was performed by Ricard Dillon., MD with the following instrument(s): Blade, Forceps, and Scissors. Apligraf was applied and secured with Steri- Strips. 22 sq cm of product was utilized and 0 sq cm was wasted due to size of wound. Post Application, adaptic was applied. A Time Out was conducted at 11:30, prior to the start of the procedure. The procedure was tolerated well with a pain level of 0 throughout and a pain level of 0 following the procedure. Post procedure Diagnosis Wound #2: Same as Pre-Procedure . Pre-procedure diagnosis of Wound #2 is a Venous Leg Ulcer located on the Right,Medial Lower Leg . There was a Three Layer Compression Therapy Procedure by Deon Pilling, RN. Post procedure Diagnosis Wound #2: Same as Pre-Procedure Plan Follow-up Appointments: Return Appointment in 2 weeks. - MD visit Nurse Visit: - Next week Dressing Change Frequency: Wound #1 Right,Lateral Lower Leg: Do not change entire dressing for one week. Skin Barriers/Peri-Wound Care: Barrier cream - to periowund Moisturizing lotion Wound Cleansing: May shower with protection. - use a cast protector. can get at [facility] or [facility] Primary Wound Dressing: Wound #1 Right,Lateral Lower Leg: Skin Substitute Application - Apligraft #2 Wound #2 Right,Medial Lower Leg: Skin Substitute Application - Apligraft #2 Secondary Dressing: Wound #1 Right,Lateral Lower Leg: ABD  pad Other: - adaptic and steri-strips Wound #2 Right,Medial Lower Leg: ABD pad Other: - adaptic and steri-strips Edema Control: 3 Layer Compression System - Right Lower Extremity Avoid standing for long periods of time Elevate legs to the level of the heart or above for 30 minutes daily and/or when sitting, a frequency of: Exercise regularly 1. Apligraf #2 these were traumatic wounds in the setting of chronic venous insufficiency and chronic steroid use 2. So far no major change. I told her that one application is certainly not enough to be certain of treatment failure that we need to do wraps tomorrow to have a sense of this 3. The patient was initially very upset with [place] about the lack of progress here. I tried to talk her through this however she was fixated on how it is affected her quality of life, however I was not willing to accept the fact that it is caused a reduction for deterioration in her pulmonary function test Electronic Signature(s) Signed: 06/15/2019 6:55:41 AM By: Linton Ham MD Entered By: Linton Ham on 06/14/2019 12:15:58 -------------------------------------------------------------------------------- SuperBill Details Patient Name: Date of Service: Mammie Conway 06/14/2019 Medical Record [id number] Patient Account Number: [id number] Date of Birth/Sex: Treating RN: May 01, 1934 (85 y.o. [name] Primary Care Provider: [name] Other Clinician: Referring Provider: Treating Provider/Extender:Mckenzee Beem, Vista Lawman, Moses Manners in Treatment: 8 Diagnosis Coding ICD-10 Codes Code Description I87.311 Chronic venous hypertension (idiopathic) with ulcer of right lower extremity L97.812 Non-pressure chronic ulcer of other part of right lower leg with fat layer exposed S81.811D Laceration without foreign body, right lower leg, subsequent encounter Z92.241 Personal history of systemic steroid therapy Facility Procedures CPT4 Code  Description: [id number] (Facility Use Only) Apligraf 1 SQ CM Modifier: Quantity: 42 CPT4 Code Description: [id number] - SKIN SUB GRAFT TRNK/ARM/LEG ICD-10 Diagnosis Description I87.311 Chronic venous hypertension (idiopathic) with ulcer of right L97.812 Non-pressure chronic ulcer of other part of right lower leg Modifier: lower extre with fat lay Quantity: 1 mity er exposed Physician Procedures CPT4 Code Description: [id number] - WC PHYS SKIN SUB GRAFT TRNK/ARM/LEG ICD-10 Diagnosis Description I87.311 Chronic venous hypertension (idiopathic) with ulcer  of right L97.812 Non-pressure chronic ulcer of other part of right lower leg w Modifier: lower extre ith fat lay Quantity: 1 mity er exposed Electronic Signature(s) Signed: 06/15/2019 6:55:41 AM By: Linton Ham MD Entered By: Linton Ham on 06/14/2019 12:16:58

## 2019-06-18 NOTE — Progress Notes (Signed)
[name], [name] ([id number]) Visit Report for 06/14/2019 Arrival Information Details Patient Name: Date of Service: [name], [name] 06/14/2019 10:30 AM Medical Record [id number] Patient Account Number: [id number] Date of Birth/Sex: Treating RN: 12-18-1934 (85 y.o. [name] Primary Care Makayle Krahn: [name] Other Clinician: Referring Evelio Rueda: Treating Florenda Watt/Extender:Robson, Vista Lawman, Moses Manners in Treatment: 8 Visit Information History Since Last Visit Added or deleted any medications: No Patient Arrived: Ambulatory Any new allergies or adverse reactions: No Arrival Time: 10:33 Had a fall or experienced change in No Accompanied By: self activities of daily living that may affect Transfer Assistance: None risk of falls: Patient Identification Verified: Yes Signs or symptoms of abuse/neglect since last No Secondary Verification Process Completed: Yes visito Patient Requires Transmission-Based No Hospitalized since last visit: No Precautions: Implantable device outside of the clinic excluding No Patient Has Alerts: No cellular tissue based products placed in the center since last visit: Has Dressing in Place as Prescribed: Yes Pain Present Now: No Electronic Signature(s) Signed: 06/18/2019 1:29:28 PM By: Sandre Kitty Entered By: Sandre Kitty on 06/14/2019 10:33:32 -------------------------------------------------------------------------------- Compression Therapy Details Patient Name: Date of Service: [name] 06/14/2019 10:30 AM Medical Record [id number] Patient Account Number: [id number] Date of Birth/Sex: Treating RN: Aug 21, 1934 (85 y.o. [name] Primary Care Secundino Ellithorpe: [name] Other Clinician: Referring [name]: Treating Theressa Piedra/Extender:Robson, Vista Lawman, Moses Manners in Treatment: 8 Compression Therapy Performed for Wound Wound #1 Right,Lateral Lower Leg Assessment: Performed By: Clinician  Deon Pilling, RN Compression Type: Three Layer Post Procedure Diagnosis Same as Pre-procedure Electronic Signature(s) Signed: 06/14/2019 4:21:34 PM By: Kela Millin Entered By: Kela Millin on 06/14/2019 11:27:42 -------------------------------------------------------------------------------- Compression Therapy Details Patient Name: Date of Service: [name], [name] 06/14/2019 10:30 AM Medical Record [id number] Patient Account Number: [id number] Date of Birth/Sex: Treating RN: 01-05-1935 (85 y.o. [name] Primary Care Shauni Henner: [name] Other Clinician: Referring [name]: Treating Christan Ciccarelli/Extender:Robson, Vista Lawman, Moses Manners in Treatment: 8 Compression Therapy Performed for Wound Wound #2 Right,Medial Lower Leg Assessment: Performed By: Clinician Deon Pilling, RN Compression Type: Three Layer Post Procedure Diagnosis Same as Pre-procedure Electronic Signature(s) Signed: 06/14/2019 4:21:34 PM By: Kela Millin Entered By: Kela Millin on 06/14/2019 11:27:42 -------------------------------------------------------------------------------- Encounter Discharge Information Details Patient Name: Date of Service: [name]. 06/14/2019 10:30 AM Medical Record [id number] Patient Account Number: [id number] Date of Birth/Sex: Treating RN: 1934-05-12 (85 y.o. [name] Primary Care [name]: [name] Other Clinician: Referring [name]: Treating [name]/Extender:Robson, Vista Lawman, Moses Manners in Treatment: 8 Encounter Discharge Information Items Post Procedure Vitals Discharge Condition: Stable Temperature (F): 97.7 Ambulatory Status: Ambulatory Pulse (bpm): 87 Discharge Destination: Home Respiratory Rate (breaths/min): 18 Transportation: Private Auto Blood Pressure (mmHg): 139/88 Accompanied By: self Schedule Follow-up Appointment: Yes Clinical Summary of Care: Electronic Signature(s) Signed:  06/14/2019 4:06:47 PM By: Deon Pilling Entered By: Deon Pilling on 06/14/2019 11:58:31 -------------------------------------------------------------------------------- Lower Extremity Assessment Details Patient Name: Date of Service: [name], [name] 06/14/2019 10:30 AM Medical Record [id number] Patient Account Number: [id number] Date of Birth/Sex: Treating RN: [birth year] (85 y.o. [name] Primary Care [name]: [name] Other Clinician: Referring [name]: Treating [name]/Extender:Robson, Vista Lawman, Moses Manners in Treatment: 8 Edema Assessment Assessed: [Left: No] [Right: No] Edema: [Left: Ye] [Right: s] Calf Left: Right: Point of Measurement: cm From Medial Instep cm 30.6 cm Ankle Left: Right: Point of Measurement: cm From Medial Instep cm 21.8 cm Vascular Assessment Pulses: Dorsalis Pedis Palpable: [Right:Yes] Electronic Signature(s) Signed: 06/14/2019 4:17:05 PM By: Baruch Gouty RN, BSN Entered By: Baruch Gouty on  06/14/2019 10:50:05 -------------------------------------------------------------------------------- Multi Wound Chart Details Patient Name: Date of Service: Conway, Terry 06/14/2019 10:30 AM Medical Record [id number] Patient Account Number: [id number] Date of Birth/Sex: Treating RN: 03/30/1934 (85 y.o. [name] Primary Care [name]: [name] Other Clinician: Referring Shalamar Plourde: Treating Neveen Daponte/Extender:Robson, Vista Lawman, Moses Manners in Treatment: 8 Vital Signs Height(in): 64 Pulse(bpm): 70 Weight(lbs): 125 Blood Pressure(mmHg): 139/88 Body Mass Index(BMI): 21 Temperature(F): 97.7 Respiratory 18 Rate(breaths/min): Photos: [1:No Photos] [2:No Photos] [N/A:N/A] Wound Location: [1:Right, Lateral Lower Leg] [2:Right, Medial Lower Leg] [N/A:N/A] Wounding Event: [1:Trauma] [2:Trauma] [N/A:N/A] Primary Etiology: [1:Venous Leg Ulcer] [2:Venous Leg Ulcer] [N/A:N/A] Comorbid History:  [1:Cataracts, Confinement Anxiety] [2:Cataracts, Confinement Anxiety] [N/A:N/A] Date Acquired: [1:02/27/2019] [2:02/17/2019] [N/A:N/A] Weeks of Treatment: [1:8] [2:8] [N/A:N/A] Wound Status: [1:Open] [2:Open] [N/A:N/A] Measurements L x W x D 1.5x1.5x0.1 [2:0.8x0.4x0.2] [N/A:N/A] (cm) Area (cm) : [1:1.767] [2:0.251] [N/A:N/A] Volume (cm) : [1:0.177] [2:0.05] [N/A:N/A] % Reduction in Area: [1:40.50%] [2:-6.40%] [N/A:N/A] % Reduction in Volume: 40.40% [2:-108.30%] [N/A:N/A] Classification: [1:Full Thickness Without Exposed Support Structures Exposed Support Structures] [2:Full Thickness Without] [N/A:N/A] Exudate Amount: [1:Medium] [2:Small] [N/A:N/A] Exudate Type: [1:Serosanguineous] [2:Serosanguineous] [N/A:N/A] Exudate Color: [1:red, brown] [2:red, brown] [N/A:N/A] Wound Margin: [1:Flat and Intact] [2:Flat and Intact] [N/A:N/A] Granulation Amount: [1:Large (67-100%)] [2:Large (67-100%)] [N/A:N/A] Granulation Quality: [1:Red] [2:Red] [N/A:N/A] Necrotic Amount: [1:Small (1-33%)] [2:None Present (0%)] [N/A:N/A] Exposed Structures: [1:Fat Layer (Subcutaneous Fat Layer (Subcutaneous N/A Tissue) Exposed: Yes Fascia: No Tendon: No Muscle: No Joint: No Bone: No] [2:Tissue) Exposed: Yes Fascia: No Tendon: No Muscle: No Joint: No Bone: No] Epithelialization: [1:Small (1-33%)] [2:Small (1-33%)] [N/A:N/A] Procedures Performed: Cellular or Tissue Based Cellular or Tissue Based N/A [1:Product Compression Therapy] [2:Product Compression Therapy] Treatment Notes Wound #1 (Right, Lateral Lower Leg) 1. Cleanse With Wound Cleanser Soap and water 2. Periwound Care Barrier cream Moisturizing lotion 3. Primary Dressing Applied Cellular Based Tissue Product Other primary dressing (specifiy in notes) 4. Secondary Dressing ABD Pad 6. Support Layer Applied 3 layer compression wrap Notes primary dressing apligraft applied by MD. secured with adaptic and steri-strips. Wound #2 (Right, Medial Lower  Leg) 1. Cleanse With Wound Cleanser Soap and water 2. Periwound Care Barrier cream Moisturizing lotion 3. Primary Dressing Applied Cellular Based Tissue Product Other primary dressing (specifiy in notes) 4. Secondary Dressing ABD Pad 6. Support Layer Applied 3 layer compression wrap Notes primary dressing apligraft applied by MD. secured with adaptic and steri-strips. Electronic Signature(s) Signed: 06/14/2019 4:21:34 PM By: Kela Millin Signed: 06/15/2019 6:55:41 AM By: Linton Ham MD Entered By: Linton Ham on 06/14/2019 12:00:44 -------------------------------------------------------------------------------- Multi-Disciplinary Care Plan Details Patient Name: Date of Service: [name], [name] 06/14/2019 10:30 AM Medical Record [id number] Patient Account Number: [id number] Date of Birth/Sex: Treating RN: 31-Mar-1934 (85 y.o. [name] Primary Care Agam Tuohy: [name] Other Clinician: Referring Jamisyn Langer: Treating Dalesha Stanback/Extender:Robson, Vista Lawman, Moses Manners in Treatment: 8 Active Inactive Pain, Acute or Chronic Nursing Diagnoses: Pain, acute or chronic: actual or potential Goals: Patient/caregiver will verbalize adequate pain control between visits Date Initiated: 04/19/2019 Target Resolution Date: 06/28/2019 Goal Status: Active Interventions: Provide education on pain management Notes: Wound/Skin Impairment Nursing Diagnoses: Impaired tissue integrity Goals: Ulcer/skin breakdown will have a volume reduction of 30% by week 4 Date Initiated: 04/19/2019 Target Resolution Date: 06/28/2019 Goal Status: Active Interventions: Provide education on ulcer and skin care Notes: Electronic Signature(s) Signed: 06/14/2019 4:21:34 PM By: Kela Millin Entered By: Kela Millin on 06/14/2019 10:44:28 -------------------------------------------------------------------------------- Pain Assessment Details Patient Name: Date of  Service: [name]. 06/14/2019 10:30 AM Medical Record [id number] Patient  Account Number: [id number] Date of Birth/Sex: Treating RN: [birth year] (85 y.o. [name] Primary Care Shahira Fiske: [name] Other Clinician: Referring Rocko Fesperman: Treating Arlisha Patalano/Extender:Robson, Vista Lawman, Moses Manners in Treatment: 8 Active Problems Location of Pain Severity and Description of Pain Patient Has Paino No Site Locations Pain Management and Medication Current Pain Management: Electronic Signature(s) Signed: 06/14/2019 4:21:34 PM By: Kela Millin Signed: 06/18/2019 1:29:28 PM By: Sandre Kitty Entered By: Sandre Kitty on 06/14/2019 10:35:26 -------------------------------------------------------------------------------- Patient/Caregiver Education Details Patient Name: Date of Service: [name] 4/16/2021andnbsp10:30 AM Medical Record [id number] Patient Account Number: [id number] Date of Birth/Gender: Treating RN: 1934/04/07 (85 y.o. [name] Primary Care Physician: [name] Other Clinician: Referring Physician: Treating Physician/Extender:Robson, Vista Lawman, Moses Manners in Treatment: 8 Education Assessment Education Provided To: Patient Education Topics Provided Pain: Methods: Explain/Verbal Responses: State content correctly Wound/Skin Impairment: Methods: Explain/Verbal Responses: State content correctly Electronic Signature(s) Signed: 06/14/2019 4:21:34 PM By: Kela Millin Entered By: Kela Millin on 06/14/2019 10:44:46 -------------------------------------------------------------------------------- Wound Assessment Details Patient Name: Date of Service: [name], [name] 06/14/2019 10:30 AM Medical Record [id number] Patient Account Number: [id number] Date of Birth/Sex: Treating RN: 01-02-1935 (85 y.o. [name], [name] Primary Care Dovie Kapusta: [name] Other Clinician: Referring Verner Kopischke:  Treating Yocelin Vanlue/Extender:Robson, Vista Lawman, Moses Manners in Treatment: 8 Wound Status Wound Number: 1 Primary Etiology: Venous Leg Ulcer Wound Location: Right, Lateral Lower Leg Wound Status: Open Wounding Event: Trauma Comorbid History: Cataracts, Confinement Anxiety Date Acquired: 02/27/2019 Weeks Of Treatment: 8 Clustered Wound: No Photos Wound Measurements Length: (cm) 1.5 % Reduct Width: (cm) 1.5 % Reduct Depth: (cm) 0.1 Epitheli Area: (cm) 1.767 Tunneli Volume: (cm) 0.177 Undermi Wound Description Classification: Full Thickness Without Exposed Support Foul Odo Structures Slough/F Wound Flat and Intact Margin: Exudate Medium Amount: Exudate Serosanguineous Type: Exudate red, brown Color: Wound Bed Granulation Amount: Large (67-100%) Granulation Quality: Red Fascia E Necrotic Amount: Small (1-33%) Fat Laye Necrotic Quality: Adherent Slough Tendon E Muscle E Joint Ex Bone Exp r After Cleansing: No ibrino Yes Exposed Structure xposed: No r (Subcutaneous Tissue) Exposed: Yes xposed: No xposed: No posed: No osed: No ion in Area: 40.5% ion in Volume: 40.4% alization: Small (1-33%) ng: No ning: No Treatment Notes Wound #1 (Right, Lateral Lower Leg) 1. Cleanse With Wound Cleanser Soap and water 2. Periwound Care Barrier cream Moisturizing lotion 3. Primary Dressing Applied Cellular Based Tissue Product Other primary dressing (specifiy in notes) 4. Secondary Dressing ABD Pad 6. Support Layer Applied 3 layer compression wrap Notes primary dressing apligraft applied by MD. secured with adaptic and steri-strips. Electronic Signature(s) Signed: 06/14/2019 4:17:05 PM By: Baruch Gouty RN, BSN Signed: 06/18/2019 1:29:28 PM By: Sandre Kitty Entered By: Sandre Kitty on 06/14/2019 15:24:07 -------------------------------------------------------------------------------- Wound Assessment Details Patient Name: Date of Service: [name], [name] 06/14/2019 10:30 AM Medical Record [id number] Patient Account Number: [id number] Date of Birth/Sex: Treating RN: 05/15/1934 (85 y.o. [name] Primary Care Doneisha Ivey: [name] Other Clinician: Referring Sherelle Castelli: Treating Yvetta Drotar/Extender:Robson, Vista Lawman, Moses Manners in Treatment: 8 Wound Status Wound Number: 2 Primary Etiology: Venous Leg Ulcer Wound Location: Right, Medial Lower Leg Wound Status: Open Wounding Event: Trauma Comorbid History: Cataracts, Confinement Anxiety Date Acquired: 02/17/2019 Weeks Of Treatment: 8 Clustered Wound: No Photos Wound Measurements Length: (cm) 0.8 % Red Width: (cm) 0.4 % Red Depth: (cm) 0.2 Epith Area: (cm) 0.251 Tunn Volume: (cm) 0.05 Unde Wound Description Classification: Full Thickness Without Exposed Support Foul Structures Slou Wound Flat and Intact Margin: Exudate Small Small Amount: Exudate Serosanguineous Type:  Exudate red, brown Color: Wound Bed Granulation Amount: Large (67-100%) Granulation Quality: Red Fasci Necrotic Amount: None Present (0%) Fat L Tendo Muscl Joint Bone Odor After Cleansing: No gh/Fibrino No Exposed Structure a Exposed: No ayer (Subcutaneous Tissue) Exposed: Yes n Exposed: No e Exposed: No Exposed: No Exposed: No uction in Area: -6.4% uction in Volume: -108.3% elialization: Small (1-33%) eling: No rmining: No Treatment Notes Wound #2 (Right, Medial Lower Leg) 1. Cleanse With Wound Cleanser Soap and water 2. Periwound Care Barrier cream Moisturizing lotion 3. Primary Dressing Applied Cellular Based Tissue Product Other primary dressing (specifiy in notes) 4. Secondary Dressing ABD Pad 6. Support Layer Applied 3 layer compression wrap Notes primary dressing apligraft applied by MD. secured with adaptic and steri-strips. Electronic Signature(s) Signed: 06/14/2019 4:21:34 PM By: Kela Millin Signed: 06/18/2019 1:29:28 PM By: Sandre Kitty Entered By: Sandre Kitty on 06/14/2019 15:24:25 -------------------------------------------------------------------------------- Vitals Details Patient Name: Date of Service: [name]. 06/14/2019 10:30 AM Medical Record [id number] Patient Account Number: [id number] Date of Birth/Sex: Treating RN: [birth year] (85 y.o. [name] Primary Care Oluwatoni Rotunno: [name] Other Clinician: Referring Analleli Gierke: Treating Filip Luten/Extender:Robson, Vista Lawman, Moses Manners in Treatment: 8 Vital Signs Time Taken: 10:35 Temperature (F): 97.7 Height (in): 64 Pulse (bpm): 87 Weight (lbs): 125 Respiratory Rate (breaths/min): 18 Body Mass Index (BMI): 21.5 Blood Pressure (mmHg): 139/88 Reference Range: 80 - 120 mg / dl Electronic Signature(s) Signed: 06/18/2019 1:29:28 PM By: Sandre Kitty Entered By: Sandre Kitty on 06/14/2019 10:35:20

## 2019-06-20 DIAGNOSIS — J849 Interstitial pulmonary disease, unspecified: Secondary | ICD-10-CM | POA: Diagnosis not present

## 2019-06-21 ENCOUNTER — Encounter (HOSPITAL_BASED_OUTPATIENT_CLINIC_OR_DEPARTMENT_OTHER): Payer: Medicare HMO | Admitting: Internal Medicine

## 2019-06-21 ENCOUNTER — Other Ambulatory Visit (INDEPENDENT_AMBULATORY_CARE_PROVIDER_SITE_OTHER): Payer: Medicare HMO

## 2019-06-21 ENCOUNTER — Other Ambulatory Visit: Payer: Self-pay

## 2019-06-21 DIAGNOSIS — S81811D Laceration without foreign body, right lower leg, subsequent encounter: Secondary | ICD-10-CM | POA: Diagnosis not present

## 2019-06-21 DIAGNOSIS — J849 Interstitial pulmonary disease, unspecified: Secondary | ICD-10-CM

## 2019-06-21 DIAGNOSIS — L97812 Non-pressure chronic ulcer of other part of right lower leg with fat layer exposed: Secondary | ICD-10-CM | POA: Diagnosis not present

## 2019-06-21 DIAGNOSIS — I87311 Chronic venous hypertension (idiopathic) with ulcer of right lower extremity: Secondary | ICD-10-CM | POA: Diagnosis not present

## 2019-06-21 DIAGNOSIS — Z85828 Personal history of other malignant neoplasm of skin: Secondary | ICD-10-CM | POA: Diagnosis not present

## 2019-06-21 DIAGNOSIS — W228XXD Striking against or struck by other objects, subsequent encounter: Secondary | ICD-10-CM | POA: Diagnosis not present

## 2019-06-21 DIAGNOSIS — Z7952 Long term (current) use of systemic steroids: Secondary | ICD-10-CM | POA: Diagnosis not present

## 2019-06-21 DIAGNOSIS — Z5181 Encounter for therapeutic drug level monitoring: Secondary | ICD-10-CM | POA: Diagnosis not present

## 2019-06-21 DIAGNOSIS — E785 Hyperlipidemia, unspecified: Secondary | ICD-10-CM | POA: Diagnosis not present

## 2019-06-21 LAB — HEPATIC FUNCTION PANEL
ALT: 23 U/L (ref 0–35)
AST: 24 U/L (ref 0–37)
Albumin: 4 g/dL (ref 3.5–5.2)
Alkaline Phosphatase: 73 U/L (ref 39–117)
Bilirubin, Direct: 0.1 mg/dL (ref 0.0–0.3)
Total Bilirubin: 0.6 mg/dL (ref 0.2–1.2)
Total Protein: 7.1 g/dL (ref 6.0–8.3)

## 2019-06-21 NOTE — Progress Notes (Signed)
LFT normal

## 2019-06-25 ENCOUNTER — Telehealth: Payer: Self-pay | Admitting: Internal Medicine

## 2019-06-25 DIAGNOSIS — J849 Interstitial pulmonary disease, unspecified: Secondary | ICD-10-CM

## 2019-06-25 NOTE — Progress Notes (Signed)
[name], [name] ([id number]) Visit Report for 06/21/2019 SuperBill Details Patient Name: Date of Service: [name], [name] 06/21/2019 Medical Record [id number] Patient Account Number: [id number] Date of Birth/Sex: Treating RN: 1934/08/21 (85 y.o. [name] Primary Care Provider: [name] Other Clinician: Referring Provider: Treating Provider/Extender:Jaquia Benedicto, Vista Lawman, Moses Manners in Treatment: 9 Diagnosis Coding ICD-10 Codes Code Description I87.311 Chronic venous hypertension (idiopathic) with ulcer of right lower extremity L97.812 Non-pressure chronic ulcer of other part of right lower leg with fat layer exposed S81.811D Laceration without foreign body, right lower leg, subsequent encounter Z92.241 Personal history of systemic steroid therapy Facility Procedures CPT4 Code Description Modifier Quantity [phone] (Facility Use Only) [phone] - APPLY MULTLAY COMPRS LWR RT LEG 1 Electronic Signature(s) Signed: 06/21/2019 5:22:40 PM By: Linton Ham MD Signed: 06/25/2019 5:28:23 PM By: Carlene Coria RN Entered By: Carlene Coria on 06/21/2019 11:17:30

## 2019-06-25 NOTE — Telephone Encounter (Signed)
[facility] HRCT given to [place] on Side A 11:29 AM  06/25/2019\ - pls let me know when uploaded. If it is IPF  Pattern given her decline she might qualify for study

## 2019-06-25 NOTE — Progress Notes (Signed)
[name], [name] ([id number]) Visit Report for 06/21/2019 Arrival Information Details Patient Name: Date of Service: [name], [name] 06/21/2019 11:00 AM Medical Record [id number] Patient Account Number: [id number] Date of Birth/Sex: Treating RN: 1934-05-01 (85 y.o. [name] Primary Care Khamiyah Grefe: [name] Other Clinician: Referring Gavin Faivre: Treating Mclain Freer/Extender:Robson, Vista Lawman, Moses Manners in Treatment: 9 Visit Information History Since Last Visit Added or deleted any medications: No Patient Arrived: Ambulatory Any new allergies or adverse reactions: No Arrival Time: 11:05 Had a fall or experienced change in No Accompanied By: self activities of daily living that may affect Transfer Assistance: None risk of falls: Patient Identification Verified: Yes Signs or symptoms of abuse/neglect since last visito No Secondary Verification Process Completed: Yes Hospitalized since last visit: No Patient Requires Transmission-Based No Implantable device outside of the clinic excluding No Precautions: cellular tissue based products placed in the center Patient Has Alerts: No since last visit: Has Dressing in Place as Prescribed: Yes Has Compression in Place as Prescribed: Yes Pain Present Now: No Electronic Signature(s) Signed: 06/21/2019 5:13:47 PM By: Kela Millin Entered By: Kela Millin on 06/21/2019 11:09:32 -------------------------------------------------------------------------------- Compression Therapy Details Patient Name: Date of Service: [name] 06/21/2019 11:00 AM Medical Record [id number] Patient Account Number: [id number] Date of Birth/Sex: Treating RN: February 02, 1935 (85 y.o. [name] Primary Care Remberto Lienhard: [name] Other Clinician: Referring Elzina Devera: Treating Tapanga Ottaway/Extender:Robson, Vista Lawman, Moses Manners in Treatment: 9 Compression Therapy Performed for Wound Wound #2 Right,Medial  Lower Leg Assessment: Performed By: Clinician Kela Millin, RN Compression Type: Three Layer Electronic Signature(s) Signed: 06/21/2019 5:13:47 PM By: Kela Millin Entered By: Kela Millin on 06/21/2019 11:14:02 -------------------------------------------------------------------------------- Compression Therapy Details Patient Name: Date of Service: [name] 06/21/2019 11:00 AM Medical Record [id number] Patient Account Number: [id number] Date of Birth/Sex: Treating RN: Dec 16, 1934 (85 y.o. [name] Primary Care Avaleigh Decuir: [name] Other Clinician: Referring Tresean Mattix: Treating Emelie Newsom/Extender:Robson, Vista Lawman, Moses Manners in Treatment: 9 Compression Therapy Performed for Wound Wound #1 Right,Lateral Lower Leg Assessment: Performed By: Clinician Kela Millin, RN Compression Type: Three Layer Electronic Signature(s) Signed: 06/21/2019 5:13:47 PM By: Kela Millin Entered By: Kela Millin on 06/21/2019 11:14:03 -------------------------------------------------------------------------------- Encounter Discharge Information Details Patient Name: Date of Service: [name] 06/21/2019 11:00 AM Medical Record [id number] Patient Account Number: [id number] Date of Birth/Sex: Treating RN: [birth year] (85 y.o. [name] Primary Care Severn Goddard: [name] Other Clinician: Referring Elaiza Shoberg: Treating Andrika Peraza/Extender:Robson, Vista Lawman, Moses Manners in Treatment: 9 Encounter Discharge Information Items Discharge Condition: Stable Ambulatory Status: Ambulatory Discharge Destination: Home Transportation: Private Auto Accompanied By: self Schedule Follow-up Appointment: Yes Clinical Summary of Care: Patient Declined Electronic Signature(s) Signed: 06/25/2019 5:28:23 PM By: Carlene Coria RN Entered By: Carlene Coria on 06/21/2019  11:16:14 -------------------------------------------------------------------------------- Patient/Caregiver Education Details Patient Name: Date of Service: [name], [name] 4/23/2021andnbsp11:00 AM Medical Record [id number] Patient Account Number: [id number] Date of Birth/Gender: 1934-11-30 (85 y.o. F) Treating RN: Carlene Coria Primary Care Physician: [name] Other Clinician: Referring Physician: Treating Physician/Extender:Robson, Vista Lawman, Moses Manners in Treatment: 9 Education Assessment Education Provided To: Patient Education Topics Provided Wound/Skin Impairment: Methods: Explain/Verbal Responses: State content correctly [employer]) Signed: 06/25/2019 5:28:23 PM By: Carlene Coria RN Entered By: Carlene Coria on 06/21/2019 11:16:02 -------------------------------------------------------------------------------- Wound Assessment Details Patient Name: Date of Service: [name] 06/21/2019 11:00 AM Medical Record [id number] Patient Account Number: [id number] Date of Birth/Sex: Treating RN: [birth year] (85 y.o. [name] Primary Care Kelby Adell: [name] Other Clinician: Referring Tryton Bodi: Treating Deidre Carino/Extender:Robson, Vista Lawman, Moses Manners  in Treatment: 9 Wound Status Wound Number: 1 Primary Etiology: Venous Leg Ulcer Wound Location: Right, Lateral Lower Leg Wound Status: Open Wounding Event: Trauma Comorbid History: Cataracts, Confinement Anxiety Date Acquired: 02/27/2019 Weeks Of Treatment:9 Clustered Wound: No Wound Measurements Length: (cm) 1.5 % Redu Width: (cm) 1.5 % Redu Depth: (cm) 0.1 Epithe Area: (cm) 1.767 Tunne Volume: (cm) 0.177 Under Wound Description Full Thickness Without Exposed Support Foul Classification: Structures Sloug Wound Flat and Intact Margin: Exudate Medium Amount: Exudate Type:Serosanguineous Exudate red, brown Color: Wound Bed Granulation Amount: Large  (67-100%) Granulation Quality: Red Fasci Necrotic Amount: Small (1-33%) Fat L Necrotic Quality: Adherent Slough Tendo Muscl Joint Bone Odor After Cleansing: No h/Fibrino Yes Exposed Structure a Exposed: No ayer (Subcutaneous Tissue) Exposed: Yes n Exposed: No e Exposed: No Exposed: No Exposed: No ction in Area: 40.5% ction in Volume: 40.4% lialization: Small (1-33%) ling: No mining: No Treatment Notes Wound #1 (Right, Lateral Lower Leg) 1. Cleanse With Soap and water 4. Secondary Dressing Dry Gauze 6. Support Layer Applied 3 layer compression wrap Notes primary dressing apligraft applied by MD. secured with adaptic and steri-strips. Electronic Signature(s) Signed: 06/21/2019 5:13:47 PM By: Kela Millin Entered By: Kela Millin on 06/21/2019 11:11:47 -------------------------------------------------------------------------------- Wound Assessment Details Patient Name: Date of Service: ARAH, ARO 06/21/2019 11:00 AM Medical Record [id number] Patient Account Number: [id number] Date of Birth/Sex: Treating RN: 07/26/1934 (85 y.o. [name] Primary Care Juaquina Machnik: [name] Other Clinician: Referring Nyaire Denbleyker: Treating Haze Antillon/Extender:Robson, Vista Lawman, Moses Manners in Treatment: 9 Wound Status Wound Number: 2 Primary Etiology: Venous Leg Ulcer Wound Location: Right, Medial Lower Leg Wound Status: Open Wounding Event: Trauma Comorbid History: Cataracts, Confinement Anxiety Date Acquired: 02/17/2019 Weeks Of Treatment:9 Clustered Wound: No Wound Measurements Length: (cm) 0.8 % Redu Width: (cm) 0.4 % Redu Depth: (cm) 0.2 Epithe Area: (cm) 0.251 Tunne Volume: (cm) 0.05 Under Wound Description Full Thickness Without Exposed Support Classification: Structures Wound Flat and Intact Margin: Exudate Small Small Amount: Exudate Type:Serosanguineous Exudate red, brown Color: Wound Bed Granulation Amount: Large  (67-100%) Granulation Quality: Red Necrotic Amount: None Present (0%) Foul Odor After Cleansing: No Slough/Fibrino No Exposed Structure Fascia Exposed: No Fat Layer (Subcutaneous Tissue) Exposed: Yes Tendon Exposed: No Muscle Exposed: No Joint Exposed: No Bone Exposed: No ction in Area: -6.4% ction in Volume: -108.3% lialization: Small (1-33%) ling: No mining: No Treatment Notes Wound #2 (Right, Medial Lower Leg) 1. Cleanse With Wound Cleanser 4. Secondary Dressing Dry Gauze 6. Support Layer Applied 3 layer compression wrap Notes primary dressing apligraft applied by MD. secured with adaptic and steri-strips. Electronic Signature(s) Signed: 06/21/2019 5:13:47 PM By: Kela Millin Entered By: Kela Millin on 06/21/2019 11:12:02 -------------------------------------------------------------------------------- Vitals Details Patient Name: Date of Service: [name] 06/21/2019 11:00 AM Medical Record [id number] Patient Account Number: [id number] Date of Birth/Sex: Treating RN: June 12, 1934 (85 y.o. [name] Primary Care Justyn Langham: [name] Other Clinician: Referring Pharrell Ledford: Treating Brylee Mcgreal/Extender:Robson, Vista Lawman, Moses Manners in Treatment: 9 Vital Signs Time Taken: 11:10 Temperature (F): 98 Height (in): 64 Pulse (bpm): 88 Weight (lbs): 125 Respiratory Rate (breaths/min): 19 Body Mass Index (BMI): 21.5 Blood Pressure (mmHg): 140/83 Reference Range: 80 - 120 mg / dl Electronic Signature(s) Signed: 06/21/2019 5:13:47 PM By: Kela Millin Entered By: Kela Millin on 06/21/2019 11:10:18

## 2019-06-27 NOTE — Telephone Encounter (Signed)
Disc was sent to [facility] to have images uploaded. Once disc is returned, will look to make sure it shows that they are in pt's chart and will then update MR.

## 2019-07-01 ENCOUNTER — Encounter (HOSPITAL_BASED_OUTPATIENT_CLINIC_OR_DEPARTMENT_OTHER): Payer: Medicare HMO | Attending: Internal Medicine | Admitting: Internal Medicine

## 2019-07-01 DIAGNOSIS — I87311 Chronic venous hypertension (idiopathic) with ulcer of right lower extremity: Secondary | ICD-10-CM | POA: Insufficient documentation

## 2019-07-01 DIAGNOSIS — J849 Interstitial pulmonary disease, unspecified: Secondary | ICD-10-CM | POA: Insufficient documentation

## 2019-07-01 DIAGNOSIS — I10 Essential (primary) hypertension: Secondary | ICD-10-CM | POA: Diagnosis not present

## 2019-07-01 DIAGNOSIS — Z85828 Personal history of other malignant neoplasm of skin: Secondary | ICD-10-CM | POA: Diagnosis not present

## 2019-07-01 DIAGNOSIS — X58XXXA Exposure to other specified factors, initial encounter: Secondary | ICD-10-CM | POA: Diagnosis not present

## 2019-07-01 DIAGNOSIS — S81811A Laceration without foreign body, right lower leg, initial encounter: Secondary | ICD-10-CM | POA: Diagnosis not present

## 2019-07-01 DIAGNOSIS — Z7952 Long term (current) use of systemic steroids: Secondary | ICD-10-CM | POA: Diagnosis not present

## 2019-07-01 DIAGNOSIS — L97812 Non-pressure chronic ulcer of other part of right lower leg with fat layer exposed: Secondary | ICD-10-CM | POA: Diagnosis not present

## 2019-07-01 DIAGNOSIS — E785 Hyperlipidemia, unspecified: Secondary | ICD-10-CM | POA: Diagnosis not present

## 2019-07-01 DIAGNOSIS — I872 Venous insufficiency (chronic) (peripheral): Secondary | ICD-10-CM | POA: Diagnosis not present

## 2019-07-01 NOTE — Progress Notes (Signed)
[name], [name] ([id number]) Visit Report for 07/01/2019 Cellular or Tissue Based Product Details Patient Name: Date of Service: [name], [place] PE P. 07/01/2019 10:30 A M Medical Record Number: [id number] Patient Account Number: [id number] Date of Birth/Sex: Treating RN: 1934/11/16 (85 y.o. [name] Primary Care Provider: [name] Other Clinician: Referring Provider: Treating Provider/Extender: Louis Meckel in Treatment: 10 Cellular or Tissue Based Product Type Wound #1 Right,Lateral Lower Leg Applied to: Performed By: Physician Ricard Dillon., MD Cellular or Tissue Based Product Type: Apligraf Level of Consciousness (Pre-procedure): Awake and Alert Pre-procedure Verification/Time Out Yes - 11:25 Taken: Location: trunk / arms / legs Wound Size (sq cm): 0.64 Product Size (sq cm): 22 Waste Size (sq cm): 0 Amount of Product Applied (sq cm): 22 Lot #: [id number] Order #: 3 Expiration Date: 07/09/2019 Fenestrated: Yes Instrument: Blade Reconstituted: Yes Solution Type: normal saline Solution Amount: 10 ml Lot #: [id number] Solution Expiration Date: 01/27/2021 Secured: Yes Secured With: Steri-Strips Dressing Applied: Yes Primary Dressing: adaptic, abd pad, compression wrap Procedural Pain: 0 Post Procedural Pain: 0 Response to Treatment: Procedure was tolerated well Level of Consciousness (Post- Awake and Alert procedure): Post Procedure Diagnosis Same as Pre-procedure Electronic Signature(s) Signed: 07/01/2019 5:33:20 PM By: Linton Ham MD Entered By: Linton Ham on 07/01/2019 12:01:19 -------------------------------------------------------------------------------- Cellular or Tissue Based Product Details Patient Name: Date of Service: [name], [name] 07/01/2019 10:30 A M Medical Record Number: [id number] Patient Account Number: [id number] Date of Birth/Sex: Treating RN: 21-Sep-1934 (85 y.o. [name] Primary Care  Provider: [name] Other Clinician: Referring Provider: Treating Provider/Extender: Louis Meckel in Treatment: 10 Cellular or Tissue Based Product Type Wound #2 Right,Medial Lower Leg Applied to: Performed By: Physician Ricard Dillon., MD Cellular or Tissue Based Product Type: Apligraf Level of Consciousness (Pre-procedure): Awake and Alert Pre-procedure Verification/Time Out Yes - 11:25 Taken: Location: trunk / arms / legs Wound Size (sq cm): 0.2 Product Size (sq cm): 22 Waste Size (sq cm): 12 Waste Reason: wound size Amount of Product Applied (sq cm): 10 Lot #: [id number] Order #: 3 Expiration Date: 07/09/2019 Fenestrated: Yes Instrument: Blade Reconstituted: Yes Solution Type: normal saline Solution Amount: 10 ml Lot #: [id number] Solution Expiration Date: 01/27/2021 Secured: Yes Secured With: Steri-Strips Dressing Applied: Yes Primary Dressing: adaptic, abd pad, compression wrap Procedural Pain: 0 Post Procedural Pain: 0 Response to Treatment: Procedure was tolerated well Level of Consciousness (Post- Awake and Alert procedure): Post Procedure Diagnosis Same as Pre-procedure Electronic Signature(s) Signed: 07/01/2019 5:33:20 PM By: Linton Ham MD Entered By: Linton Ham on 07/01/2019 12:01:27 -------------------------------------------------------------------------------- HPI Details Patient Name: Date of Service: [name], [name] 07/01/2019 10:30 A M Medical Record Number: [id number] Patient Account Number: [id number] Date of Birth/Sex: Treating RN: [birth year] (85 y.o. [name] Primary Care Provider: [name] Other Clinician: Referring Provider: Treating Provider/Extender: Herma Ard, Moses Manners in Treatment: 10 History of Present Illness HPI Description: ADMISSION 04/19/2019 This is an 85 year old woman who has chronic interstitial lung disease on prednisone 10 mg a day. T owards the end of  December she hit her lateral right leg on a chair. She was seen in urgent care and the area was Steri-Stripped however this did not hold together. Sometime after this traumatized the medial part of her right leg. She has some chronic edema in her lower legs probably is a secondary phenomenon to chronic venous insufficiency. She also takes chronic steroids. He saw  her primary physician who is Dr. [name] at [place] and was given doxycycline on 2/8 I believe she has completed that. She is here for review of nonhealing areas on the right lateral greater than right medial calf Past medical history; includes interstitial lung disease on prednisone 10 mg and is just started Ofev. She has a history of basal cell skin cancer, hyperlipidemia. ABI in our clinic was 1.04 in the right 2/26; patient with chronic venous insufficiency and interstitial lung disease on chronic prednisone. She has a wound on her right lateral and right medial lower leg on the right which were separate incidents of trauma. These wounds started in December. She had very significant debris on both of the wounds last week which required debridement they require debridement again today. We have been using Iodoflex 3/4; the patient has two wounds on her right lower leg one laterally and one medially in the setting of chronic venous hypertension and resultant separate incidences of trauma. The wounds actually look better in terms of surface. We have been using Iodoflex, changed to [facility] classic today 3/11; she complained of pain almost like lancinating pain in the wound on the right lateral leg. This was new this week and she attributed this to the fact that we changed her to [facility]. Previously everything was well tolerated including at least 2 weeks of Sorbact. She has a $086 per application co-pay for Apligraf. She expressed the fact that it they would be able to pay this if it was felt to be necessary 3/19; patient is  complaining about the facility charge coming here. We listened to this for a while. We put polymen on this last week after she experience pain with Hydrofera Blue she seems to tolerate this better. She has an area on the right lateral and right medial leg in the setting of chronic venous insufficiency and chronic prednisone use. Although her overall surface area is not any better the wound surface certainly looks a lot better 3/26; patient has 2 traumatic areas in the setting of chronic venous insufficiency. One is on the right lateral and a separate incidence on the right medial lower extremity. These are not that large and the surface of them looks adequate however very slow to come down in size. 05/31/2019 upon evaluation today patient appears to be doing decently well in regard to her wounds today. Both are measuring slightly smaller which is good news. Fortunately there is no signs of active infection at this time. Subsequently I do believe that she is a candidate for proceeding with the Apligraf at this point we did get approval and she does want to proceed as such. Fortunately there is no signs of active infection at this time. 4/16; patient arrives in clinic today for application of Apligraf #2. The patient was initially very upset and angry about the slow progress of her wound healing and the effect this is having on her life, her lung function [idiopathic pulmonary fibrosiso] And has caused a deterioration in her pulmonary function test. Although I certainly was willing to listen to the effect is having on her life and her quality of life I told her that I did not think this was causing a deterioration in her pulmonary function tests. She follows with the pulmonary clinic at [facility] and at 1 point in this discussion I did offer to refer her for consultation at the [facility] wound care clinic which she declined. 5/3; the patient's major wound on the right lateral  lower leg has contracted in a major way.  She still has a smaller area medially on the right. We applied Apligraf #3 today. Electronic Signature(s) Signed: 07/01/2019 5:33:20 PM By: Linton Ham MD Entered By: Linton Ham on 07/01/2019 12:04:51 -------------------------------------------------------------------------------- Physical Exam Details Patient Name: Date of Service: [name], [name] 07/01/2019 10:30 A M Medical Record Number: [id number] Patient Account Number: [id number] Date of Birth/Sex: Treating RN: 07/09/1934 (85 y.o. [name] Primary Care Provider: [name] Other Clinician: Referring Provider: Treating Provider/Extender: Herma Ard, Moses Manners in Treatment: 10 Notes Wound exam; Apligraf #3 applied in the standard fashion to both areas on the right lateral greater than right medial lower leg. The area laterally which is her major wound has contracted nicely. Slightly smaller medially. Electronic Signature(s) Signed: 07/01/2019 5:33:20 PM By: Linton Ham MD Entered By: Linton Ham on 07/01/2019 12:05:34 -------------------------------------------------------------------------------- Physician Orders Details Patient Name: Date of Service: [name], [name] 07/01/2019 10:30 A M Medical Record Number: [id number] Patient Account Number: [id number] Date of Birth/Sex: Treating RN: [birth year] (85 y.o. [name] Primary Care Provider: [name] Other Clinician: Referring Provider: Treating Provider/Extender: Louis Meckel in Treatment: 10 Verbal / Phone Orders: No Diagnosis Coding ICD-10 Coding Code Description I87.311 Chronic venous hypertension (idiopathic) with ulcer of right lower extremity L97.812 Non-pressure chronic ulcer of other part of right lower leg with fat layer exposed S81.811D Laceration without foreign body, right lower leg, subsequent encounter Z92.241 Personal history of systemic steroid therapy Follow-up  Appointments ppointment in 2 weeks. - MD visit Return A Nurse Visit: - 1 week for rewrap Dressing Change Frequency Wound #1 Right,Lateral Lower Leg Do not change entire dressing for one week. Wound #2 Right,Medial Lower Leg Do not change entire dressing for one week. Wound #3 Left,Medial Lower Leg Change dressing every day. Skin Barriers/Peri-Wound Care Barrier cream - to periowund Moisturizing lotion Wound Cleansing May shower with protection. - use a cast protector. can get at [facility] or [facility] Primary Wound Dressing Wound #1 Right,Lateral Lower Leg pplication - Apligraft #3 Skin Substitute A Wound #2 Right,Medial Lower Leg pplication - Apligraft #3 Skin Substitute A Wound #3 Left,Medial Lower Leg Foam - Foam border or large bandaid Secondary Dressing Wound #1 Right,Lateral Lower Leg daptic Dressing - secure with steri strips A ABD pad Wound #2 Right,Medial Lower Leg daptic Dressing - secure with steri strips A ABD pad Edema Control 3 Layer Compression System - Right Lower Extremity Avoid standing for long periods of time Elevate legs to the level of the heart or above for 30 minutes daily and/or when sitting, a frequency of: Exercise regularly Electronic Signature(s) Signed: 07/01/2019 5:27:06 PM By: Levan Hurst RN, BSN Signed: 07/01/2019 5:33:20 PM By: Linton Ham MD Entered By: Levan Hurst on 07/01/2019 11:24:56 -------------------------------------------------------------------------------- Problem List Details Patient Name: Date of Service: [name], [name] 07/01/2019 10:30 A M Medical Record Number: [id number] Patient Account Number: [id number] Date of Birth/Sex: Treating RN: Apr 07, 1934 (85 y.o. [name] Primary Care Provider: [name] Other Clinician: Referring Provider: Treating Provider/Extender: Louis Meckel in Treatment: 10 Active Problems ICD-10 Encounter Code Description Active Date  MDM Diagnosis I87.311 Chronic venous hypertension (idiopathic) with ulcer of right lower extremity 04/19/2019 No Yes L97.812 Non-pressure chronic ulcer of other part of right lower leg with fat layer 04/19/2019 No Yes exposed S81.811D Laceration without foreign body, right lower leg, subsequent encounter 04/19/2019 No Yes Z92.241 Personal history of  systemic steroid therapy 04/19/2019 No Yes Inactive Problems Resolved Problems Electronic Signature(s) Signed: 07/01/2019 5:33:20 PM By: Linton Ham MD Entered By: Linton Ham on 07/01/2019 12:00:57 -------------------------------------------------------------------------------- Progress Note Details Patient Name: Date of Service: [name], [name] 07/01/2019 10:30 A M Medical Record Number: [id number] Patient Account Number: [id number] Date of Birth/Sex: Treating RN: December 07, 1934 (85 y.o. [name] Primary Care Provider: [name] Other Clinician: Referring Provider: Treating Provider/Extender: Herma Ard, Moses Manners in Treatment: 10 Subjective History of Present Illness (HPI) ADMISSION 04/19/2019 This is an 85 year old woman who has chronic interstitial lung disease on prednisone 10 mg a day. T owards the end of December she hit her lateral right leg on a chair. She was seen in urgent care and the area was Steri-Stripped however this did not hold together. Sometime after this traumatized the medial part of her right leg. She has some chronic edema in her lower legs probably is a secondary phenomenon to chronic venous insufficiency. She also takes chronic steroids. He saw her primary physician who is Dr. [name] at [place] and was given doxycycline on 2/8 I believe she has completed that. She is here for review of nonhealing areas on the right lateral greater than right medial calf Past medical history; includes interstitial lung disease on prednisone 10 mg and is just started Ofev. She has a history of basal cell  skin cancer, hyperlipidemia. ABI in our clinic was 1.04 in the right 2/26; patient with chronic venous insufficiency and interstitial lung disease on chronic prednisone. She has a wound on her right lateral and right medial lower leg on the right which were separate incidents of trauma. These wounds started in December. She had very significant debris on both of the wounds last week which required debridement they require debridement again today. We have been using Iodoflex 3/4; the patient has two wounds on her right lower leg one laterally and one medially in the setting of chronic venous hypertension and resultant separate incidences of trauma. The wounds actually look better in terms of surface. We have been using Iodoflex, changed to [facility] classic today 3/11; she complained of pain almost like lancinating pain in the wound on the right lateral leg. This was new this week and she attributed this to the fact that we changed her to [facility]. Previously everything was well tolerated including at least 2 weeks of Sorbact. She has a $431 per application co-pay for Apligraf. She expressed the fact that it they would be able to pay this if it was felt to be necessary 3/19; patient is complaining about the facility charge coming here. We listened to this for a while. We put polymen on this last week after she experience pain with Hydrofera Blue she seems to tolerate this better. She has an area on the right lateral and right medial leg in the setting of chronic venous insufficiency and chronic prednisone use. Although her overall surface area is not any better the wound surface certainly looks a lot better 3/26; patient has 2 traumatic areas in the setting of chronic venous insufficiency. One is on the right lateral and a separate incidence on the right medial lower extremity. These are not that large and the surface of them looks adequate however very slow to come down in size. 05/31/2019  upon evaluation today patient appears to be doing decently well in regard to her wounds today. Both are measuring slightly smaller which is good news. Fortunately there is no  signs of active infection at this time. Subsequently I do believe that she is a candidate for proceeding with the Apligraf at this point we did get approval and she does want to proceed as such. Fortunately there is no signs of active infection at this time. 4/16; patient arrives in clinic today for application of Apligraf #2. The patient was initially very upset and angry about the slow progress of her wound healing and the effect this is having on her life, her lung function [idiopathic pulmonary fibrosiso] And has caused a deterioration in her pulmonary function test. Although I certainly was willing to listen to the effect is having on her life and her quality of life I told her that I did not think this was causing a deterioration in her pulmonary function tests. She follows with the pulmonary clinic at [facility] and at 1 point in this discussion I did offer to refer her for consultation at the [facility] wound care clinic which she declined. 5/3; the patient's major wound on the right lateral lower leg has contracted in a major way. She still has a smaller area medially on the right. We applied Apligraf #3 today. Objective Constitutional Vitals Time Taken: 10:28 AM, Height: 64 in, Weight: 125 lbs, BMI: 21.5, Temperature: 97.8 F, Pulse: 93 bpm, Respiratory Rate: 19 breaths/min, Blood Pressure: 123/74 mmHg. Integumentary (Hair, Skin) Wound #1 status is Open. Original cause of wound was Trauma. The wound is located on the Right,Lateral Lower Leg. The wound measures 0.8cm length x 0.8cm width x 0.2cm depth; 0.503cm^2 area and 0.101cm^3 volume. There is Fat Layer (Subcutaneous Tissue) Exposed exposed. There is no tunneling or undermining noted. There is a medium amount of serosanguineous drainage noted. The wound margin is flat and  intact. There is medium (34-66%) red granulation within the wound bed. There is a medium (34-66%) amount of necrotic tissue within the wound bed including Adherent Slough. Wound #2 status is Open. Original cause of wound was Trauma. The wound is located on the Right,Medial Lower Leg. The wound measures 0.5cm length x 0.4cm width x 0.1cm depth; 0.157cm^2 area and 0.016cm^3 volume. There is Fat Layer (Subcutaneous Tissue) Exposed exposed. There is no tunneling or undermining noted. There is a small amount of serosanguineous drainage noted. The wound margin is flat and intact. There is large (67-100%) red granulation within the wound bed. There is a small (1-33%) amount of necrotic tissue within the wound bed including Adherent Slough. Wound #3 status is Open. Original cause of wound was Trauma. The wound is located on the Left,Medial Lower Leg. The wound measures 1cm length x 0.2cm width x 0.1cm depth; 0.157cm^2 area and 0.016cm^3 volume. There is no tunneling or undermining noted. There is a small amount of serosanguineous drainage noted. The wound margin is distinct with the outline attached to the wound base. There is large (67-100%) pink granulation within the wound bed. There is no necrotic tissue within the wound bed. Assessment Active Problems ICD-10 Chronic venous hypertension (idiopathic) with ulcer of right lower extremity Non-pressure chronic ulcer of other part of right lower leg with fat layer exposed Laceration without foreign body, right lower leg, subsequent encounter Personal history of systemic steroid therapy Procedures Wound #1 Pre-procedure diagnosis of Wound #1 is a Venous Leg Ulcer located on the Right,Lateral Lower Leg. A skin graft procedure using a bioengineered skin substitute/cellular or tissue based product was performed by Ricard Dillon., MD. Apligraf was applied and secured with Steri-Strips. 22 sq cm of product was utilized  and 0 sq cm was wasted. Post  Application, adaptic, abd pad, compression wrap was applied. A Time Out was conducted at 11:25, prior to the start of the procedure. The procedure was tolerated well with a pain level of 0 throughout and a pain level of 0 following the procedure. Post procedure Diagnosis Wound #1: Same as Pre-Procedure . Pre-procedure diagnosis of Wound #1 is a Venous Leg Ulcer located on the Right,Lateral Lower Leg . There was a Three Layer Compression Therapy Procedure by Levan Hurst, RN. Post procedure Diagnosis Wound #1: Same as Pre-Procedure Wound #2 Pre-procedure diagnosis of Wound #2 is a Venous Leg Ulcer located on the Right,Medial Lower Leg. A skin graft procedure using a bioengineered skin substitute/cellular or tissue based product was performed by Ricard Dillon., MD. Apligraf was applied and secured with Steri-Strips. 10 sq cm of product was utilized and 12 sq cm was wasted due to wound size. Post Application, adaptic, abd pad, compression wrap was applied. A Time Out was conducted at 11:25, prior to the start of the procedure. The procedure was tolerated well with a pain level of 0 throughout and a pain level of 0 following the procedure. Post procedure Diagnosis Wound #2: Same as Pre-Procedure . Pre-procedure diagnosis of Wound #2 is a Venous Leg Ulcer located on the Right,Medial Lower Leg . There was a Three Layer Compression Therapy Procedure by Levan Hurst, RN. Post procedure Diagnosis Wound #2: Same as Pre-Procedure Plan Follow-up Appointments: Return Appointment in 2 weeks. - MD visit Nurse Visit: - 1 week for rewrap Dressing Change Frequency: Wound #1 Right,Lateral Lower Leg: Do not change entire dressing for one week. Wound #2 Right,Medial Lower Leg: Do not change entire dressing for one week. Wound #3 Left,Medial Lower Leg: Change dressing every day. Skin Barriers/Peri-Wound Care: Barrier cream - to periowund Moisturizing lotion Wound Cleansing: May shower with  protection. - use a cast protector. can get at [facility] or [facility] Primary Wound Dressing: Wound #1 Right,Lateral Lower Leg: Skin Substitute Application - Apligraft #3 Wound #2 Right,Medial Lower Leg: Skin Substitute Application - Apligraft #3 Wound #3 Left,Medial Lower Leg: Foam - Foam border or large bandaid Secondary Dressing: Wound #1 Right,Lateral Lower Leg: Adaptic Dressing - secure with steri strips ABD pad Wound #2 Right,Medial Lower Leg: Adaptic Dressing - secure with steri strips ABD pad Edema Control: 3 Layer Compression System - Right Lower Extremity Avoid standing for long periods of time Elevate legs to the level of the heart or above for 30 minutes daily and/or when sitting, a frequency of: Exercise regularly 1. Apligraf #3 applied in the standard fashion 2. The patient had a nail injury/skin tear on the left anterior lower tibia area. There was no open wound here she has a compression stocking on this I have advised her to keep this area covered with a thick Band-Aid Electronic Signature(s) Signed: 07/01/2019 5:33:20 PM By: Linton Ham MD Entered By: Linton Ham on 07/01/2019 12:06:38 -------------------------------------------------------------------------------- SuperBill Details Patient Name: Date of Service: [name], [name] 07/01/2019 Medical Record Number: [id number] Patient Account Number: [id number] Date of Birth/Sex: Treating RN: 07-23-1934 (85 y.o. [name] Primary Care Provider: [name] Other Clinician: Referring Provider: Treating Provider/Extender: Louis Meckel in Treatment: 10 Diagnosis Coding ICD-10 Codes Code Description [phone] Chronic venous hypertension (idiopathic) with ulcer of right lower extremity L97.812 Non-pressure chronic ulcer of other part of right lower leg with fat layer exposed S81.811D Laceration without foreign body, right lower leg, subsequent encounter  S50.539 Personal history of  systemic steroid therapy Facility Procedures CPT4 Code: [id number] Description: (Facility Use Only) Apligraf 1 SQ CM Modifier: Quantity: 44 Physician Procedures : CPT4 Code Description Modifier [id number] - WC PHYS SKIN SUB GRAFT TRNK/ARM/LEG ICD-10 Diagnosis Description I87.311 Chronic venous hypertension (idiopathic) with ulcer of right lower extremity L97.812 Non-pressure chronic ulcer of other part of  right lower leg with fat layer exposed S81.811D Laceration without foreign body, right lower leg, subsequent encounter Quantity: 1 Electronic Signature(s) Signed: 07/01/2019 5:27:06 PM By: Levan Hurst RN, BSN Signed: 07/01/2019 5:33:20 PM By: Linton Ham MD Entered By: Levan Hurst on 07/01/2019 14:03:24

## 2019-07-01 NOTE — Telephone Encounter (Signed)
Received email from patient 07/01/19  Dr. [name],   I have decided to stop taking the Ofev.   The side effects are very unpleasant and have begun to control my life!  I will not take any meds to help the side effects.   We tried!   [name]  MR please advise

## 2019-07-01 NOTE — Progress Notes (Signed)
[name] ([id number]) Visit Report for 07/01/2019 Arrival Information Details Patient Name: Date of Service: [name], [name] PE P. 07/01/2019 10:30 A M Medical Record Number: [id number] Patient Account Number: [id number] Date of Birth/Sex: Treating RN: 08-31-1934 (85 y.o. [name], [name] Primary Care Tullio Chausse: [name] Other Clinician: Referring Ardith Test: Treating Louisa Favaro/Extender: Louis Meckel in Treatment: 10 Visit Information History Since Last Visit Added or deleted any medications: No Patient Arrived: Ambulatory Any new allergies or adverse reactions: No Arrival Time: 10:28 Had a fall or experienced change in No Accompanied By: self activities of daily living that may affect Transfer Assistance: None risk of falls: Patient Identification Verified: Yes Signs or symptoms of abuse/neglect since last visito No Secondary Verification Process Completed: Yes Hospitalized since last visit: No Patient Requires Transmission-Based Precautions: No Implantable device outside of the clinic excluding No Patient Has Alerts: No cellular tissue based products placed in the center since last visit: Has Dressing in Place as Prescribed: Yes Has Compression in Place as Prescribed: Yes Pain Present Now: No Electronic Signature(s) Signed: 07/01/2019 4:55:42 PM By: Kela Millin Entered By: Kela Millin on 07/01/2019 10:28:36 -------------------------------------------------------------------------------- Compression Therapy Details Patient Name: Date of Service: [name], [name] 07/01/2019 10:30 A M Medical Record Number: [id number] Patient Account Number: [id number] Date of Birth/Sex: Treating RN: 11/23/1934 (85 y.o. [name] Primary Care Tyshell Ramberg: [name] Other Clinician: Referring Patrick Salemi: Treating Charlsey Moragne/Extender: Louis Meckel in Treatment: 10 Compression Therapy Performed for Wound Assessment: Wound #1  Right,Lateral Lower Leg Performed By: Clinician Levan Hurst, RN Compression Type: Three Layer Post Procedure Diagnosis Same as Pre-procedure Electronic Signature(s) Signed: 07/01/2019 5:27:06 PM By: Levan Hurst RN, BSN Entered By: Levan Hurst on 07/01/2019 11:30:27 -------------------------------------------------------------------------------- Compression Therapy Details Patient Name: Date of Service: [name], [name] 07/01/2019 10:30 A M Medical Record Number: [id number] Patient Account Number: [id number] Date of Birth/Sex: Treating RN: November 20, 1934 (85 y.o. [name] Primary Care Jolon Degante: [name] Other Clinician: Referring Lino Wickliff: Treating Ellakate Gonsalves/Extender: Louis Meckel in Treatment: 10 Compression Therapy Performed for Wound Assessment: Wound #2 Right,Medial Lower Leg Performed By: Clinician Levan Hurst, RN Compression Type: Three Layer Post Procedure Diagnosis Same as Pre-procedure Electronic Signature(s) Signed: 07/01/2019 5:27:06 PM By: Levan Hurst RN, BSN Entered By: Levan Hurst on 07/01/2019 11:30:27 -------------------------------------------------------------------------------- Lower Extremity Assessment Details Patient Name: Date of Service: [name], [name] 07/01/2019 10:30 A M Medical Record Number: [id number] Patient Account Number: [id number] Date of Birth/Sex: Treating RN: 11-12-1934 (85 y.o. [name] Primary Care Joci Dress: [name] Other Clinician: Referring Lennart Gladish: Treating Jaysean Manville/Extender: Herma Ard, Moses Manners in Treatment: 10 Edema Assessment Assessed: Shirlyn Goltz: No] [Right: No] Edema: [Left: No] [Right: Yes] Calf Left: Right: Point of Measurement: cm From Medial Instep 33 cm 30.5 cm Ankle Left: Right: Point of Measurement: cm From Medial Instep 23.5 cm 21 cm Vascular Assessment Pulses: Dorsalis Pedis Palpable: [Left:Yes] [Right:Yes] Electronic  Signature(s) Signed: 07/01/2019 4:55:42 PM By: Kela Millin Entered By: Kela Millin on 07/01/2019 10:31:43 -------------------------------------------------------------------------------- Multi Wound Chart Details Patient Name: Date of Service: [name], [name] 07/01/2019 10:30 A M Medical Record Number: [id number] Patient Account Number: [id number] Date of Birth/Sex: Treating RN: [birth year] (85 y.o. [name] Primary Care Tayah Idrovo: [name] Other Clinician: Referring Dorsie Burich: Treating Pammy Vesey/Extender: Louis Meckel in Treatment: 10 Vital Signs Height(in): 64 Pulse(bpm): 93 Weight(lbs): 125 Blood Pressure(mmHg): 123/74 Body Mass Index(BMI): 21 Temperature(F):  97.8 Respiratory Rate(breaths/min): 19 Photos: [1:No Photos Right, Lateral Lower Leg] [2:No Photos Right, Medial Lower Leg] [3:No Photos Left, Medial Lower Leg] Wound Location: [1:Trauma] [2:Trauma] [3:Trauma] Wounding Event: [1:Venous Leg Ulcer] [2:Venous Leg Ulcer] [3:Skin Tear] Primary Etiology: [1:Cataracts, Confinement Anxiety] [2:Cataracts, Confinement Anxiety] [3:Cataracts, Confinement Anxiety] Comorbid History: [1:02/27/2019] [2:02/17/2019] [3:07/01/2019] Date Acquired: [1:10] [2:10] [3:0] Weeks of Treatment: [1:Open] [2:Open] [3:Open] Wound Status: [1:0.8x0.8x0.2] [2:0.5x0.4x0.1] [3:1x0.2x0.1] Measurements L x W x D (cm) [1:0.503] [2:0.157] [3:0.157] A (cm) : rea [1:0.101] [2:0.016] [3:0.016] Volume (cm) : [1:83.10%] [2:33.50%] [3:N/A] % Reduction in A rea: [1:66.00%] [2:33.30%] [3:N/A] % Reduction in Volume: [1:Full Thickness Without Exposed] [2:Full Thickness Without Exposed] [3:Partial Thickness] Classification: [1:Support Structures Medium] [2:Support Structures Small] [3:Small] Exudate Amount: [1:Serosanguineous] [2:Serosanguineous] [3:Serosanguineous] Exudate Type: [1:red, brown] [2:red, brown] [3:red, brown] Exudate Color: [1:Flat and Intact] [2:Flat and  Intact] [3:Distinct, outline attached] Wound Margin: [1:Medium (34-66%)] [2:Large (67-100%)] [3:Large (67-100%)] Granulation Amount: [1:Red] [2:Red] [3:Pink] Granulation Quality: [1:Medium (34-66%)] [2:Small (1-33%)] [3:None Present (0%)] Necrotic Amount: [1:Fat Layer (Subcutaneous Tissue)] [2:Fat Layer (Subcutaneous Tissue)] [3:Fascia: No] Exposed Structures: [1:Exposed: Yes Fascia: No Tendon: No Muscle: No Joint: No Bone: No Small (1-33%)] [2:Exposed: Yes Fascia: No Tendon: No Muscle: No Joint: No Bone: No Small (1-33%)] [3:Fat Layer (Subcutaneous Tissue) Exposed: No Tendon: No Muscle: No Joint: No Bone: No  None] Epithelialization: [1:Cellular or Tissue Based Product] [2:Cellular or Tissue Based Product] [3:N/A] Procedures Performed: [1:Compression Therapy] [2:Compression [place] Treatment Notes Electronic Signature(s) Signed: 07/01/2019 5:27:06 PM By: Levan Hurst RN, BSN Signed: 07/01/2019 5:33:20 PM By: Linton Ham MD Entered By: Linton Ham on 07/01/2019 12:01:06 -------------------------------------------------------------------------------- Multi-Disciplinary Care Plan Details Patient Name: Date of Service: [name], [name] 07/01/2019 10:30 A M Medical Record Number: [id number] Patient Account Number: [id number] Date of Birth/Sex: Treating RN: 1934-11-02 (85 y.o. [name] Primary Care Tupac Jeffus: [name] Other Clinician: Referring Eliazer Hemphill: Treating Jaylun Fleener/Extender: Herma Ard, Moses Manners in Treatment: 10 Active Inactive Wound/Skin Impairment Nursing Diagnoses: Impaired tissue integrity Goals: Patient/caregiver will verbalize understanding of skin care regimen Date Initiated: 07/01/2019 Target Resolution Date: 08/02/2019 Goal Status: Active Ulcer/skin breakdown will have a volume reduction of 30% by week 4 Date Initiated: 04/19/2019 Date Inactivated: 07/01/2019 Target Resolution Date: 06/28/2019 Goal Status: Met Interventions: Provide  education on ulcer and skin care Notes: Electronic Signature(s) Signed: 07/01/2019 5:27:06 PM By: Levan Hurst RN, BSN Entered By: Levan Hurst on 07/01/2019 11:26:29 -------------------------------------------------------------------------------- Pain Assessment Details Patient Name: Date of Service: [name], [name] 07/01/2019 10:30 A M Medical Record Number: [id number] Patient Account Number: [id number] Date of Birth/Sex: Treating RN: 1934-06-06 (85 y.o. [name] Primary Care Gaius Ishaq: [name] Other Clinician: Referring Refugio Vandevoorde: Treating Cyris Maalouf/Extender: Herma Ard, Moses Manners in Treatment: 10 Active Problems Location of Pain Severity and Description of Pain Patient Has Paino No Site Locations Pain Management and Medication Current Pain Management: Electronic Signature(s) Signed: 07/01/2019 4:55:42 PM By: Kela Millin Entered By: Kela Millin on 07/01/2019 10:29:03 -------------------------------------------------------------------------------- Patient/Caregiver Education Details Patient Name: Date of Service: [name], [name] 5/3/2021andnbsp10:30 A M Medical Record Number: [id number] Patient Account Number: [id number] Date of Birth/Gender: Treating RN: 1934-03-20 (85 y.o. [name] Primary Care Physician: [name] Other Clinician: Referring Physician: Treating Physician/Extender: Louis Meckel in Treatment: 10 Education Assessment Education Provided To: Patient Education Topics Provided Wound/Skin Impairment: Methods: Explain/Verbal Responses: State content correctly [employer]) Signed: 07/01/2019 5:27:06 PM By: Levan Hurst RN, BSN Entered By: Levan Hurst on 07/01/2019 11:26:46 --------------------------------------------------------------------------------  Wound Assessment Details Patient Name: Date of Service: [name], [name] PE P. 07/01/2019 10:30 A M Medical  Record Number: [id number] Patient Account Number: [id number] Date of Birth/Sex: Treating RN: 1934-10-04 (85 y.o. [name] Primary Care Shaheed Schmuck: [name] Other Clinician: Referring Gust Eugene: Treating Adrena Nakamura/Extender: Louis Meckel in Treatment: 10 Wound Status Wound Number: 1 Primary Etiology: Venous Leg Ulcer Wound Location: Right, Lateral Lower Leg Wound Status: Open Wounding Event: Trauma Comorbid History: Cataracts, Confinement Anxiety Date Acquired: 02/27/2019 Weeks Of Treatment: 10 Clustered Wound: No Wound Measurements Length: (cm) 0.8 Width: (cm) 0.8 Depth: (cm) 0.2 Area: (cm) 0.503 Volume: (cm) 0.101 % Reduction in Area: 83.1% % Reduction in Volume: 66% Epithelialization: Small (1-33%) Tunneling: No Undermining: No Wound Description Classification: Full Thickness Without Exposed Support Structures Wound Margin: Flat and Intact Exudate Amount: Medium Exudate Type: Serosanguineous Exudate Color: red, brown Foul Odor After Cleansing: No Slough/Fibrino Yes Wound Bed Granulation Amount: Medium (34-66%) Exposed Structure Granulation Quality: Red Fascia Exposed: No Necrotic Amount: Medium (34-66%) Fat Layer (Subcutaneous Tissue) Exposed: Yes Necrotic Quality: Adherent Slough Tendon Exposed: No Muscle Exposed: No Joint Exposed: No Bone Exposed: No Electronic Signature(s) Signed: 07/01/2019 4:55:42 PM By: Kela Millin Entered By: Kela Millin on 07/01/2019 10:36:44 -------------------------------------------------------------------------------- Wound Assessment Details Patient Name: Date of Service: [name], [name] 07/01/2019 10:30 A M Medical Record Number: [id number] Patient Account Number: [id number] Date of Birth/Sex: Treating RN: 15-Apr-1934 (85 y.o. [name] Primary Care Jaishaun Mcnab: [name] Other Clinician: Referring Kedra Mcglade: Treating Fredrich Cory/Extender: Louis Meckel in Treatment: 10 Wound Status Wound Number: 2 Primary Etiology: Venous Leg Ulcer Wound Location: Right, Medial Lower Leg Wound Status: Open Wounding Event: Trauma Comorbid History: Cataracts, Confinement Anxiety Date Acquired: 02/17/2019 Weeks Of Treatment: 10 Clustered Wound: No Wound Measurements Length: (cm) 0.5 Width: (cm) 0.4 Depth: (cm) 0.1 Area: (cm) 0.157 Volume: (cm) 0.016 % Reduction in Area: 33.5% % Reduction in Volume: 33.3% Epithelialization: Small (1-33%) Tunneling: No Undermining: No Wound Description Classification: Full Thickness Without Exposed Support Structures Wound Margin: Flat and Intact Exudate Amount: Small Exudate Type: Serosanguineous Exudate Color: red, brown Foul Odor After Cleansing: No Slough/Fibrino Yes Wound Bed Granulation Amount: Large (67-100%) Exposed Structure Granulation Quality: Red Fascia Exposed: No Necrotic Amount: Small (1-33%) Fat Layer (Subcutaneous Tissue) Exposed: Yes Necrotic Quality: Adherent Slough Tendon Exposed: No Muscle Exposed: No Joint Exposed: No Bone Exposed: No Electronic Signature(s) Signed: 07/01/2019 4:55:42 PM By: Kela Millin Entered By: Kela Millin on 07/01/2019 10:37:19 -------------------------------------------------------------------------------- Wound Assessment Details Patient Name: Date of Service: [name], [name] 07/01/2019 10:30 A M Medical Record Number: [id number] Patient Account Number: [id number] Date of Birth/Sex: Treating RN: 01/04/1935 (85 y.o. [name] Primary Care Margarette Vannatter: [name] Other Clinician: Referring Minnetta Sandora: Treating Saniya Tranchina/Extender: Louis Meckel in Treatment: 10 Wound Status Wound Number: 3 Primary Etiology: Skin Tear Wound Location: Left, Medial Lower Leg Wound Status: Open Wounding Event: Trauma Comorbid History: Cataracts, Confinement Anxiety Date Acquired: 07/01/2019 Weeks Of Treatment:  0 Clustered Wound: No Wound Measurements Length: (cm) 1 Width: (cm) 0.2 Depth: (cm) 0.1 Area: (cm) 0.157 Volume: (cm) 0.016 % Reduction in Area: % Reduction in Volume: Epithelialization: None Tunneling: No Undermining: No Wound Description Classification: Partial Thickness Wound Margin: Distinct, outline attached Exudate Amount: Small Exudate Type: Serosanguineous Exudate Color: red, brown Foul Odor After Cleansing: No Slough/Fibrino No Wound Bed Granulation Amount: Large (67-100%) Exposed Structure Granulation Quality: Pink Fascia Exposed: No Necrotic Amount: None  Present (0%) Fat Layer (Subcutaneous Tissue) Exposed: No Tendon Exposed: No Muscle Exposed: No Joint Exposed: No Bone Exposed: No Electronic Signature(s) Signed: 07/01/2019 4:55:42 PM By: Kela Millin Entered By: Kela Millin on 07/01/2019 10:35:46 -------------------------------------------------------------------------------- Vitals Details Patient Name: Date of Service: [name], [name] 07/01/2019 10:30 A M Medical Record Number: [id number] Patient Account Number: [id number] Date of Birth/Sex: Treating RN: 07/26/1934 (86 y.o. [name] Primary Care Randee Upchurch: [name] Other Clinician: Referring Nekoda Chock: Treating Kinzly Pierrelouis/Extender: Louis Meckel in Treatment: 10 Vital Signs Time Taken: 10:28 Temperature (F): 97.8 Height (in): 64 Pulse (bpm): 93 Weight (lbs): 125 Respiratory Rate (breaths/min): 19 Body Mass Index (BMI): 21.5 Blood Pressure (mmHg): 123/74 Reference Range: 80 - 120 mg / dl Electronic Signature(s) Signed: 07/01/2019 4:55:42 PM By: Kela Millin Entered By: Kela Millin on 07/01/2019 10:28:56

## 2019-07-02 NOTE — Telephone Encounter (Signed)
MR to clarify, do you just plan on calling her in June, or do you want [place] to schedule a televisit with her in June? Also, it does not appear that CT has been uploaded to [facility].

## 2019-07-02 NOTE — Telephone Encounter (Signed)
lmtcb to schedule televisit with pt.   Sent message to pt advising her to call back and schedule televisit.

## 2019-07-02 NOTE — Telephone Encounter (Signed)
This is totally understandable and respectable.  Thank you for trying.  I agree that we tried and gave it a good try.  Plan -Triage please find out if the October 2020 high-resolution CT chest disc from [facility] is now uploaded in the canopy system  -Please advise patient that I would like to have a follow-up visit with the patient a 15-minute telephone call would suffice -sometime in June 2021.  This would be just to catch up

## 2019-07-02 NOTE — Telephone Encounter (Signed)
1. Thanks for the updaet on the PACS - wilk eep an eye  2. I want at formal 15 min tele or video visit scheduled. Followup ILD

## 2019-07-04 ENCOUNTER — Telehealth: Payer: Self-pay | Admitting: Internal Medicine

## 2019-07-04 NOTE — Telephone Encounter (Signed)
Patient is returning phone call. Patient phone number is [phone].

## 2019-07-04 NOTE — Telephone Encounter (Signed)
Dr. Geoffery Spruce please advise   Patient states that she stopped taking 2 tablets of her Ofev and wondered if she should take one 150 mg capsule a day to see if that helps. States the diarrhea has been awful. She would like to get a response about this before making appt. Please advise.

## 2019-07-04 NOTE — Telephone Encounter (Signed)
LMTCB x1 for pt.  

## 2019-07-04 NOTE — Telephone Encounter (Signed)
Spoke with pt. She is aware of Dr. Golden Pop recommendations. Nothing further was needed.

## 2019-07-04 NOTE — Telephone Encounter (Signed)
Recommend total bowel rest without any nintedanib for at least 2 weeks Then we can rechallenge with 150 mg once daily for 2 weeks  And if she is doing well with that then can go to 150 mg twice a day

## 2019-07-08 ENCOUNTER — Encounter (HOSPITAL_BASED_OUTPATIENT_CLINIC_OR_DEPARTMENT_OTHER): Payer: Medicare HMO | Admitting: Internal Medicine

## 2019-07-08 DIAGNOSIS — E785 Hyperlipidemia, unspecified: Secondary | ICD-10-CM | POA: Diagnosis not present

## 2019-07-08 DIAGNOSIS — I87311 Chronic venous hypertension (idiopathic) with ulcer of right lower extremity: Secondary | ICD-10-CM | POA: Diagnosis not present

## 2019-07-08 DIAGNOSIS — I10 Essential (primary) hypertension: Secondary | ICD-10-CM | POA: Diagnosis not present

## 2019-07-08 DIAGNOSIS — S81811A Laceration without foreign body, right lower leg, initial encounter: Secondary | ICD-10-CM | POA: Diagnosis not present

## 2019-07-08 DIAGNOSIS — X58XXXA Exposure to other specified factors, initial encounter: Secondary | ICD-10-CM | POA: Diagnosis not present

## 2019-07-08 DIAGNOSIS — Z85828 Personal history of other malignant neoplasm of skin: Secondary | ICD-10-CM | POA: Diagnosis not present

## 2019-07-08 DIAGNOSIS — L97812 Non-pressure chronic ulcer of other part of right lower leg with fat layer exposed: Secondary | ICD-10-CM | POA: Diagnosis not present

## 2019-07-08 DIAGNOSIS — Z7952 Long term (current) use of systemic steroids: Secondary | ICD-10-CM | POA: Diagnosis not present

## 2019-07-08 DIAGNOSIS — J849 Interstitial pulmonary disease, unspecified: Secondary | ICD-10-CM | POA: Diagnosis not present

## 2019-07-09 NOTE — Progress Notes (Signed)
JOLEA, DOLLE ([id number]) Visit Report for 07/08/2019 SuperBill Details Patient Name: Date of Service: Juliette Mangle, [place] PE P. 07/08/2019 Medical Record Number: [id number] Patient Account Number: [id number] Date of Birth/Sex: Treating RN: February 09, 1935 (85 y.o. [name] Primary Care Provider: [name] Other Clinician: Referring Provider: Treating Provider/Extender: Louis Meckel in Treatment: 11 Diagnosis Coding ICD-10 Codes Code Description I87.311 Chronic venous hypertension (idiopathic) with ulcer of right lower extremity L97.812 Non-pressure chronic ulcer of other part of right lower leg with fat layer exposed S81.811D Laceration without foreign body, right lower leg, subsequent encounter Z92.241 Personal history of systemic steroid therapy Facility Procedures CPT4 Code Description Modifier Quantity [id number] (Facility Use Only) [phone] - APPLY MULTLAY COMPRS LWR RT LEG 1 Electronic Signature(s) Signed: 07/09/2019 8:11:21 AM By: Linton Ham MD Signed: 07/09/2019 5:14:55 PM By: Kela Millin Entered By: Kela Millin on 07/08/2019 11:08:36

## 2019-07-09 NOTE — Progress Notes (Signed)
Terry, Conway ([id number]) Visit Report for 07/08/2019 Arrival Information Details Patient Name: Date of Service: [name], [place] PE P. 07/08/2019 10:30 A M Medical Record Number: [id number] Patient Account Number: [id number] Date of Birth/Sex: Treating RN: [birth year] (85 y.o. [name], [name] Primary Care [name]: [name] Other Clinician: Referring [place]: Treating [name]/Extender: [name] in Treatment: 11 Visit Information History Since Last Visit Added or deleted any medications: No Patient Arrived: Ambulatory Any new allergies or adverse reactions: No Arrival Time: 10:46 Had a fall or experienced change in No Accompanied By: self activities of daily living that may affect Transfer Assistance: None risk of falls: Patient Identification Verified: Yes Signs or symptoms of abuse/neglect since last visito No Secondary Verification Process Completed: Yes Hospitalized since last visit: No Patient Requires Transmission-Based Precautions: No Implantable device outside of the clinic excluding No Patient Has Alerts: No cellular tissue based products placed in the center since last visit: Has Dressing in Place as Prescribed: Yes Has Compression in Place as Prescribed: Yes Pain Present Now: No Electronic Signature(s) Signed: 07/09/2019 5:14:55 PM By: Kela Millin Entered By: Kela Millin on 07/08/2019 10:47:21 -------------------------------------------------------------------------------- Compression Therapy Details Patient Name: Date of Service: [name], [name] 07/08/2019 10:30 A M Medical Record Number: [id number] Patient Account Number: [id number] Date of Birth/Sex: Treating RN: [birth year] (85 y.o. [name] Primary Care [name]: [name] Other Clinician: Referring [name]: Treating Khalessi Blough/Extender: [name] in Treatment: 11 Compression Therapy Performed for Wound Assessment:  Wound #1 Right,Lateral Lower Leg Performed By: Clinician Kela Millin, RN Compression Type: Three Layer Electronic Signature(s) Signed: 07/09/2019 5:14:55 PM By: Kela Millin Entered By: Kela Millin on 07/08/2019 11:04:32 -------------------------------------------------------------------------------- Compression Therapy Details Patient Name: Date of Service: [name], [name] 07/08/2019 10:30 A M Medical Record Number: [id number] Patient Account Number: [id number] Date of Birth/Sex: Treating RN: [birth year] (85 y.o. [name] Primary Care Bellamarie Pflug: [name] Other Clinician: Referring [name]: Treating [name]/Extender: [name] in Treatment: 11 Compression Therapy Performed for Wound Assessment: Wound #2 Right,Medial Lower Leg Performed By: Clinician Kela Millin, RN Compression Type: Three Layer Electronic Signature(s) Signed: 07/09/2019 5:14:55 PM By: Kela Millin Entered By: Kela Millin on 07/08/2019 11:04:47 -------------------------------------------------------------------------------- Encounter Discharge Information Details Patient Name: Date of Service: [name], [name] 07/08/2019 10:30 A M Medical Record Number: [id number] Patient Account Number: [id number] Date of Birth/Sex: Treating RN: [name] (85 y.o. [name] Primary Care [name]: [name] Other Clinician: Referring [name]: Treating [name]/Extender: [name] in Treatment: 11 Encounter Discharge Information Items Discharge Condition: Stable Ambulatory Status: Ambulatory Discharge Destination: Home Transportation: Private Auto Accompanied By: self Schedule Follow-up Appointment: Yes Clinical Summary of Care: Patient Declined Electronic Signature(s) Signed: 07/09/2019 5:14:55 PM By: Kela Millin Entered By: Kela Millin on 07/08/2019  11:07:07 -------------------------------------------------------------------------------- Patient/Caregiver Education Details Patient Name: Date of Service: [name], [name] 5/10/2021andnbsp10:30 A M Medical Record Number: [id number] Patient Account Number: [id number] Date of Birth/Gender: Treating RN: [birth year] (85 y.o. [name] Primary Care Physician: [name] Other Clinician: Referring Physician: Treating Physician/Extender: [name] in Treatment: 11 Education Assessment Education Provided To: Patient Education Topics Provided Wound/Skin Impairment: Handouts: Caring for Your Ulcer Methods: Explain/Verbal Responses: State content correctly Electronic Signature(s) Signed: 07/09/2019 5:14:55 PM By: Kela Millin Entered By: Kela Millin on 07/08/2019 11:06:20 -------------------------------------------------------------------------------- Wound Assessment Details Patient Name: Date of Service: [name], [name]  07/08/2019 10:30 A M Medical Record Number: [id number] Patient Account Number: [id number] Date of Birth/Sex: Treating RN: [birth year] (85 y.o. [name] Primary Care Dontae Minerva: [name] Other Clinician: Referring [name]: Treating [name]/Extender: [name] in Treatment: 11 Wound Status Wound Number: 1 Primary Etiology: Venous Leg Ulcer Wound Location: Right, Lateral Lower Leg Wound Status: Open Wounding Event: Trauma Comorbid History: Cataracts, Confinement Anxiety Date Acquired: 02/27/2019 Weeks Of Treatment: 11 Clustered Wound: No Wound Measurements Length: (cm) 0.8 Width: (cm) 0.8 Depth: (cm) 0.2 Area: (cm) 0.503 Volume: (cm) 0.101 % Reduction in Area: 83.1% % Reduction in Volume: 66% Epithelialization: Small (1-33%) Tunneling: No Undermining: No Wound Description Classification: Full Thickness Without Exposed Support Structures Wound Margin: Flat  and Intact Exudate Amount: Medium Exudate Type: Serosanguineous Exudate Color: red, brown Foul Odor After Cleansing: No Slough/Fibrino Yes Wound Bed Granulation Amount: Medium (34-66%) Exposed Structure Granulation Quality: Red Fascia Exposed: No Necrotic Amount: Medium (34-66%) Fat Layer (Subcutaneous Tissue) Exposed: Yes Necrotic Quality: Adherent Slough Tendon Exposed: No Muscle Exposed: No Joint Exposed: No Bone Exposed: No Treatment Notes Wound #1 (Right, Lateral Lower Leg) 1. Cleanse With Wound Cleanser Soap and water 2. Periwound Care Moisturizing lotion 3. Primary Dressing Applied Other primary dressing (specifiy in notes) 4. Secondary Dressing ABD Pad 6. Support Layer Applied 3 layer compression wrap Notes antibiotic ointment to anterior upper leg per MD. reinforced adaptic/steri-strips Electronic Signature(s) Signed: 07/09/2019 5:14:55 PM By: Kela Millin Entered By: Kela Millin on 07/08/2019 10:48:32 -------------------------------------------------------------------------------- Wound Assessment Details Patient Name: Date of Service: [name], [name] 07/08/2019 10:30 A M Medical Record Number: [id number] Patient Account Number: [id number] Date of Birth/Sex: Treating RN: [birth year] (86 y.o. [name] Primary Care Selassie Spatafore: [name] Other Clinician: Referring Muhannad Bignell: Treating Bonnita Newby/Extender: [name] in Treatment: 11 Wound Status Wound Number: 2 Primary Etiology: Venous Leg Ulcer Wound Location: Right, Medial Lower Leg Wound Status: Open Wounding Event: Trauma Comorbid History: Cataracts, Confinement Anxiety Date Acquired: 02/17/2019 Weeks Of Treatment: 11 Clustered Wound: No Wound Measurements Length: (cm) 0.5 Width: (cm) 0.4 Depth: (cm) 0.1 Area: (cm) 0.157 Volume: (cm) 0.016 % Reduction in Area: 33.5% % Reduction in Volume: 33.3% Epithelialization: Small (1-33%) Tunneling:  No Undermining: No Wound Description Classification: Full Thickness Without Exposed Support Structures Wound Margin: Flat and Intact Exudate Amount: Small Exudate Type: Serosanguineous Exudate Color: red, brown Foul Odor After Cleansing: No Slough/Fibrino Yes Wound Bed Granulation Amount: Large (67-100%) Exposed Structure Granulation Quality: Red Fascia Exposed: No Necrotic Amount: Small (1-33%) Fat Layer (Subcutaneous Tissue) Exposed: Yes Necrotic Quality: Adherent Slough Tendon Exposed: No Muscle Exposed: No Joint Exposed: No Bone Exposed: No Treatment Notes Wound #2 (Right, Medial Lower Leg) 1. Cleanse With Wound Cleanser Soap and water 2. Periwound Care Moisturizing lotion 3. Primary Dressing Applied Other primary dressing (specifiy in notes) 4. Secondary Dressing ABD Pad 6. Support Layer Applied 3 layer compression wrap Notes antibiotic ointment to anterior upper leg per MD. reinforced adaptic/steri-strips Electronic Signature(s) Signed: 07/09/2019 5:14:55 PM By: Kela Millin Entered By: Kela Millin on 07/08/2019 10:48:41 -------------------------------------------------------------------------------- Wound Assessment Details Patient Name: Date of Service: [name], [name] 07/08/2019 10:30 A M Medical Record Number: [id number] Patient Account Number: [id number] Date of Birth/Sex: Treating RN: [birth year] (85 y.o. [name] Primary Care Vonnetta Akey: [name] Other Clinician: Referring Kery Haltiwanger: Treating Johnthan Axtman/Extender: [name] in Treatment: 11 Wound Status Wound Number: 3 Primary Etiology: Skin Tear Wound Location: Left, Medial Lower Leg Wound  Status: Open Wounding Event: Trauma Comorbid History: Cataracts, Confinement Anxiety Date Acquired: 07/01/2019 Weeks Of Treatment: 1 Clustered Wound: No Wound Measurements Length: (cm) 1 Width: (cm) 0.2 Depth: (cm) 0.1 Area: (cm) 0.157 Volume: (cm)  0.016 % Reduction in Area: 0% % Reduction in Volume: 0% Epithelialization: None Tunneling: No Undermining: No Wound Description Classification: Partial Thickness Wound Margin: Distinct, outline attached Exudate Amount: Small Exudate Type: Serosanguineous Exudate Color: red, brown Foul Odor After Cleansing: No Slough/Fibrino No Wound Bed Granulation Amount: Large (67-100%) Exposed Structure Granulation Quality: Pink Fascia Exposed: No Necrotic Amount: None Present (0%) Fat Layer (Subcutaneous Tissue) Exposed: No Tendon Exposed: No Muscle Exposed: No Joint Exposed: No Bone Exposed: No Electronic Signature(s) Signed: 07/09/2019 5:14:55 PM By: Kela Millin Entered By: Kela Millin on 07/08/2019 10:48:49 -------------------------------------------------------------------------------- Vitals Details Patient Name: Date of Service: [name], [name] 07/08/2019 10:30 A M Medical Record Number: [id number] Patient Account Number: [id number] Date of Birth/Sex: Treating RN: [birth year] Conway (85 y.o. [name] Primary Care Rodd Heft: [name] Other Clinician: Referring Hayla Hinger: Treating Yaacov Koziol/Extender: [name] in Treatment: 11 Vital Signs Time Taken: 10:45 Temperature (F): 98.5 Height (in): 64 Pulse (bpm): 100 Weight (lbs): 125 Respiratory Rate (breaths/min): 19 Body Mass Index (BMI): 21.5 Blood Pressure (mmHg): 147/85 Reference Range: 80 - 120 mg / dl Electronic Signature(s) Signed: 07/09/2019 5:14:55 PM By: Kela Millin Entered By: Kela Millin on 07/08/2019 10:47:43

## 2019-07-13 DIAGNOSIS — J849 Interstitial pulmonary disease, unspecified: Secondary | ICD-10-CM | POA: Diagnosis not present

## 2019-07-15 ENCOUNTER — Ambulatory Visit
Admission: RE | Admit: 2019-07-15 | Discharge: 2019-07-15 | Disposition: A | Discharge status: Home / Self Care | Payer: Self-pay | Source: Ambulatory Visit | Attending: Internal Medicine | Admitting: Internal Medicine

## 2019-07-15 ENCOUNTER — Encounter (HOSPITAL_BASED_OUTPATIENT_CLINIC_OR_DEPARTMENT_OTHER): Payer: Medicare HMO | Admitting: Internal Medicine

## 2019-07-15 DIAGNOSIS — E785 Hyperlipidemia, unspecified: Secondary | ICD-10-CM | POA: Diagnosis not present

## 2019-07-15 DIAGNOSIS — J849 Interstitial pulmonary disease, unspecified: Secondary | ICD-10-CM

## 2019-07-15 DIAGNOSIS — Z7952 Long term (current) use of systemic steroids: Secondary | ICD-10-CM | POA: Diagnosis not present

## 2019-07-15 DIAGNOSIS — L97812 Non-pressure chronic ulcer of other part of right lower leg with fat layer exposed: Secondary | ICD-10-CM | POA: Diagnosis not present

## 2019-07-15 DIAGNOSIS — S81811A Laceration without foreign body, right lower leg, initial encounter: Secondary | ICD-10-CM | POA: Diagnosis not present

## 2019-07-15 DIAGNOSIS — I10 Essential (primary) hypertension: Secondary | ICD-10-CM | POA: Diagnosis not present

## 2019-07-15 DIAGNOSIS — Z85828 Personal history of other malignant neoplasm of skin: Secondary | ICD-10-CM | POA: Diagnosis not present

## 2019-07-15 DIAGNOSIS — X58XXXA Exposure to other specified factors, initial encounter: Secondary | ICD-10-CM | POA: Diagnosis not present

## 2019-07-15 DIAGNOSIS — I87311 Chronic venous hypertension (idiopathic) with ulcer of right lower extremity: Secondary | ICD-10-CM | POA: Diagnosis not present

## 2019-07-15 NOTE — Progress Notes (Signed)
[name], [name] ([id number]) Visit Report for 07/15/2019 HPI Details Patient Name: Date of Service: [name], [place] PE P. 07/15/2019 10:45 A M Medical Record Number: [id number] Patient Account Number: [id number] Date of Birth/Sex: Treating RN: 02/28/1935 (85 y.o. [name] Primary Care Provider: [name] Other Clinician: Referring Provider: Treating Provider/Extender: Herma Ard, Moses Manners in Treatment: 12 History of Present Illness HPI Description: ADMISSION 04/19/2019 This is an 85 year old woman who has chronic interstitial lung disease on prednisone 10 mg a day. T owards the end of December she hit her lateral right leg on a chair. She was seen in urgent care and the area was Steri-Stripped however this did not hold together. Sometime after this traumatized the medial part of her right leg. She has some chronic edema in her lower legs probably is a secondary phenomenon to chronic venous insufficiency. She also takes chronic steroids. He saw her primary physician who is Dr. [name] at [place] and was given doxycycline on 2/8 I believe she has completed that. She is here for review of nonhealing areas on the right lateral greater than right medial calf Past medical history; includes interstitial lung disease on prednisone 10 mg and is just started Ofev. She has a history of basal cell skin cancer, hyperlipidemia. ABI in our clinic was 1.04 in the right 2/26; patient with chronic venous insufficiency and interstitial lung disease on chronic prednisone. She has a wound on her right lateral and right medial lower leg on the right which were separate incidents of trauma. These wounds started in December. She had very significant debris on both of the wounds last week which required debridement they require debridement again today. We have been using Iodoflex 3/4; the patient has two wounds on her right lower leg one laterally and one medially in the setting of chronic  venous hypertension and resultant separate incidences of trauma. The wounds actually look better in terms of surface. We have been using Iodoflex, changed to [facility] classic today 3/11; she complained of pain almost like lancinating pain in the wound on the right lateral leg. This was new this week and she attributed this to the fact that we changed her to [facility]. Previously everything was well tolerated including at least 2 weeks of Sorbact. She has a $503 per application co-pay for Apligraf. She expressed the fact that it they would be able to pay this if it was felt to be necessary 3/19; patient is complaining about the facility charge coming here. We listened to this for a while. We put polymen on this last week after she experience pain with Hydrofera Blue she seems to tolerate this better. She has an area on the right lateral and right medial leg in the setting of chronic venous insufficiency and chronic prednisone use. Although her overall surface area is not any better the wound surface certainly looks a lot better 3/26; patient has 2 traumatic areas in the setting of chronic venous insufficiency. One is on the right lateral and a separate incidence on the right medial lower extremity. These are not that large and the surface of them looks adequate however very slow to come down in size. 05/31/2019 upon evaluation today patient appears to be doing decently well in regard to her wounds today. Both are measuring slightly smaller which is good news. Fortunately there is no signs of active infection at this time. Subsequently I do believe that she is a candidate for proceeding with the Apligraf at  this point we did get approval and she does want to proceed as such. Fortunately there is no signs of active infection at this time. 4/16; patient arrives in clinic today for application of Apligraf #2. The patient was initially very upset and angry about the slow progress of her wound  healing and the effect this is having on her life, her lung function [idiopathic pulmonary fibrosiso] And has caused a deterioration in her pulmonary function test. Although I certainly was willing to listen to the effect is having on her life and her quality of life I told her that I did not think this was causing a deterioration in her pulmonary function tests. She follows with the pulmonary clinic at [facility] and at 1 point in this discussion I did offer to refer her for consultation at the [facility] wound care clinic which she declined. 5/3; the patient's major wound on the right lateral lower leg has contracted in a major way. She still has a smaller area medially on the right. We applied Apligraf #3 today. 5/17; the patient has a very tiny area left on the right anterior lower leg. Everything else is closed over. The area remaining on the right anterior lower leg is too small to consider another Apligraf application. We used moistened collagen under compression today She has traumatized her left anterior mid tibia area. She did this on a stair. There is nothing open here however she has a fairly significant contusion. I am hopeful that this will not open. Patient with chronic venous insufficiency and chronic prednisone induced skin damage [interstitial lung disease] Electronic Signature(s) Signed: 07/15/2019 4:51:55 PM By: Linton Ham MD Entered By: Linton Ham on 07/15/2019 13:07:21 -------------------------------------------------------------------------------- Physical Exam Details Patient Name: Date of Service: [name], [name] 07/15/2019 10:45 A M Medical Record Number: [id number] Patient Account Number: [id number] Date of Birth/Sex: Treating RN: Feb 09, 1935 (85 y.o. [name] Primary Care Provider: [name] Other Clinician: Referring Provider: Treating Provider/Extender: Louis Meckel in Treatment: 12 Constitutional Patient is hypertensive.. Pulse  regular and within target range for patient.[name] Respirations regular, non-labored and within target range.. Temperature is normal and within the target range for the patient.[name] Appears in no distress. Cardiovascular Pedal pulses palpable bilaterally. Integumentary (Hair, Skin) The patient has a contusion on the left anterior lower leg fairly substantial bruising which apparently just happened yesterday. The center part of this looks a bit threatened to me.. Notes Wound exam; the patient only has 1 small remaining open area from the substantial laceration injury on the right lateral lower leg. The area medially has closed over. There is nothing open on the left lateral. Electronic Signature(s) Signed: 07/15/2019 4:51:55 PM By: Linton Ham MD Entered By: Linton Ham on 07/15/2019 13:09:46 -------------------------------------------------------------------------------- Physician Orders Details Patient Name: Date of Service: [name], [name] 07/15/2019 10:45 A M Medical Record Number: [id number] Patient Account Number: [id number] Date of Birth/Sex: Treating RN: January 20, 1935 (85 y.o. [name] Primary Care Provider: [name] Other Clinician: Referring Provider: Treating Provider/Extender: Louis Meckel in Treatment: 12 Verbal / Phone Orders: No Diagnosis Coding ICD-10 Coding Code Description I87.311 Chronic venous hypertension (idiopathic) with ulcer of right lower extremity L97.812 Non-pressure chronic ulcer of other part of right lower leg with fat layer exposed S81.811D Laceration without foreign body, right lower leg, subsequent encounter Z92.241 Personal history of systemic steroid therapy Follow-up Appointments Return Appointment in 1 week. Dressing Change Frequency Wound #1 Right,Lateral Lower Leg  Do not change entire dressing for one week. Skin Barriers/Peri-Wound Care Moisturizing lotion Wound Cleansing May shower with protection. - use  a cast protector. can get at [facility] or [facility] Primary Wound Dressing Wound #1 Right,Lateral Lower Leg Collagen - moisten with hydrogel Secondary Dressing Wound #1 Right,Lateral Lower Leg Dry Gauze Edema Control 3 Layer Compression System - Right Lower Extremity Avoid standing for long periods of time Elevate legs to the level of the heart or above for 30 minutes daily and/or when sitting, a frequency of: - throughout the day Exercise regularly Electronic Signature(s) Signed: 07/15/2019 4:51:55 PM By: Linton Ham MD Signed: 07/15/2019 5:54:52 PM By: Levan Hurst RN, BSN Entered By: Levan Hurst on 07/15/2019 12:22:34 -------------------------------------------------------------------------------- Problem List Details Patient Name: Date of Service: [name], [name] 07/15/2019 10:45 A M Medical Record Number: [id number] Patient Account Number: [id number] Date of Birth/Sex: Treating RN: 23-Apr-1934 (85 y.o. [name] Primary Care Provider: [name] Other Clinician: Referring Provider: Treating Provider/Extender: Louis Meckel in Treatment: 12 Active Problems ICD-10 Encounter Code Description Active Date MDM Diagnosis I87.311 Chronic venous hypertension (idiopathic) with ulcer of right lower extremity 04/19/2019 No Yes L97.812 Non-pressure chronic ulcer of other part of right lower leg with fat layer 04/19/2019 No Yes exposed S81.811D Laceration without foreign body, right lower leg, subsequent encounter 04/19/2019 No Yes Z92.241 Personal history of systemic steroid therapy 04/19/2019 No Yes Inactive Problems Resolved Problems Electronic Signature(s) Signed: 07/15/2019 4:51:55 PM By: Linton Ham MD Entered By: Linton Ham on 07/15/2019 13:05:27 -------------------------------------------------------------------------------- Progress Note Details Patient Name: Date of Service: [name], [name] 07/15/2019 10:45 A M Medical Record  Number: [id number] Patient Account Number: [id number] Date of Birth/Sex: Treating RN: March 07, 1934 (85 y.o. [name] Primary Care Provider: [name] Other Clinician: Referring Provider: Treating Provider/Extender: Herma Ard, Moses Manners in Treatment: 12 Subjective History of Present Illness (HPI) ADMISSION 04/19/2019 This is an 85 year old woman who has chronic interstitial lung disease on prednisone 10 mg a day. T owards the end of December she hit her lateral right leg on a chair. She was seen in urgent care and the area was Steri-Stripped however this did not hold together. Sometime after this traumatized the medial part of her right leg. She has some chronic edema in her lower legs probably is a secondary phenomenon to chronic venous insufficiency. She also takes chronic steroids. He saw her primary physician who is Dr. [name] at [place] and was given doxycycline on 2/8 I believe she has completed that. She is here for review of nonhealing areas on the right lateral greater than right medial calf Past medical history; includes interstitial lung disease on prednisone 10 mg and is just started Ofev. She has a history of basal cell skin cancer, hyperlipidemia. ABI in our clinic was 1.04 in the right 2/26; patient with chronic venous insufficiency and interstitial lung disease on chronic prednisone. She has a wound on her right lateral and right medial lower leg on the right which were separate incidents of trauma. These wounds started in December. She had very significant debris on both of the wounds last week which required debridement they require debridement again today. We have been using Iodoflex 3/4; the patient has two wounds on her right lower leg one laterally and one medially in the setting of chronic venous hypertension and resultant separate incidences of trauma. The wounds actually look better in terms of surface. We have been using Iodoflex, changed to  Hydrofera Blue classic today 3/11; she complained of pain almost like lancinating pain in the wound on the right lateral leg. This was new this week and she attributed this to the fact that we changed her to [facility]. Previously everything was well tolerated including at least 2 weeks of Sorbact. She has a $254 per application co-pay for Apligraf. She expressed the fact that it they would be able to pay this if it was felt to be necessary 3/19; patient is complaining about the facility charge coming here. We listened to this for a while. We put polymen on this last week after she experience pain with Hydrofera Blue she seems to tolerate this better. She has an area on the right lateral and right medial leg in the setting of chronic venous insufficiency and chronic prednisone use. Although her overall surface area is not any better the wound surface certainly looks a lot better 3/26; patient has 2 traumatic areas in the setting of chronic venous insufficiency. One is on the right lateral and a separate incidence on the right medial lower extremity. These are not that large and the surface of them looks adequate however very slow to come down in size. 05/31/2019 upon evaluation today patient appears to be doing decently well in regard to her wounds today. Both are measuring slightly smaller which is good news. Fortunately there is no signs of active infection at this time. Subsequently I do believe that she is a candidate for proceeding with the Apligraf at this point we did get approval and she does want to proceed as such. Fortunately there is no signs of active infection at this time. 4/16; patient arrives in clinic today for application of Apligraf #2. The patient was initially very upset and angry about the slow progress of her wound healing and the effect this is having on her life, her lung function [idiopathic pulmonary fibrosiso] And has caused a deterioration in her pulmonary function test.  Although I certainly was willing to listen to the effect is having on her life and her quality of life I told her that I did not think this was causing a deterioration in her pulmonary function tests. She follows with the pulmonary clinic at [facility] and at 1 point in this discussion I did offer to refer her for consultation at the [facility] wound care clinic which she declined. 5/3; the patient's major wound on the right lateral lower leg has contracted in a major way. She still has a smaller area medially on the right. We applied Apligraf #3 today. 5/17; the patient has a very tiny area left on the right anterior lower leg. Everything else is closed over. The area remaining on the right anterior lower leg is too small to consider another Apligraf application. We used moistened collagen under compression today She has traumatized her left anterior mid tibia area. She did this on a stair. There is nothing open here however she has a fairly significant contusion. I am hopeful that this will not open. Patient with chronic venous insufficiency and chronic prednisone induced skin damage [interstitial lung disease] Objective Constitutional Patient is hypertensive.. Pulse regular and within target range for patient.[name] Respirations regular, non-labored and within target range.. Temperature is normal and within the target range for the patient.[name] Appears in no distress. Vitals Time Taken: 11:15 AM, Height: 64 in, Weight: 125 lbs, BMI: 21.5, Temperature: 98 F, Pulse: 89 bpm, Respiratory Rate: 19 breaths/min, Blood Pressure: 168/74 mmHg. Cardiovascular Pedal pulses palpable bilaterally. General  Notes: Wound exam; the patient only has 1 small remaining open area from the substantial laceration injury on the right lateral lower leg. The area medially has closed over. There is nothing open on the left lateral. Integumentary (Hair, Skin) The patient has a contusion on the left anterior lower leg fairly substantial  bruising which apparently just happened yesterday. The center part of this looks a bit threatened to me.. Wound #1 status is Open. Original cause of wound was Trauma. The wound is located on the Right,Lateral Lower Leg. The wound measures 0.3cm length x 0.4cm width x 0.1cm depth; 0.094cm^2 area and 0.009cm^3 volume. There is no tunneling or undermining noted. There is a medium amount of serosanguineous drainage noted. The wound margin is flat and intact. There is large (67-100%) pink granulation within the wound bed. There is no necrotic tissue within the wound bed. Wound #2 status is Open. Original cause of wound was Trauma. The wound is located on the Right,Medial Lower Leg. The wound measures 0cm length x 0cm width x 0cm depth; 0cm^2 area and 0cm^3 volume. There is no tunneling or undermining noted. There is a none present amount of drainage noted. The wound margin is flat and intact. There is no granulation within the wound bed. There is no necrotic tissue within the wound bed. Wound #3 status is Open. Original cause of wound was Trauma. The wound is located on the Left,Medial Lower Leg. The wound measures 0cm length x 0cm width x 0cm depth; 0cm^2 area and 0cm^3 volume. There is no tunneling or undermining noted. There is a none present amount of drainage noted. The wound margin is distinct with the outline attached to the wound base. There is no granulation within the wound bed. There is no necrotic tissue within the wound bed. Assessment Active Problems ICD-10 Chronic venous hypertension (idiopathic) with ulcer of right lower extremity Non-pressure chronic ulcer of other part of right lower leg with fat layer exposed Laceration without foreign body, right lower leg, subsequent encounter Personal history of systemic steroid therapy Procedures Wound #1 Pre-procedure diagnosis of Wound #1 is a Venous Leg Ulcer located on the Right,Lateral Lower Leg . There was a Three Layer Compression  Therapy Procedure by Levan Hurst, RN. Post procedure Diagnosis Wound #1: Same as Pre-Procedure Plan Follow-up Appointments: Return Appointment in 1 week. Dressing Change Frequency: Wound #1 Right,Lateral Lower Leg: Do not change entire dressing for one week. Skin Barriers/Peri-Wound Care: Moisturizing lotion Wound Cleansing: May shower with protection. - use a cast protector. can get at [facility] or [facility] Primary Wound Dressing: Wound #1 Right,Lateral Lower Leg: Collagen - moisten with hydrogel Secondary Dressing: Wound #1 Right,Lateral Lower Leg: Dry Gauze Edema Control: 3 Layer Compression System - Right Lower Extremity Avoid standing for long periods of time Elevate legs to the level of the heart or above for 30 minutes daily and/or when sitting, a frequency of: - throughout the day Exercise regularly 1. Silver collagen to the right lateral lower leg. I did not reapply an Apligraf 2. 3 layer compression system right lower extremity 3. I have advised her to get at least some support stockings to protect the skin on her left leg. Electronic Signature(s) Signed: 07/15/2019 4:51:55 PM By: Linton Ham MD Entered By: Linton Ham on 07/15/2019 13:10:43 -------------------------------------------------------------------------------- SuperBill Details Patient Name: Date of Service: [name], [name] 07/15/2019 Medical Record Number: [id number] Patient Account Number: [id number] Date of Birth/Sex: Treating RN: [birth year] (85 y.o. [name] Primary Care Provider: Laurann Montana,  John Other Clinician: Referring Provider: Treating Provider/Extender: Herma Ard, Moses Manners in Treatment: 12 Diagnosis Coding ICD-10 Codes Code Description I87.311 Chronic venous hypertension (idiopathic) with ulcer of right lower extremity L97.812 Non-pressure chronic ulcer of other part of right lower leg with fat layer exposed S81.811D Laceration without foreign body, right lower  leg, subsequent encounter Z92.241 Personal history of systemic steroid therapy Facility Procedures CPT4 Code: [id number] Description: (Facility Use Only) [phone] - [place] LWR RT LEG Modifier: Quantity: 1 Physician Procedures : CPT4 Code Description Modifier [id number] - WC PHYS LEVEL 3 - EST PT ICD-10 Diagnosis Description I87.311 Chronic venous hypertension (idiopathic) with ulcer of right lower extremity L97.812 Non-pressure chronic ulcer of other part of right lower  leg with fat layer exposed Quantity: 1 Electronic Signature(s) Signed: 07/15/2019 4:51:55 PM By: Linton Ham MD Entered By: Linton Ham on 07/15/2019 13:11:04

## 2019-07-15 NOTE — Telephone Encounter (Signed)
Disc has been returned back from [place].

## 2019-07-15 NOTE — Progress Notes (Signed)
[name] ([id number]) Visit Report for 07/15/2019 Arrival Information Details Patient Name: Date of Service: [name], [name]. 07/15/2019 10:45 A M Medical Record Number: [id number] Patient Account Number: [id number] Date of Birth/Sex: Treating RN: 10/20/1934 (85 y.o. [name], [name] Primary Care [name]: [name] Other Clinician: Referring [name]: Treating Dam Ashraf/Extender: [name] in Treatment: 12 Visit Information History Since Last Visit Added or deleted any medications: No Patient Arrived: Ambulatory Any new allergies or adverse reactions: No Arrival Time: 11:33 Had a fall or experienced change in No Accompanied By: self activities of daily living that may affect Transfer Assistance: None risk of falls: Patient Identification Verified: Yes Signs or symptoms of abuse/neglect since last visito No Secondary Verification Process Completed: Yes Hospitalized since last visit: No Patient Requires Transmission-Based Precautions: No Implantable device outside of the clinic excluding No Patient Has Alerts: No cellular tissue based products placed in the center since last visit: Has Dressing in Place as Prescribed: Yes Has Compression in Place as Prescribed: Yes Pain Present Now: No Electronic Signature(s) Signed: 07/15/2019 5:05:18 PM By: [name] Entered By: [name] on 07/15/2019 11:33:34 -------------------------------------------------------------------------------- Compression Therapy Details Patient Name: Date of Service: [name], [name]. 07/15/2019 10:45 A M Medical Record Number: [id number] Patient Account Number: [id number] Date of Birth/Sex: Treating RN: Jan 27, 1935 (85 y.o. [name] Primary Care [name]: [name] Other Clinician: Referring [name]: Treating [name]/Extender: [name] in Treatment: 12 Compression Therapy Performed for Wound Assessment: Wound  #1 Right,Lateral Lower Leg Performed By: Clinician Terry Hurst, RN Compression Type: Three Layer Post Procedure Diagnosis Same as Pre-procedure Electronic Signature(s) Signed: 07/15/2019 5:54:52 PM By: Terry Hurst RN, BSN Entered By: [name] on 07/15/2019 12:23:01 -------------------------------------------------------------------------------- Encounter Discharge Information Details Patient Name: Date of Service: [name], [name]. 07/15/2019 10:45 A M Medical Record Number: [id number] Patient Account Number: [id number] Date of Birth/Sex: Treating RN: 1934-12-07 (85 y.o. [name] Primary Care [name]: [name] Other Clinician: Referring Karelyn Brisby: Treating [name]/Extender: [name] in Treatment: 12 Encounter Discharge Information Items Discharge Condition: Stable Ambulatory Status: Ambulatory Discharge Destination: Home Transportation: Private Auto Accompanied By: self Schedule Follow-up Appointment: Yes Clinical Summary of Care: Patient Declined Electronic Signature(s) Signed: 07/15/2019 5:05:18 PM By: [name] Entered By: [name] on 07/15/2019 12:30:48 -------------------------------------------------------------------------------- Lower Extremity Assessment Details Patient Name: Date of Service: [name], [name]. 07/15/2019 10:45 A M Medical Record Number: [id number] Patient Account Number: [id number] Date of Birth/Sex: Treating RN: 1934/06/05 (85 y.o. [name] Primary Care [name]: [name] Other Clinician: Referring Kampbell Holaway: Treating [name]/Extender: [name] in Treatment: 12 Edema Assessment Assessed: [name]: No] [name]: No] Edema: [Left: No] [Right: Yes] Calf Left: Right: Point of Measurement: cm From Medial Instep 33 cm 31.5 cm Ankle Left: Right: Point of Measurement: cm From Medial Instep 23.5 cm 22 cm Vascular  Assessment Pulses: Dorsalis Pedis Palpable: [Right:Yes] Electronic Signature(s) Signed: 07/15/2019 5:05:18 PM By: [name] Entered By: [name] on 07/15/2019 11:35:43 -------------------------------------------------------------------------------- Multi Wound Chart Details Patient Name: Date of Service: [name], [name]. 07/15/2019 10:45 A M Medical Record Number: [id number] Patient Account Number: [id number] Date of Birth/Sex: Treating RN: 1934-11-28 (86 y.o. [name] Primary Care [name]: [name] Other Clinician: Referring [name]: Treating [name]/Extender: [name] in Treatment: 12 Vital Signs Height(in): 64 Pulse(bpm): 89 Weight(lbs): 125 Blood Pressure(mmHg): 168/74 Body Mass Index(BMI): 21 Temperature(F):  98 Respiratory Rate(breaths/min): 19 Photos: [1:No Photos Right, Lateral Lower Leg] [2:No Photos Right, Medial Lower Leg] [3:No Photos Left, Medial Lower Leg] Wound Location: [1:Trauma] [2:Trauma] [3:Trauma] Wounding Event: [1:Venous Leg Ulcer] [2:Venous Leg Ulcer] [3:Skin Tear] Primary Etiology: [1:Cataracts, Confinement Anxiety] [2:Cataracts, Confinement Anxiety] [3:Cataracts, Confinement Anxiety] Comorbid History: [1:02/27/2019] [2:02/17/2019] [3:07/01/2019] Date Acquired: [1:12] [2:12] [3:2] Weeks of Treatment: [1:Open] [2:Open] [3:Open] Wound Status: [1:0.3x0.4x0.1] [2:0x0x0] [3:0x0x0] Measurements L x W x D (cm) [1:0.094] [2:0] [3:0] A (cm) : rea [1:0.009] [2:0] [3:0] Volume (cm) : [1:96.80%] [2:100.00%] [3:100.00%] % Reduction in A rea: [1:97.00%] [2:100.00%] [3:100.00%] % Reduction in Volume: [1:Full Thickness Without Exposed] [2:Full Thickness Without Exposed] [3:Partial Thickness] Classification: [1:Support Structures Medium] [2:Support Structures None Present] [3:None Present] Exudate Amount: [1:Serosanguineous] [2:N/A] [3:N/A] Exudate Type: [1:red, brown] [2:N/A] [3:N/A] Exudate Color:  [1:Flat and Intact] [2:Flat and Intact] [3:Distinct, outline attached] Wound Margin: [1:Large (67-100%)] [2:None Present (0%)] [3:None Present (0%)] Granulation Amount: [1:Pink] [2:N/A] [3:N/A] Granulation Quality: [1:None Present (0%)] [2:None Present (0%)] [3:None Present (0%)] Necrotic Amount: [1:Fascia: No] [2:Fascia: No] [3:Fascia: No] Exposed Structures: [1:Fat Layer (Subcutaneous Tissue) Exposed: No Tendon: No Muscle: No Joint: No Bone: No Small (1-33%)] [2:Fat Layer (Subcutaneous Tissue) Exposed: No Tendon: No Muscle: No Joint: No Bone: No Small (1-33%)] [3:Fat Layer (Subcutaneous Tissue) Exposed: No  Tendon: No Muscle: No Joint: No Bone: No Large (67-100%)] Epithelialization: [1:Compression Therapy] [2:N/A] [3:N/A] Treatment Notes Wound #1 (Right, Lateral Lower Leg) 1. Cleanse With Wound Cleanser Soap and water 2. Periwound Care Moisturizing lotion 3. Primary Dressing Applied Collagen 4. Secondary Dressing Dry Gauze 6. Support Layer Applied 3 layer compression wrap Notes netting Electronic Signature(s) Signed: 07/15/2019 4:51:55 PM By: Linton Ham MD Signed: 07/15/2019 5:54:52 PM By: Terry Hurst RN, BSN Entered By: Linton Ham on 07/15/2019 13:05:34 -------------------------------------------------------------------------------- Multi-Disciplinary Care Plan Details Patient Name: Date of Service: [facility], [name]. 07/15/2019 10:45 A M Medical Record Number: [id number] Patient Account Number: [id number] Date of Birth/Sex: Treating RN: 13-Aug-1934 (85 y.o. [name] Primary Care Shakyra Mattera: [name] Other Clinician: Referring Clarissia Mckeen: Treating Dondre Catalfamo/Extender: [name] in Treatment: 12 Active Inactive Wound/Skin Impairment Nursing Diagnoses: Impaired tissue integrity Goals: Patient/caregiver will verbalize understanding of skin care regimen Date Initiated: 07/01/2019 Target Resolution Date: 08/02/2019 Goal Status:  Active Ulcer/skin breakdown will have a volume reduction of 30% by week 4 Date Initiated: 04/19/2019 Date Inactivated: 07/01/2019 Target Resolution Date: 06/28/2019 Goal Status: Met Interventions: Provide education on ulcer and skin care Notes: Electronic Signature(s) Signed: 07/15/2019 5:54:52 PM By: Terry Hurst RN, BSN Entered By: [name] on 07/15/2019 14:13:49 -------------------------------------------------------------------------------- Pain Assessment Details Patient Name: Date of Service: [name], [name]. 07/15/2019 10:45 A M Medical Record Number: [id number] Patient Account Number: [id number] Date of Birth/Sex: Treating RN: 27-Feb-1935 (85 y.o. [name] Primary Care Weber Monnier: [name] Other Clinician: Referring Ivanna Kocak: Treating Chantry Headen/Extender: [name] in Treatment: 12 Active Problems Location of Pain Severity and Description of Pain Patient Has Paino No Site Locations Pain Management and Medication Current Pain Management: Electronic Signature(s) Signed: 07/15/2019 5:05:18 PM By: [name] Entered By: [name] on 07/15/2019 11:34:42 -------------------------------------------------------------------------------- Patient/Caregiver Education Details Patient Name: Date of Service: [name], [name]. 5/17/2021andnbsp10:45 A M Medical Record Number: [id number] Patient Account Number: [id number] Date of Birth/Gender: Treating RN: 01-14-1935 (85 y.o. [name] Primary Care Physician: [name] Other Clinician: Referring Physician: Treating Physician/Extender: [name] in Treatment: 12 Education Assessment Education Provided  To: Patient Education Topics Provided Wound/Skin Impairment: Methods: Explain/Verbal Responses: State content correctly Electronic Signature(s) Signed: 07/15/2019 5:54:52 PM By: Terry Hurst RN, BSN Entered By: [name] on  07/15/2019 14:14:01 -------------------------------------------------------------------------------- Wound Assessment Details Patient Name: Date of Service: [name], [name]. 07/15/2019 10:45 A M Medical Record Number: [id number] Patient Account Number: [id number] Date of Birth/Sex: Treating RN: 1934-08-03 (85 y.o. [name] Primary Care Dawnisha Marquina: [name] Other Clinician: Referring Tu Bayle: Treating Neal Trulson/Extender: [name] in Treatment: 12 Wound Status Wound Number: 1 Primary Etiology: Venous Leg Ulcer Wound Location: Right, Lateral Lower Leg Wound Status: Open Wounding Event: Trauma Comorbid History: Cataracts, Confinement Anxiety Date Acquired: 02/27/2019 Weeks Of Treatment: 12 Clustered Wound: No Wound Measurements Length: (cm) 0.3 Width: (cm) 0.4 Depth: (cm) 0.1 Area: (cm) 0.094 Volume: (cm) 0.009 % Reduction in Area: 96.8% % Reduction in Volume: 97% Epithelialization: Small (1-33%) Tunneling: No Undermining: No Wound Description Classification: Full Thickness Without Exposed Support Structures Wound Margin: Flat and Intact Exudate Amount: Medium Exudate Type: Serosanguineous Exudate Color: red, brown Foul Odor After Cleansing: No Slough/Fibrino No Wound Bed Granulation Amount: Large (67-100%) Exposed Structure Granulation Quality: Pink Fascia Exposed: No Necrotic Amount: None Present (0%) Fat Layer (Subcutaneous Tissue) Exposed: No Tendon Exposed: No Muscle Exposed: No Joint Exposed: No Bone Exposed: No Treatment Notes Wound #1 (Right, Lateral Lower Leg) 1. Cleanse With Wound Cleanser Soap and water 2. Periwound Care Moisturizing lotion 3. Primary Dressing Applied Collagen 4. Secondary Dressing Dry Gauze 6. Support Layer Applied 3 layer compression wrap Notes netting Electronic Signature(s) Signed: 07/15/2019 5:05:18 PM By: [name] Entered By: [name] on 07/15/2019  11:37:18 -------------------------------------------------------------------------------- Wound Assessment Details Patient Name: Date of Service: [name], [name]. 07/15/2019 10:45 A M Medical Record Number: [id number] Patient Account Number: [id number] Date of Birth/Sex: Treating RN: Apr 20, 1934 (85 y.o. [name] Primary Care Zahria Ding: [name] Other Clinician: Referring Rumaldo Difatta: Treating Kingslee Dowse/Extender: [name] in Treatment: 12 Wound Status Wound Number: 2 Primary Etiology: Venous Leg Ulcer Wound Location: Right, Medial Lower Leg Wound Status: Open Wounding Event: Trauma Comorbid History: Cataracts, Confinement Anxiety Date Acquired: 02/17/2019 Weeks Of Treatment: 12 Clustered Wound: No Wound Measurements Length: (cm) Width: (cm) Depth: (cm) Area: (cm) Volume: (cm) 0 % Reduction in Area: 100% 0 % Reduction in Volume: 100% 0 Epithelialization: Small (1-33%) 0 Tunneling: No 0 Undermining: No Wound Description Classification: Full Thickness Without Exposed Support Structures Wound Margin: Flat and Intact Exudate Amount: None Present Foul Odor After Cleansing: No Slough/Fibrino No Wound Bed Granulation Amount: None Present (0%) Exposed Structure Necrotic Amount: None Present (0%) Fascia Exposed: No Fat Layer (Subcutaneous Tissue) Exposed: No Tendon Exposed: No Muscle Exposed: No Joint Exposed: No Bone Exposed: No Electronic Signature(s) Signed: 07/15/2019 5:05:18 PM By: [name] Entered By: [name] on 07/15/2019 11:37:34 -------------------------------------------------------------------------------- Wound Assessment Details Patient Name: Date of Service: [name], [name]. 07/15/2019 10:45 A M Medical Record Number: [id number] Patient Account Number: [id number] Date of Birth/Sex: Treating RN: 09/29/1934 (85 y.o. [name] Primary Care Haiden Rawlinson: [name] Other  Clinician: Referring Zalea Pete: Treating Brandi Tomlinson/Extender: [name] in Treatment: 12 Wound Status Wound Number: 3 Primary Etiology: Skin Tear Wound Location: Left, Medial Lower Leg Wound Status: Open Wounding Event: Trauma Comorbid History: Cataracts, Confinement Anxiety Date Acquired: 07/01/2019 Weeks Of Treatment: 2 Clustered Wound: No Wound Measurements Length: (cm) Width: (cm) Depth: (cm) Area: (cm) Volume: (cm) 0 % Reduction in  Area: 100% 0 % Reduction in Volume: 100% 0 Epithelialization: Large (67-100%) 0 Tunneling: No 0 Undermining: No Wound Description Classification: Partial Thickness Wound Margin: Distinct, outline attached Exudate Amount: None Present Foul Odor After Cleansing: No Slough/Fibrino No Wound Bed Granulation Amount: None Present (0%) Exposed Structure Necrotic Amount: None Present (0%) Fascia Exposed: No Fat Layer (Subcutaneous Tissue) Exposed: No Tendon Exposed: No Muscle Exposed: No Joint Exposed: No Bone Exposed: No Electronic Signature(s) Signed: 07/15/2019 5:05:18 PM By: [name] Entered By: [name] on 07/15/2019 11:37:51 -------------------------------------------------------------------------------- Vitals Details Patient Name: Date of Service: [name], [name]. 07/15/2019 10:45 A M Medical Record Number: [id number] Patient Account Number: [id number] Date of Birth/Sex: Treating RN: 1935-01-24 (85 y.o. [name] Primary Care Nicolae Vasek: [name] Other Clinician: Referring Dejia Ebron: Treating Ezmeralda Stefanick/Extender: [name] in Treatment: 12 Vital Signs Time Taken: 11:15 Temperature (F): 98 Height (in): 64 Pulse (bpm): 89 Weight (lbs): 125 Respiratory Rate (breaths/min): 19 Body Mass Index (BMI): 21.5 Blood Pressure (mmHg): 168/74 Reference Range: 80 - 120 mg / dl Electronic Signature(s) Signed: 07/15/2019 5:05:18 PM By: [name] Entered By: [name] on 07/15/2019 11:34:21

## 2019-07-16 NOTE — Telephone Encounter (Signed)
Will resend the disc to [facility].

## 2019-07-16 NOTE — Telephone Encounter (Signed)
MR, check to see if you are able to see the images. If not, I will resend the disc to [facility].

## 2019-07-16 NOTE — Telephone Encounter (Signed)
The CT is not in PACS

## 2019-07-20 DIAGNOSIS — J849 Interstitial pulmonary disease, unspecified: Secondary | ICD-10-CM | POA: Diagnosis not present

## 2019-07-22 ENCOUNTER — Encounter (HOSPITAL_BASED_OUTPATIENT_CLINIC_OR_DEPARTMENT_OTHER): Payer: Medicare HMO | Admitting: Internal Medicine

## 2019-07-22 ENCOUNTER — Other Ambulatory Visit: Payer: Self-pay

## 2019-07-22 DIAGNOSIS — I10 Essential (primary) hypertension: Secondary | ICD-10-CM | POA: Diagnosis not present

## 2019-07-22 DIAGNOSIS — S81811A Laceration without foreign body, right lower leg, initial encounter: Secondary | ICD-10-CM | POA: Diagnosis not present

## 2019-07-22 DIAGNOSIS — X58XXXA Exposure to other specified factors, initial encounter: Secondary | ICD-10-CM | POA: Diagnosis not present

## 2019-07-22 DIAGNOSIS — J849 Interstitial pulmonary disease, unspecified: Secondary | ICD-10-CM | POA: Diagnosis not present

## 2019-07-22 DIAGNOSIS — E785 Hyperlipidemia, unspecified: Secondary | ICD-10-CM | POA: Diagnosis not present

## 2019-07-22 DIAGNOSIS — L97812 Non-pressure chronic ulcer of other part of right lower leg with fat layer exposed: Secondary | ICD-10-CM | POA: Diagnosis not present

## 2019-07-22 DIAGNOSIS — Z85828 Personal history of other malignant neoplasm of skin: Secondary | ICD-10-CM | POA: Diagnosis not present

## 2019-07-22 DIAGNOSIS — Z7952 Long term (current) use of systemic steroids: Secondary | ICD-10-CM | POA: Diagnosis not present

## 2019-07-22 DIAGNOSIS — I87311 Chronic venous hypertension (idiopathic) with ulcer of right lower extremity: Secondary | ICD-10-CM | POA: Diagnosis not present

## 2019-07-25 NOTE — Progress Notes (Signed)
[name] ([id number]) Visit Report for 07/22/2019 HPI Details Patient Name: Date of Service: [name], [place] PE P. 07/22/2019 10:45 A M Medical Record Number: [id number] Patient Account Number: [id number] Date of Birth/Sex: Treating RN: [birth year] (85 y.o. [name] Primary Care Provider: [name] Other Clinician: Referring Provider: Treating Provider/Extender: Lesly Dukes in Treatment: 13 History of Present Illness HPI Description: ADMISSION 04/19/2019 This is an 85 year old woman who has chronic interstitial lung disease on prednisone 10 mg a day. T owards the end of December she hit her lateral right leg on a chair. She was seen in urgent care and the area was Steri-Stripped however this did not hold together. Sometime after this traumatized the medial part of her right leg. She has some chronic edema in her lower legs probably is a secondary phenomenon to chronic venous insufficiency. She also takes chronic steroids. He saw her primary physician who is Dr. [name] at [place] and was given doxycycline on 2/8 I believe she has completed that. She is here for review of nonhealing areas on the right lateral greater than right medial calf Past medical history; includes interstitial lung disease on prednisone 10 mg and is just started Ofev. She has a history of basal cell skin cancer, hyperlipidemia. ABI in our clinic was 1.04 in the right 2/26; patient with chronic venous insufficiency and interstitial lung disease on chronic prednisone. She has a wound on her right lateral and right medial lower leg on the right which were separate incidents of trauma. These wounds started in December. She had very significant debris on both of the wounds last week which required debridement they require debridement again today. We have been using Iodoflex 3/4; the patient has two wounds on her right lower leg one laterally and one medially in the setting of chronic  venous hypertension and resultant separate incidences of trauma. The wounds actually look better in terms of surface. We have been using Iodoflex, changed to [facility] classic today 3/11; she complained of pain almost like lancinating pain in the wound on the right lateral leg. This was new this week and she attributed this to the fact that we changed her to [facility]. Previously everything was well tolerated including at least 2 weeks of Sorbact. She has a $017 per application co-pay for Apligraf. She expressed the fact that it they would be able to pay this if it was felt to be necessary 3/19; patient is complaining about the facility charge coming here. We listened to this for a while. We put polymen on this last week after she experience pain with Hydrofera Blue she seems to tolerate this better. She has an area on the right lateral and right medial leg in the setting of chronic venous insufficiency and chronic prednisone use. Although her overall surface area is not any better the wound surface certainly looks a lot better 3/26; patient has 2 traumatic areas in the setting of chronic venous insufficiency. One is on the right lateral and a separate incidence on the right medial lower extremity. These are not that large and the surface of them looks adequate however very slow to come down in size. 05/31/2019 upon evaluation today patient appears to be doing decently well in regard to her wounds today. Both are measuring slightly smaller which is good news. Fortunately there is no signs of active infection at this time. Subsequently I do believe that she is a candidate for proceeding with the Apligraf at  this point we did get approval and she does want to proceed as such. Fortunately there is no signs of active infection at this time. 4/16; patient arrives in clinic today for application of Apligraf #2. The patient was initially very upset and angry about the slow progress of her wound  healing and the effect this is having on her life, her lung function [idiopathic pulmonary fibrosiso] And has caused a deterioration in her pulmonary function test. Although I certainly was willing to listen to the effect is having on her life and her quality of life I told her that I did not think this was causing a deterioration in her pulmonary function tests. She follows with the pulmonary clinic at [facility] and at 1 point in this discussion I did offer to refer her for consultation at the [facility] wound care clinic which she declined. 5/3; the patient's major wound on the right lateral lower leg has contracted in a major way. She still has a smaller area medially on the right. We applied Apligraf #3 today. 5/17; the patient has a very tiny area left on the right anterior lower leg. Everything else is closed over. The area remaining on the right anterior lower leg is too small to consider another Apligraf application. We used moistened collagen under compression today She has traumatized her left anterior mid tibia area. She did this on a stair. There is nothing open here however she has a fairly significant contusion. I am hopeful that this will not open. Patient with chronic venous insufficiency and chronic prednisone induced skin damage [interstitial lung disease] 07/22/19-Patient's right shin wound has healed, she has a small bruise on the left anterior mid tibial area for which she is using a pad and this so far seems to be doing well and does not have an open area Electronic Signature(s) Signed: 07/22/2019 11:28:52 AM By: Tobi Bastos MD, MBA Entered By: Tobi Bastos on 07/22/2019 11:28:52 -------------------------------------------------------------------------------- Physical Exam Details Patient Name: Date of Service: [name], [name] 07/22/2019 10:45 A M Medical Record Number: [id number] Patient Account Number: [id number] Date of Birth/Sex: Treating RN: 12/14/1934 (85 y.o. [name] Primary Care Provider: [name] Other Clinician: Referring Provider: Treating Provider/Extender: Lesly Dukes in Treatment: 13 Constitutional alert and oriented x 3. sitting or standing blood pressure is within target range for patient.. supine blood pressure is within target range for patient.. pulse regular and within target range for patient.[name] respirations regular, non-labored and within target range for patient.[name] temperature within target range for patient.. . . Well- nourished and well-hydrated in no acute distress. Notes Right lower extremity wound has healed, left anterior shin bruise appears to be stable with a very small open area below it Electronic Signature(s) Signed: 07/22/2019 11:29:23 AM By: Tobi Bastos MD, MBA Entered By: Tobi Bastos on 07/22/2019 11:29:23 -------------------------------------------------------------------------------- Physician Orders Details Patient Name: Date of Service: [name], [name] 07/22/2019 10:45 A M Medical Record Number: [id number] Patient Account Number: [id number] Date of Birth/Sex: Treating RN: Oct 22, 1934 (85 y.o. [name] Primary Care Provider: [name] Other Clinician: Referring Provider: Treating Provider/Extender: Lesly Dukes in Treatment: 13 Verbal / Phone Orders: No Diagnosis Coding Discharge From [facility] Services Discharge from [place] Moisturizing lotion - both legs daily Edema Control Elevate legs to the level of the heart or above for 30 minutes daily and/or when sitting, a frequency of: - throughout the day Support Garment  20-30 mm/Hg pressure to: - compression stockigs both legs daily, apply first thing in the morning, remove at night before bed Electronic Signature(s) Signed: 07/22/2019 5:12:22 PM By: Levan Hurst RN, BSN Signed: 07/25/2019 4:57:23 PM By: Tobi Bastos MD, MBA Entered By: Levan Hurst on 07/22/2019 11:26:45 -------------------------------------------------------------------------------- Progress Note Details Patient Name: Date of Service: [name], [name] 07/22/2019 10:45 A M Medical Record Number: [id number] Patient Account Number: [id number] Date of Birth/Sex: Treating RN: 02-07-1935 (85 y.o. [name] Primary Care Provider: [name] Other Clinician: Referring Provider: Treating Provider/Extender: Lesly Dukes in Treatment: 13 Subjective History of Present Illness (HPI) ADMISSION 04/19/2019 This is an 85 year old woman who has chronic interstitial lung disease on prednisone 10 mg a day. T owards the end of December she hit her lateral right leg on a chair. She was seen in urgent care and the area was Steri-Stripped however this did not hold together. Sometime after this traumatized the medial part of her right leg. She has some chronic edema in her lower legs probably is a secondary phenomenon to chronic venous insufficiency. She also takes chronic steroids. He saw her primary physician who is Dr. [name] at [place] and was given doxycycline on 2/8 I believe she has completed that. She is here for review of nonhealing areas on the right lateral greater than right medial calf Past medical history; includes interstitial lung disease on prednisone 10 mg and is just started Ofev. She has a history of basal cell skin cancer, hyperlipidemia. ABI in our clinic was 1.04 in the right 2/26; patient with chronic venous insufficiency and interstitial lung disease on chronic prednisone. She has a wound on her right lateral and right medial lower leg on the right which were separate incidents of trauma. These wounds started in December. She had very significant debris on both of the wounds last week which required debridement they require debridement again today. We have been using Iodoflex 3/4; the patient has two wounds on her right  lower leg one laterally and one medially in the setting of chronic venous hypertension and resultant separate incidences of trauma. The wounds actually look better in terms of surface. We have been using Iodoflex, changed to [facility] classic today 3/11; she complained of pain almost like lancinating pain in the wound on the right lateral leg. This was new this week and she attributed this to the fact that we changed her to [facility]. Previously everything was well tolerated including at least 2 weeks of Sorbact. She has a $563 per application co-pay for Apligraf. She expressed the fact that it they would be able to pay this if it was felt to be necessary 3/19; patient is complaining about the facility charge coming here. We listened to this for a while. We put polymen on this last week after she experience pain with Hydrofera Blue she seems to tolerate this better. She has an area on the right lateral and right medial leg in the setting of chronic venous insufficiency and chronic prednisone use. Although her overall surface area is not any better the wound surface certainly looks a lot better 3/26; patient has 2 traumatic areas in the setting of chronic venous insufficiency. One is on the right lateral and a separate incidence on the right medial lower extremity. These are not that large and the surface of them looks adequate however very slow to come down in size. 05/31/2019 upon evaluation today patient appears to be doing  decently well in regard to her wounds today. Both are measuring slightly smaller which is good news. Fortunately there is no signs of active infection at this time. Subsequently I do believe that she is a candidate for proceeding with the Apligraf at this point we did get approval and she does want to proceed as such. Fortunately there is no signs of active infection at this time. 4/16; patient arrives in clinic today for application of Apligraf #2. The patient was  initially very upset and angry about the slow progress of her wound healing and the effect this is having on her life, her lung function [idiopathic pulmonary fibrosiso] And has caused a deterioration in her pulmonary function test. Although I certainly was willing to listen to the effect is having on her life and her quality of life I told her that I did not think this was causing a deterioration in her pulmonary function tests. She follows with the pulmonary clinic at [facility] and at 1 point in this discussion I did offer to refer her for consultation at the [facility] wound care clinic which she declined. 5/3; the patient's major wound on the right lateral lower leg has contracted in a major way. She still has a smaller area medially on the right. We applied Apligraf #3 today. 5/17; the patient has a very tiny area left on the right anterior lower leg. Everything else is closed over. The area remaining on the right anterior lower leg is too small to consider another Apligraf application. We used moistened collagen under compression today She has traumatized her left anterior mid tibia area. She did this on a stair. There is nothing open here however she has a fairly significant contusion. I am hopeful that this will not open. Patient with chronic venous insufficiency and chronic prednisone induced skin damage [interstitial lung disease] 07/22/19-Patient's right shin wound has healed, she has a small bruise on the left anterior mid tibial area for which she is using a pad and this so far seems to be doing well and does not have an open area Objective Constitutional alert and oriented x 3. sitting or standing blood pressure is within target range for patient.. supine blood pressure is within target range for patient.. pulse regular and within target range for patient.[name] respirations regular, non-labored and within target range for patient.[name] temperature within target range for patient.. Well- nourished and  well-hydrated in no acute distress. Vitals Time Taken: 10:55 AM, Height: 64 in, Weight: 125 lbs, BMI: 21.5, Temperature: 97.8 F, Pulse: 90 bpm, Respiratory Rate: 19 breaths/min, Blood Pressure: 146/83 mmHg. General Notes: Right lower extremity wound has healed, left anterior shin bruise appears to be stable with a very small open area below it Integumentary (Hair, Skin) Wound #1 status is Healed - Epithelialized. Original cause of wound was Trauma. The wound is located on the Right,Lateral Lower Leg. The wound measures 0cm length x 0cm width x 0cm depth; 0cm^2 area and 0cm^3 volume. There is no tunneling or undermining noted. There is a none present amount of drainage noted. The wound margin is flat and intact. There is no granulation within the wound bed. There is no necrotic tissue within the wound bed. Plan Discharge From [facility] Services: Discharge from [place] Skin Barriers/Peri-Wound Care: Moisturizing lotion - both legs daily Edema Control: Elevate legs to the level of the heart or above for 30 minutes daily and/or when sitting, a frequency of: - throughout the day Support Garment 20-30 mm/Hg pressure to: -  compression stockigs both legs daily, apply first thing in the morning, remove at night before bed 1. Patient can be discharged in the clinic, she is dressing the small open area on the left daily, and this does not need any further attention 2. She can be seen if needed and has been notified to call us Electronic Signature(s) Signed: 07/22/2019 11:30:02 AM By: Tobi Bastos MD, MBA Entered By: Tobi Bastos on 07/22/2019 11:30:02 -------------------------------------------------------------------------------- SuperBill Details Patient Name: Date of Service: [name], [name] 07/22/2019 Medical Record Number: [id number] Patient Account Number: [id number] Date of Birth/Sex: Treating RN: [birth year] (85 y.o. [name] Primary Care Provider: [name] Other  Clinician: Referring Provider: Treating Provider/Extender: Lesly Dukes in Treatment: 13 Diagnosis Coding ICD-10 Codes Code Description I87.311 Chronic venous hypertension (idiopathic) with ulcer of right lower extremity L97.812 Non-pressure chronic ulcer of other part of right lower leg with fat layer exposed S81.811D Laceration without foreign body, right lower leg, subsequent encounter Z92.241 Personal history of systemic steroid therapy Facility Procedures CPT4 Code: 36067703 Description: 99213 - WOUND CARE VISIT-LEV 3 EST PT Modifier: Quantity: 1 Physician Procedures : CPT4 Code Description Modifier [id number] 99213 - WC PHYS LEVEL 3 - EST PT ICD-10 Diagnosis Description I87.311 Chronic venous hypertension (idiopathic) with ulcer of right lower extremity Quantity: 1 Electronic Signature(s) Signed: 07/22/2019 5:12:22 PM By: Levan Hurst RN, BSN Signed: 07/25/2019 4:57:23 PM By: Tobi Bastos MD, MBA Previous Signature: 07/22/2019 11:30:17 AM Version By: Tobi Bastos MD, MBA Entered By: Levan Hurst on 07/22/2019 11:44:12

## 2019-07-30 ENCOUNTER — Encounter (HOSPITAL_BASED_OUTPATIENT_CLINIC_OR_DEPARTMENT_OTHER): Payer: Medicare HMO | Admitting: Internal Medicine

## 2019-07-31 ENCOUNTER — Ambulatory Visit: Payer: Medicare HMO | Admitting: Internal Medicine

## 2019-07-31 NOTE — Telephone Encounter (Signed)
MR, please see pt's mychart message and advise on it for pt. Pt is talking about her IPF condition.

## 2019-07-31 NOTE — Telephone Encounter (Signed)
Disc was sent back to [employer] on 07/17/19 and has been returned back to [place]. Images should be in PACS soon.

## 2019-08-01 NOTE — Telephone Encounter (Signed)
Called patient and left message that Dr. [name] will call her for a televisit on 6/4 when he can. He is working at the hospital and will work her in sometime during the day.

## 2019-08-01 NOTE — Telephone Encounter (Signed)
[name] just look at the PACS system and the images not there.  Please track and let me know.  The CD was  with [name] today

## 2019-08-01 NOTE — Progress Notes (Signed)
EVADNA, DONAGHY ([id number]) Visit Report for 07/22/2019 Arrival Information Details Patient Name: Date of Service: [name], [name]. 07/22/2019 10:45 A M Medical Record Number: [id number] Patient Account Number: [id number] Date of Birth/Sex: Treating RN: 1935-02-28 (85 y.o. [name], [name] Primary Care [name]: [name] Other Clinician: Referring [name]: Treating [name]/Extender: [name] in Treatment: 52 Visit Information History Since Last Visit Added or deleted any medications: No Patient Arrived: Ambulatory Any new allergies or adverse reactions: No Arrival Time: 10:53 Had a fall or experienced change in No Accompanied By: self activities of daily living that may affect Transfer Assistance: None risk of falls: Patient Identification Verified: Yes Signs or symptoms of abuse/neglect since last visito No Secondary Verification Process Completed: Yes Hospitalized since last visit: No Patient Requires Transmission-Based Precautions: No Implantable device outside of the clinic excluding No Patient Has Alerts: No cellular tissue based products placed in the center since last visit: Has Dressing in Place as Prescribed: Yes Pain Present Now: No Electronic Signature(s) Signed: 08/01/2019 9:13:31 AM By: [name] Entered By: [name] on 07/22/2019 10:55:22 -------------------------------------------------------------------------------- Clinic Level of Care Assessment Details Patient Name: Date of Service: [name], [name]. 07/22/2019 10:45 A M Medical Record Number: [id number] Patient Account Number: [id number] Date of Birth/Sex: Treating RN: 1934-10-21 (85 y.o. [name] Primary Care [name]: [name] Other Clinician: Referring [name]: Treating [name]/Extender: [name] in Treatment: 13 Clinic Level of Care Assessment Items TOOL 4 Quantity Score X- 1 0 Use when only an EandM is  performed on FOLLOW-UP visit ASSESSMENTS - Nursing Assessment / Reassessment X- 1 10 Reassessment of Co-morbidities (includes updates in patient status) X- 1 5 Reassessment of Adherence to Treatment Plan ASSESSMENTS - Wound and Skin A ssessment / Reassessment X - Simple Wound Assessment / Reassessment - one wound 1 5 _0  - 0 Complex Wound Assessment / Reassessment - multiple wounds _1  - 0 Dermatologic / Skin Assessment (not related to wound area) ASSESSMENTS - Focused Assessment _2  - 0 Circumferential Edema Measurements - multi extremities _3  - 0 Nutritional Assessment / Counseling / Intervention X- 1 5 Lower Extremity Assessment (monofilament, tuning fork, pulses) _4  - 0 Peripheral Arterial Disease Assessment (using hand held doppler) ASSESSMENTS - Ostomy and/or Continence Assessment and Care _5  - 0 Incontinence Assessment and Management _6  - 0 Ostomy Care Assessment and Management (repouching, etc.) PROCESS - Coordination of Care X - Simple Patient / Family Education for ongoing care 1 15 _7  - 0 Complex (extensive) Patient / Family Education for ongoing care X- 1 10 Staff obtains Programmer, systems, Records, T Results / Process Orders est X- 1 10 Staff telephones HHA, Nursing Homes / Clarify orders / etc _8  - 0 Routine Transfer to another Facility (non-emergent condition) _9  - 0 Routine Hospital Admission (non-emergent condition) _10  - 0 New Admissions / Biomedical engineer / Ordering NPWT Apligraf, etc. , _11  - 0 Emergency Hospital Admission (emergent condition) _12  - 0 Simple Discharge Coordination _13  - 0 Complex (extensive) Discharge Coordination PROCESS - Special Needs _14  - 0 Pediatric / Minor Patient Management _15  - 0 Isolation Patient Management _16  - 0 Hearing / Language / Visual special needs _17  - 0 Assessment of Community assistance (transportation, D/C planning, etc.) _18  - 0 Additional assistance / Altered mentation _19  - 0 Support Surface(s) Assessment  (bed, cushion, seat, etc.) INTERVENTIONS - Wound Cleansing / Measurement X - Simple Wound Cleansing - one wound 1 5 _20  - 0 Complex Wound  Cleansing - multiple wounds X- 1 5 Wound Imaging (photographs - any number of wounds) _0  - 0 Wound Tracing (instead of photographs) X- 1 5 Simple Wound Measurement - one wound _1  - 0 Complex Wound Measurement - multiple wounds INTERVENTIONS - Wound Dressings X - Small Wound Dressing one or multiple wounds 1 10 _2  - 0 Medium Wound Dressing one or multiple wounds _3  - 0 Large Wound Dressing one or multiple wounds X- 1 5 Application of Medications - topical <EXHBZJIRCVELFYBO>_1<\/BPZWCHENIDPOEUMP>_5  - 0 Application of Medications - injection INTERVENTIONS - Miscellaneous _5  - 0 External ear exam _6  - 0 Specimen Collection (cultures, biopsies, blood, body fluids, etc.) _7  - 0 Specimen(s) / Culture(s) sent or taken to Lab for analysis _8  - 0 Patient Transfer (multiple staff / Civil Service fast streamer / Similar devices) _9  - 0 Simple Staple / Suture removal (25 or less) _10  - 0 Complex Staple / Suture removal (26 or more) _11  - 0 Hypo / Hyperglycemic Management (close monitor of Blood Glucose) _12  - 0 Ankle / Brachial Index (ABI) - do not check if billed separately X- 1 5 Vital Signs Has the patient been seen at the hospital within the last three years: Yes Total Score: 95 Level Of Care: New/Established - Level 3 Electronic Signature(s) Signed: 07/22/2019 5:12:22 PM By: Levan Hurst RN, BSN Entered By: Levan Hurst on 07/22/2019 11:44:01 -------------------------------------------------------------------------------- Lower Extremity Assessment Details Patient Name: Date of Service: [name], [name]. 07/22/2019 10:45 A M Medical Record Number: [id number] Patient Account Number: [id number] Date of Birth/Sex: Treating RN: [birth year] (85 y.o. [name] Primary Care Leah Skora: [name] Other Clinician: Referring Loria Lacina: Treating Adriona Kaney/Extender: [name] in Treatment: 13 Edema Assessment Assessed: [Left: No] Patrice Paradise: No] Edema: [Left: No] [Right: No] Calf Left: Right: Point of Measurement: cm From Medial Instep 33 cm 31.5 cm Ankle Left: Right: Point of Measurement: cm From Medial Instep 23.5 cm 22 cm Vascular Assessment Pulses: Dorsalis Pedis Palpable: [Left:Yes] [Right:Yes] Electronic Signature(s) Signed: 07/22/2019 4:34:07 PM By: Kela Millin Entered By: Kela Millin on 07/22/2019 11:12:16 -------------------------------------------------------------------------------- Multi-Disciplinary Care Plan Details Patient Name: Date of Service: [name], [name]. 07/22/2019 10:45 A M Medical Record Number: [id number] Patient Account Number: [id number] Date of Birth/Sex: Treating RN: 02-02-1935 (85 y.o. [name] Primary Care Marchetta Navratil: [name] Other Clinician: Referring Edge Mauger: Treating Messiah Rovira/Extender: [name] in Treatment: 13 Active Inactive Electronic Signature(s) Signed: 07/22/2019 5:12:22 PM By: Levan Hurst RN, BSN Entered By: Levan Hurst on 07/22/2019 11:27:08 -------------------------------------------------------------------------------- Pain Assessment Details Patient Name: Date of Service: [name], [name]. 07/22/2019 10:45 A M Medical Record Number: [id number] Patient Account Number: [id number] Date of Birth/Sex: Treating RN: 1934-04-22 (85 y.o. [name] Primary Care Jovaughn Wojtaszek: [name] Other Clinician: Referring Taher Vannote: Treating Zenovia Justman/Extender: [name] in Treatment: 13 Active Problems Location of Pain Severity and Description of Pain Patient Has Paino No Site Locations Pain Management and Medication Current Pain Management: Electronic Signature(s) Signed: 07/22/2019 5:12:22 PM By: Levan Hurst RN, BSN Signed: 08/01/2019 9:13:31 AM By: [name] Entered By: [name] on 07/22/2019  10:55:50 -------------------------------------------------------------------------------- Patient/Caregiver Education Details Patient Name: Date of Service: [name], [name]. 5/24/2021andnbsp10:45 A M Medical Record Number: [id number] Patient Account Number: [id number] Date of Birth/Gender: Treating RN: 1934-12-08 (85 y.o. [name] Primary Care Physician: [name] Other Clinician: Referring Physician: Treating Physician/Extender: [name] in Treatment: 36 Education Assessment Education  Provided To: Patient Education Topics Provided Wound/Skin Impairment: Methods: Explain/Verbal Responses: State content correctly Electronic Signature(s) Signed: 07/22/2019 5:12:22 PM By: Levan Hurst RN, BSN Entered By: Levan Hurst on 07/22/2019 11:27:41 -------------------------------------------------------------------------------- Wound Assessment Details Patient Name: Date of Service: [name], [name]. 07/22/2019 10:45 A M Medical Record Number: [id number] Patient Account Number: [id number] Date of Birth/Sex: Treating RN: Aug 21, 1934 (85 y.o. [name] Primary Care Nitya Cauthon: [name] Other Clinician: Referring Corby Villasenor: Treating Chasty Randal/Extender: [name] in Treatment: 13 Wound Status Wound Number: 1 Primary Etiology: Venous Leg Ulcer Wound Location: Right, Lateral Lower Leg Wound Status: Healed - Epithelialized Wounding Event: Trauma Comorbid History: Cataracts, Confinement Anxiety Date Acquired: 02/27/2019 Weeks Of Treatment: 13 Clustered Wound: No Wound Measurements Length: (cm) Width: (cm) Depth: (cm) Area: (cm) Volume: (cm) 0 % Reduction in Area: 100% 0 % Reduction in Volume: 100% 0 Epithelialization: Large (67-100%) 0 Tunneling: No 0 Undermining: No Wound Description Classification: Full Thickness Without Exposed Support Structures Wound Margin: Flat and Intact Exudate  Amount: None Present Foul Odor After Cleansing: No Slough/Fibrino No Wound Bed Granulation Amount: None Present (0%) Exposed Structure Necrotic Amount: None Present (0%) Fascia Exposed: No Fat Layer (Subcutaneous Tissue) Exposed: No Tendon Exposed: No Muscle Exposed: No Joint Exposed: No Bone Exposed: No Electronic Signature(s) Signed: 07/22/2019 4:34:07 PM By: Kela Millin Signed: 07/22/2019 5:12:22 PM By: Levan Hurst RN, BSN Entered By: Levan Hurst on 07/22/2019 11:25:38 -------------------------------------------------------------------------------- Wilson Details Patient Name: Date of Service: [name], [name]. 07/22/2019 10:45 A M Medical Record Number: [id number] Patient Account Number: [id number] Date of Birth/Sex: Treating RN: [birth year] (85 y.o. [name] Primary Care Gearldean Lomanto: [name] Other Clinician: Referring Hernando Reali: Treating Addysyn Fern/Extender: [name] in Treatment: 13 Vital Signs Time Taken: 10:55 Temperature (F): 97.8 Height (in): 64 Pulse (bpm): 90 Weight (lbs): 125 Respiratory Rate (breaths/min): 19 Body Mass Index (BMI): 21.5 Blood Pressure (mmHg): 146/83 Reference Range: 80 - 120 mg / dl Electronic Signature(s) Signed: 08/01/2019 9:13:31 AM By: [name] Entered By: [name] on 07/22/2019 10:55:44

## 2019-08-01 NOTE — Telephone Encounter (Signed)
Please give her a telephone visit in the morning tomorrow.  I will be at [facility] supervising a research infusion and also attending an investigator meeting via webinar.  When I have some downtime I will call her.  But please put her on the schedule and I will call her and document.  This will be tomorrow. 08/02/19

## 2019-08-02 ENCOUNTER — Telehealth: Payer: Self-pay | Admitting: Internal Medicine

## 2019-08-02 ENCOUNTER — Ambulatory Visit (INDEPENDENT_AMBULATORY_CARE_PROVIDER_SITE_OTHER): Payer: Medicare HMO | Admitting: Internal Medicine

## 2019-08-02 ENCOUNTER — Other Ambulatory Visit: Payer: Self-pay

## 2019-08-02 DIAGNOSIS — J849 Interstitial pulmonary disease, unspecified: Secondary | ICD-10-CM | POA: Diagnosis not present

## 2019-08-02 DIAGNOSIS — R600 Localized edema: Secondary | ICD-10-CM | POA: Diagnosis not present

## 2019-08-02 DIAGNOSIS — Z5181 Encounter for therapeutic drug level monitoring: Secondary | ICD-10-CM

## 2019-08-02 DIAGNOSIS — J9611 Chronic respiratory failure with hypoxia: Secondary | ICD-10-CM

## 2019-08-02 NOTE — Telephone Encounter (Signed)
Attempted to call pt multiple times but each time, I received a busy tone. I have sent pt a mychart message stating to her to call office to schedule an appt with MR in July. Also stated to her that she needs to have liver function checked anytime month of June. AVS has also been mailed to pt.nothing further needed.

## 2019-08-02 NOTE — Progress Notes (Signed)
IOV 03/21/2017  Chief Complaint  Patient presents with  . Advice Only    Former RA pt. Pt states she has been going to [facility] and is on 3-4L O2 with exertion and sometimes at night. Has SOB mainly with exertion.    [name] 85 y.o. female with interstitial lung disease.  History is gained from talking to her and review of the chart.  She was originally diagnosed with interstitial lung disease by Dr. Kara Mead in our practice.  Subsequently she transferred her care to [facility] Dr. [name] who has been following her.  Review of the chart shows the differential diagnosis is IPF versus chronic hypersensitivity pneumonitis based on some air trapping done in her high-resolution CT scan of the chest in 2017/2018.  She is known to have chronic stable interstitial lung disease on previous imaging dating as back as 2008 but she is only aware of her interstitial lung disease in the recent few years.  There is excellent documentation in Dr. [name] medical notes.  It appears based on the goals of care of the patient and her exertional desaturation that surgical lung biopsy was deferred and anti-fibrotic therapy was deferred.  Patient confirms the same.  She is extremely physically fit and thus good aerobic activity on 4 L of oxygen outpacing people much younger than her.  It appears over many years has been a gradual decline in her fibrosis and pulmonary health but she still extremely functional and does well.  She is mainly here today to have a pulmonary fibrosis resource in [place], [place].  She sees Dr. Lauris Chroman every 4 months.  She prefers to see Dr. Lauris Chroman and do all her monitoring in terms of her 6-minute walk test, pulmonary function test and her high-resolution CT scan at [facility] because she is very comfortable with the personnel there.  Yet because she is local in [place] she wants to tap into the resources here in case of an emergency.  We discussed  about alternating her follow-up between myself and Dr. Lauris Chroman every 4 months which would make her visit to either one place every 8 months and she seemed to think this was a good plan.  At this point as she is stable and she is satisfied with all pillars of our care.  She is not interested in pulmonary rehabilitation because rightfully so she exercises pretty actively.  She has been the patient support group and did not find value in it.  She is not a transplant candidate.  And she is deferred anti-fibrotic therapy.  She might or might not be interested in research trial and this was not discussed.  It appears the only new issue other than her overall stability is that for the last 1 year she has mild random dysphagia although no active acid reflux.  This can happen for liquids or solids or tablets and is very random.  She has not seen a gastroenterologist for this.  She is not on any proton pump inhibitors.  In addition she tells me she uses of feather down pillow all her life.  She is also use talcum powder all her life and with the recent lawsuit against [employer] about talcum powder she wonders if this could be contributory to her pulmonary fibrosis.  Otherwise [employer] of chest physicians interstitial lung disease questionnaire is positive for cigarette smoking from age 71 when she smoked a pack a week to age 42.  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  She did recently try a course of prednisone through Dr. Lauris Chroman for her interstitial lung disease but she did not seem to benefit from this and this is been slowly weaned off.  Her autoimmune antibody at [facility] and in [place] was normal.  Pulmonary function test from [facility] was reviewed and the images of CT scan of the chest done in [place] visualized  Walking desaturation test on 03/21/2017 185 feet x 3 laps on ROOM AIR:  did not desaturate. Rest pulse ox was 100%, final pulse ox was 88%. HR response  71/min at rest to 120/min at peak exertion. Patient [name]  Did yes Desaturate </= 88% . Kanai P Sime yes  Desaturated </= 3% points. Lenea P Bruington yes did get tachyardic  IMPRESSION: 1. Pulmonary parenchymal pattern of fibrosis is progressive, most indicative of usual interstitial pneumonitis (UIP). 2. Coronary artery calcification.   Electronically Signed   By: Lorin Picket M.D.   On: 10/27/2014 15:51 OV 06/21/2017   Chief Complaint  Patient presents with  . Follow-up    Pt saw Dr. Lauris Chroman 2/4.  Pt states she has been doing well up until 10 days ago when she developed a cold and is coughing, postnasal drainage and some mild chest tightness.   [name] presents for follow-up of interstitial lung disease [IPF most likely versus chronic HP less likely],. Is supportive care. She follows mostly at [facility] with Dr. [name]. I am her local resource. This is a scheduled appointment. Last seen by Dr. Lauris Chroman in February 2019. Notes reviewed and patient also confirms the same. It appears that without low-dose prednisone her cough relapsed. Therefore she went back on 5 mg prednisone and she was doing well in terms of her cough. However 10 days ago she developed a runny nose and cold and since then has increased cough compared to baseline. She also has a nasal twang. This a slight increase in sputum production compared to baseline and the sputum is off color. This is not normal for her. The might be some increased wheezing but his sleep quality is good. Overall effort tolerance continues to be good. She exercises with oxygen. At room air at rest she does not need oxygen. Most recently in February 2019 her ILD was deemed stable by Dr. Lauris Chroman. [place] she wants treatment recommendation for the current deterioration. She does not want to do high dose prednisone because of fear of hallucinationsthat have happened in the past. She also now  wants to alternate pulmonary fibrosis follow-up between [facility] and myself.   She also last several months c/o loss of taste and asking for directions in resolving this issue      OV 01/23/2018  Subjective:  Patient ID: [name], female , DOB: 31-May-1934 , age 25 y.o. , MRN: [id number] , ADDRESS: [place] [id number]   01/23/2018 -   Chief Complaint  Patient presents with  . Follow-up    States her breathing has been at her baseline. Denies chest pain or chest discomfort. States tesslon helps but it is too expensive.      HPI [facility] 85 y.o. -returns for follow-up of her interstitial lung disease that has a differential diagnosis between IPF and chronic hypersensitivity pneumonitis.  After seeing me last she saw Dr. Lauris Chroman in August 2019.  Since then she has been doing  stable.  I reviewed those notes.  Compared to recent visits her CT scan is stable and her walk test is stable.  She is able to manage with 2 L oxygen.  She has a portable system.  She does feel that she is unable to do more than she would like to do because of her ILD.  She does admit that compared to 3 years ago she says progressive ILD.  She also has significant cough for which she likes Ladona Ridgel but she says insurance is making him pay $3 a pill and so she is upset about that.  Review of immunization record shows that she has not had Pneumovax or flu shot this year but she says she is up-to-date.  We discussed the new [employer] trial for progressive non-IPF ILD where there was beneficial effect of nintedanib.  We discussed this and she is willing to try this after the new year 2020.  She wants to make sure that her [place] ILD specialist Dr. Lauris Chroman is on board with this.  We discussed the side effects in detail.    OV 03/08/2018  Subjective:  Patient ID: [name], female , DOB: [birth year] , age 43 y.o. , MRN: [id number] , ADDRESS: [place]  [id number]   03/08/2018 -   Chief Complaint  Patient presents with  . Follow-up    Pt states she has been well since last visit. States she does become SOB with exertion and does have an occ cough with clear mucus. Denies any CP.    ilD FOLLOWUP   HPI [facility] 85 y.o. -returns for followup. Over xmas 2019 because of social stress increased prednisone to 6m per day but now back to 567mper day. Dyspnea with exergtion continues but stable. Cough that is very bothersome continue.  She takes TeBest boyor this this helps her.  However it is expensive.  She is placed a call to [employer] apparently they are going to try to work out where she can make it more affordable because I ILD is a diagnosis.  She admitted that she has down pillows and feather blankets.  1 of her close friends Mr. TaLovena Leho also has hypersensitivity pneumonitis and sees me advised her to get rid of those and therefore she is in the process of doing that.  She asked about further options and treatment of a cough.  We discussed gabapentin no increasing steroid dose participating in a cough study but she did not want to add these complications to her.  She seems content with the option of trying an inspiratory muscle trainer and seeing if the cough would get better.  Overall given her progressive ILD and the possibility that this is IPF she is willing to now try nintedanib.  We discussed nintedanib extensively.  She denies any heart disease.  She is not on any major anticoagulation.  She does not have any GI issues other than constipation.  She is willing to try nintedanib.  She is nervous about the co-pay and said if the co-pay is too expensive despite charity program then she will not take it.  She [facility].          OV 01/17/2019  Subjective:  Patient ID: HoMammie Lorenzofemale , DOB: 4/11-02-1934 age 85.o. , MRN: [id number] [address] [id number]  01/17/2019 -   Chief Complaint  Patient presents with  . Follow-up    Pt states she has been okay since last visit. Pt states SOB has become worse and is coughing a lot.   Interstitial lung disease-IPF [UIP in August 2016 CT scan read locally] versus second distant possibility chronic hypersensitivity pneumonitis [feather pillow exposure and air trapping on CT scan per report]   HPI [name] 85 y.o. -last seen January 2020.  Since then in the last 6 months he has declined significantly.  She did see Dr. Lauris Chroman about a month ago at [facility].  I reviewed the chart.  Her shortness of breath with exertion has declined.  With exercise and walking she is using 4 L of oxygen.  Her cough also got worse.  He has increased the prednisone to 10 mg/day.  This is helped her cough.  Nevertheless she randomly gets severe coughing spells that makes her feel that she is going to die.  She is got a facemask oxygen is going to use that.  There was conversation about increasing the prednisone even further according to her history but she declined.  At this point in time she just wants to monitor the situation.  She certainly is open to taking more prednisone if the cough got worse.  She did have a high-resolution CT scan of the chest December 10, 2018 at [facility].  I reviewed the result.  Images not available for visualization.  They report definite persistent air trapping.  They also report some nodular opacities in the left lung.  Overall worsening fibrosis especially on the left greater than the right.  She did have an echocardiogram.  Her symptom scores currently are listed below and it shows significant level of symptoms.  We discussed antifibrotic nintedanib.  She says she is so frustrated because of the cost.  She says she tried a level best to get the cost down but could not.  She is pretty pessimistic that she will not be able to get a discount on the drug.   We discussed about the fact her disease continues to be progressive and to see if the cost would justify some of the benefit it might give.  She is willing to have me inquire about the cost.  We discussed research as a care option.  We discussed in detail the concept of research being voluntary.  The principles of randomization to placebo.  The need to do study visits.  Inclusion exclusion criteria.  She is willing to go through screening for any protocol for which she might qualify.  This will be possible after January 2020.  She had questions about the Covid vaccine.  She is social distancing.  She is afraid of getting Covid.     OV 04/22/2019  Subjective:  Patient ID: [name], female , DOB: 02/26/1935 , age 76 y.o. , MRN: [id number] , ADDRESS: [place] [id number]   04/22/2019 -   Chief Complaint  Patient presents with  . Follow-up    Pt states she has been doing well since last visit and states her breathing is stable.   Interstitial lung disease ( Dr Lauris Chroman and Dr [name])  -IPF [UIP in August 2016 CT scan read locally] versus   - chronic hypersensitivity pneumonitis [feather pillow exposure and air trapping on CT scan per report]  - started [facility] Oct 2017 - [facility]is Chroman  - Dickey Gave Jan 2021  - air purifier  since jan 2021   HPI [place] 85 y.o. -presents with her husband.  She says overall she is doing well.  She is now taking the nintedanib for the last 3 weeks.  She is entering the fourth week of the nintedanib.  She says since she started nintedanib cough is improved all the dyspnea is the same.  She now has air purifier at home.  Review of her symptom score shows improvement in her symptoms.  In terms of her nintedanib tolerance she is tolerating it fine.  She says so far no side effects.  Liver function test March 27, 2019 is normal.  She is interested in research protocols.  On this visit I was able to review her [place] records.  She last saw  Dr. Lauris Chroman in October 2020.  Dr. Lauris Chroman was concerned about her progression.  She did have a high-resolution CT chest that I cannot visualize the image myself because it was done at [facility] the report there is one that is more consistent with an alternative diagnosis to UIP and more consistent with chronic hypersensitivity pneumonitis.  As always a debate between IPF versus chronic hypersensitive pneumonitis in Ms. Dieudonne with the weight of the clinical evidence being more towards IPF per [facility] records.  I reviewed our records and so far we have never discussed in our multidisciplinary case conference.  Her walking desaturation test clearly shows deterioration compared to 1 year ago  She also told me a little bit of blurring of vision on and off with prednisone.  She wanted to know safe prednisone dose.  She feels 5 mg of prednisone does not control her symptoms well but 10 mg dose.  Her main new issue - new issue venous stasis RLE.  She has a nonhealing wound ulcer that is over an inch in size.  She is wrapped it with TED stockings and Ace wrap.  I am not able to see the wound.  She sees the wound care here.  She keeps the leg elevated intermittently during the daytime per their advice.  She is frustrated by this.    SYMPTOM SCALE - ILD Nov 2020 04/22/2019 - ofev since jan 2021   O2 use 4L with ex Portable with exertion  Shortness of Breath  0 -> 5 scale with 5 being worst (score 6 If unable to do)  At rest 2 0  Simple tasks - showers, clothes change, eating, shaving 2 1  Household (dishes, doing bed, laundry) 4 3.5  Shopping 3 2  Walking level at own pace 3 3  Walking up Stairs 5 4  Total (30-36) Dyspnea Score 19 11.5  How bad is your cough? 4 2.5 improved with ofev  How bad is your fatigue 2 1  How bad is nausea  0  How bad is vomiting?   0  How bad is diarrhea?  0  How bad is anxiety?  1  How bad is depression  0        Results for KENDRE, SIRES (MRN [id number]) as of  01/23/2018 17:05  Ref. Range 10/28/2014 16:39 10/03/2017 - [facility]/2018 [facility]  FVC-Pre Latest Units: L 2.19 1.85 1.64  FVC-%Pred-Pre Latest Units: % 84 69% 66   Results for KIMIKA, STREATER (MRN [id number]) as of 01/23/2018 17:05  Ref. Range 10/28/2014 16:39 01/23/2018  12/10/2018  DLCO unc Latest Units: ml/min/mmHg 12.16 8.36 5.68  DLCO unc % pred Latest Units: % 52 51% 30    Simple office  walk 250 feet x  3 laps goal with forehead probe 03/08/2018  04/22/2019   O2 used Room air Room air  Number laps completed 3 Only did 2 laps  Comments about pace brisk Mod pace  Resting Pulse Ox/HR 99% and 85/min 99% and 86/min  Final Pulse Ox/HR 90% and 124/min 87% and 119/min  Desaturated </= 88% no yes  Desaturated <= 3% points yes yues  Got Tachycardic >/= 90/min yes yes  Symptoms at end of test Moderate dyspnea Mod dyspnea  Miscellaneous comments x woprse     HRCT oct 2020 [facility]  Impression: 1. Slightly increased left greater than right lung pulmonary fibrosis favored to be due to chronic hypersensitivity pneumonitis. Increased superimposed nodular opacities in the left lung are favored to be related to worsening fibrosis with superimposed infection/malignancy not excluded, attention on follow-up.  Electronically Reviewed by:  Verdell Carmine, MD, [facility] Electronically Reviewed on:  12/10/2018 12:00 PM  I have reviewed the images and concur with the above findings.  Electronically Signed by:  Tessa Lerner, MDPhD,[facility] Electronically Signed on:  12/10/2018 5:39 PM   [facility]cho [facility] oct 2020 INTERPRETATION --------------------------------------------------------------- NORMAL LEFT VENTRICULAR SYSTOLIC FUNCTION WITH MILD LVH NORMAL RIGHT VENTRICULAR SYSTOLIC FUNCTION VALVULAR REGURGITATION: TRIVIAL MR, TRIVIAL PR, MILD TR NO VALVULAR STENOSIS MILD TO MODERATE TR NO PRIOR STUDY FOR COMPARISON   OV 08/02/2019 - telephone visit. Identified with 2PHI. Called patient on  her cell phone 11:58 AM   Subjective:  Patient ID: [name], female , DOB: [birth year] , age 64 y.o. , MRN: [id number] , ADDRESS: [place] [id number]  Interstitial lung disease ( Dr Lauris Chroman and Dr [name])  -IPF [UIP in August 2016 CT scan read locally - April 2021 [facility] - The [facility] read in 2011 and 2016 the odds of the scan being more consistent with hypersensitive pneumonitis is low.  The odds are this is UIP/IPF.  However October 2020 scans from [facility] would be most helpful.  Regardless there is progression and it is appropriate patients on antifibrotic therapy as she is right now] versus   - chronic hypersensitivity pneumonitis [feather pillow exposure and air trapping on CT scan per report]  - started steroids Oct 2017 - [facility] Dr Lauris Chroman  - Dickey Gave Jan 2021 - stopped full dose may 2021  - air purifier since jan 2021   08/02/2019 -     HPI [place] 85 y.o. - says she had a  Wound on right lower extremity following a bump in dec 2020. Did not heal. Jeris Penta to wound center for 3 months. Discharged a week ago from followup., DUring that time her RLE was wrapped in compression bandages. But now she is not on that. And since then ankles are swollen. Now she is on "Suppose" which helps. But as she removes it - > leg swelling gets worse. Swelling is bilateral but RLE > LLE.  Wnts to know iof ILD is causing this. Answered that is possible via [place] mechanism. Reviewed echo from oct 2020- > this did not show RV strain. Currently per her hx - does not sound like cellulitis or DVT (all chronic and unchanged). Nto currently interested in lasix.   Overall, sicne jan 2021 says ILD is worse and using more o2. Ngiht usingi 4L [place] at night and rest. Sometimes needs 5L [place] with exertion. When she goes to groceries - > takes portable o2 concentrator  Now on ofev  150 mg tablet - taking it once a day since 07/14/19. She is not sur if she can take bid / once daily  alternate day as yet. Wants this lower regime for 1 month and then try esclating  Also down to 48m prednisone Continue on tessalon perles  Doing ok on it  Oct 2020> [facility] from [facility] is not uploaded in our PACT    ROS - per HPI     has a past medical history of Hemorrhoids and IPF (idiopathic pulmonary fibrosis) ([place].   reports that she quit smoking about 32 years ago. Her smoking use included cigarettes. She has a 7.50 pack-year smoking history. She has never used smokeless tobacco.  Past Surgical History:  Procedure Laterality Date  . BREAST EXCISIONAL BIOPSY Left    x 2  . TONSILLECTOMY     age 85   Allergies  Allergen Reactions  . Prednisone     Hallucination when given in high doses     Immunization History  Administered Date(s) Administered  . Influenza, High Dose Seasonal PF 10/29/2016  . Influenza,inj,Quad PF,6+ Mos 11/05/2014  . Influenza-Unspecified 11/28/2013, 11/05/2014, 11/30/2015, 11/15/2018  . PFIZER SARS-COV-2 Vaccination 03/11/2019, 03/30/2019  . Pneumococcal Polysaccharide-23 02/10/2018  . Tdap 08/22/2016  . Zoster Recombinat (Shingrix) 09/22/2016, 12/22/2016    Family History  Problem Relation Age of Onset  . Stroke Father   . Lung cancer Brother        mets     Current Outpatient Medications:  .  aspirin 81 MG chewable tablet, Chew 81 mg by mouth daily., Disp: , Rfl:  .  atorvastatin (LIPITOR) 10 MG tablet, Take 10 mg by mouth daily., Disp: , Rfl:  .  benzonatate (TESSALON) 200 MG capsule, Take 1 capsule by mouth as needed., Disp: , Rfl:  .  calcium carbonate (OS-CAL) 600 MG TABS, Take 600 mg by mouth daily., Disp: , Rfl:  .  LINZESS 145 MCG CAPS capsule, , Disp: , Rfl:  .  Nintedanib (OFEV) 150 MG CAPS, Take 150 mg by mouth 2 (two) times daily., Disp: , Rfl:  .  predniSONE (DELTASONE) 10 MG tablet, PLEASE SEE ATTACHED FOR DETAILED DIRECTIONS, Disp: , Rfl:  .  predniSONE (DELTASONE) 10 MG tablet, 2 daily for 1 week, Disp: 14 tablet, Rfl:  0      Objective:   There were no vitals filed for this visit.  Estimated body mass index is 21.66 kg/m as calculated from the following:   Height as of 05/23/19: _0  (1.626 m).   Weight as of 05/23/19: 126 lb 3.2 oz (57.2 kg).  _1 @  There were no vitals filed for this visit.   Physical Exam Sounded normal      Assessment:       ICD-10-CM   1. ILD (interstitial lung disease) ([place]  J84.9   2. Therapeutic drug monitoring  Z51.81   3. Chronic respiratory failure with hypoxia (HCC)  J96.11   4. Pedal edema  R60.0        Plan:     Patient Instructions     ICD-10-CM   1. ILD (interstitial lung disease) ([place]  J84.9   2. Therapeutic drug monitoring  Z51.81   3. Chronic respiratory failure with hypoxia (HCC)  J96.11   4. Pedal edema  R60.0    Slowly progressive ILD Pedal edema - probably multifactorial Glad you are tolerating ofev 1556monce daily  Plan  - check LFT anytime in June 2021 - support decision to  hold off lasix for edema  - monitor edema for DVT/cellulitis - agree with compression stockings and leg eelvation  - monitor shortness of breath and o2 needs - continue ofev 148m once daily and in July discussi escalatin  Followup  - 30 min face to face visit in July 2021w ith Dr RChase Caller  - symptoms score and simple walk test on room air at followup   - discuss tyvaso as an option if meets criteria     SIGNATURE    Dr. MBrand Males M.D., F.C.C.P,  Pulmonary and Critical Care Medicine Staff Physician, CGloucester PointDirector - Interstitial Lung Disease  Program  Pulmonary [place] [id number] Pager: [phone] If no answer or between  15:00h - 7:00h: call [phone] Telephone: [phone]  11:58 AM 08/02/2019

## 2019-08-02 NOTE — Patient Instructions (Addendum)
ICD-10-CM   1. ILD (interstitial lung disease) ([place])  J84.9   2. Therapeutic drug monitoring  Z51.81   3. Chronic respiratory failure with hypoxia ([facility])  J96.11   4. Pedal edema  R60.0    Slowly progressive ILD Pedal edema - probably multifactorial Glad you are tolerating ofev 134m once daily  Plan  - check LFT anytime in June 2021 - support decision to hold off lasix for edema  - monitor edema for DVT/cellulitis - agree with compression stockings and leg eelvation  - monitor shortness of breath and o2 needs - continue ofev 1531monce daily and in July discussi escalatin  Followup  - 30 min face to face visit in July 2021w ith Dr RaChase Caller - symptoms score and simple walk test on room air at followup   - discuss tyvaso as an option if meets criteria

## 2019-08-02 NOTE — Telephone Encounter (Signed)
Raquel Sarna  Did phone visti 08/02/2019 - please see my instruction   Thanks    SIGNATURE    Dr. Brand Males, M.D., F.C.C.P,  Pulmonary and Critical Care Medicine Staff Physician, [place] Director - Interstitial Lung Disease  Program  [facility] [place], [place], [address]  Pager: [phone], If no answer or between  15:00h - 7:00h: call [phone] Telephone: [phone]  11:59 AM 08/02/2019

## 2019-08-05 ENCOUNTER — Other Ambulatory Visit (INDEPENDENT_AMBULATORY_CARE_PROVIDER_SITE_OTHER): Payer: Medicare HMO

## 2019-08-05 DIAGNOSIS — J849 Interstitial pulmonary disease, unspecified: Secondary | ICD-10-CM | POA: Diagnosis not present

## 2019-08-05 DIAGNOSIS — Z5181 Encounter for therapeutic drug level monitoring: Secondary | ICD-10-CM | POA: Diagnosis not present

## 2019-08-05 LAB — HEPATIC FUNCTION PANEL
ALT: 17 U/L (ref 0–35)
AST: 21 U/L (ref 0–37)
Albumin: 4 g/dL (ref 3.5–5.2)
Alkaline Phosphatase: 64 U/L (ref 39–117)
Bilirubin, Direct: 0.1 mg/dL (ref 0.0–0.3)
Total Bilirubin: 0.7 mg/dL (ref 0.2–1.2)
Total Protein: 6.7 g/dL (ref 6.0–8.3)

## 2019-08-05 NOTE — Progress Notes (Signed)
Lft normal

## 2019-08-09 ENCOUNTER — Telehealth: Payer: Self-pay | Admitting: Internal Medicine

## 2019-08-09 MED ORDER — DOXYCYCLINE HYCLATE 100 MG PO TABS: 100.0000 mg | ORAL_TABLET | Freq: Two times a day (BID) | ORAL | 0 refills | Status: DC

## 2019-08-09 NOTE — Telephone Encounter (Signed)
Can you please ask whether it was me or Dr. Lauris Chroman who took her off prednisone?  It is possible her ILD is acting up because she is off prednisone or it could just be viral/pollen/weather related mild flareup  Plan - Take prednisone 40 mg daily x 2 days, then 7m daily x 2 days, then 168mdaily x 2 days, then 2m58maily x 2 days and stop  -But there is a higher dose prednisone.  She normally does not like prednisone that much.  So if she wants  - She can just do prednisone 20 mg/day for 3 days and then go to 10 mg/day for a week and then go down to baseline 5 mg/day to maintain   -She can consider antibiotic especially if she is feeling congested - Take doxycycline 100m43m twice daily x 5 days; take after meals and avoid sunlight

## 2019-08-09 NOTE — Telephone Encounter (Signed)
Spoke with patient. She agreed to the prednisone 6m once a day for 3 days, followed by the 161mdaily for a week and remain on 26m59m  She stated that it was Dr. GilLauris Chromano taken her off of the prednisone completely but then placed her back on the 26mg76mily dose.   Due to the weekend approaching, she wishes to have the doxy called into. She will call [facility] if she feels like she needs it over the weekend.   Advised her to call us [place] if she is not feeling better. She verbalized understanding.   Nothing further needed at time of call.

## 2019-08-09 NOTE — Telephone Encounter (Signed)
Spoke with patient. She stated that she has developed a chest cold. She has a non productive cough that has increased over the past 2 days. She denied any increased SOB, fever, chills or body aches. She has a cough normally but this cough feels deeper than her usual cough. Also denies being around with covid and she is UTD on her vaccines.   She is not currently on prednisone but stated she was on a daily regimen a few months ago but it was discontinued. She wants to know if she needs to go back on it.   [facility].   MR, please advise. Thanks!

## 2019-08-13 DIAGNOSIS — J849 Interstitial pulmonary disease, unspecified: Secondary | ICD-10-CM | POA: Diagnosis not present

## 2019-08-15 NOTE — Telephone Encounter (Signed)
Received the following message from patient:   "I have had a cold and cough for a week.  As directed,   I increased the prednisone to 20 mg and now reducing it to 10 mg.  I started taking the antibiotic on Sunday because of the congestion. The  last dose is today. My chest is rumbling!!  I still have a good deal of yellowish green phlegm that I am able to cough up.  I have not had a fever. Do you have any thinking else you want me to do beside just ride it out?     Thank you.  [facility]"  Per her chart, it looks like Doxy 132m BID for 5 days was called in for her.   MR, please advise. Thanks!

## 2019-08-16 MED ORDER — CEPHALEXIN 500 MG PO CAPS: 500.0000 mg | ORAL_CAPSULE | Freq: Three times a day (TID) | ORAL | 0 refills | Status: DC

## 2019-08-16 MED ORDER — METHYLPREDNISOLONE 4 MG PO TBPK: ORAL_TABLET | ORAL | 0 refills | Status: DC

## 2019-08-16 NOTE — Telephone Encounter (Signed)
[place] for steroid plan per [facility]  And send cephalexin 565m tid x 5 days - as reserve for patient. She can use over weekend if needed

## 2019-08-16 NOTE — Telephone Encounter (Signed)
The only other thing I can think of is extended prednisone - she has to really go on larger dose or extended taper bu I did not recommend that because of hallucination issue. She can try a medrol (not pred) dose pak but start at day 2 (instead of day 1) and stop at day 6 (total 5 days)  And also try another antibiotic   - cephalexin 536m three times daily x  5 days   Allergies  Allergen Reactions  . Prednisone     Hallucination when given in high doses

## 2019-08-20 DIAGNOSIS — J849 Interstitial pulmonary disease, unspecified: Secondary | ICD-10-CM | POA: Diagnosis not present

## 2019-09-12 DIAGNOSIS — J849 Interstitial pulmonary disease, unspecified: Secondary | ICD-10-CM | POA: Diagnosis not present

## 2019-09-19 DIAGNOSIS — J849 Interstitial pulmonary disease, unspecified: Secondary | ICD-10-CM | POA: Diagnosis not present

## 2019-09-23 ENCOUNTER — Encounter: Payer: Self-pay | Admitting: Internal Medicine

## 2019-09-23 ENCOUNTER — Other Ambulatory Visit: Payer: Self-pay

## 2019-09-23 ENCOUNTER — Ambulatory Visit: Payer: Medicare HMO | Admitting: Internal Medicine

## 2019-09-23 VITALS — BP 120/70 | HR 69 | Ht 64.0 in | Wt 125.8 lb

## 2019-09-23 DIAGNOSIS — Z5181 Encounter for therapeutic drug level monitoring: Secondary | ICD-10-CM

## 2019-09-23 DIAGNOSIS — J849 Interstitial pulmonary disease, unspecified: Secondary | ICD-10-CM | POA: Diagnosis not present

## 2019-09-23 DIAGNOSIS — J9611 Chronic respiratory failure with hypoxia: Secondary | ICD-10-CM | POA: Diagnosis not present

## 2019-09-23 NOTE — Progress Notes (Signed)
IOV 03/21/2017  Chief Complaint  Patient presents with  . Advice Only    Former RA pt. Pt states she has been going to [facility] and is on 3-4L O2 with exertion and sometimes at night. Has SOB mainly with exertion.    [name] 85 y.o. female with interstitial lung disease.  History is gained from talking to her and review of the chart.  She was originally diagnosed with interstitial lung disease by Dr. Kara Mead in our practice.  Subsequently she transferred her care to [facility] Dr. [name] who has been following her.  Review of the chart shows the differential diagnosis is IPF versus chronic hypersensitivity pneumonitis based on some air trapping done in her high-resolution CT scan of the chest in 2017/2018.  She is known to have chronic stable interstitial lung disease on previous imaging dating as back as 2008 but she is only aware of her interstitial lung disease in the recent few years.  There is excellent documentation in Dr. [name] medical notes.  It appears based on the goals of care of the patient and her exertional desaturation that surgical lung biopsy was deferred and anti-fibrotic therapy was deferred.  Patient confirms the same.  She is extremely physically fit and thus good aerobic activity on 4 L of oxygen outpacing people much younger than her.  It appears over many years has been a gradual decline in her fibrosis and pulmonary health but she still extremely functional and does well.  She is mainly here today to have a pulmonary fibrosis resource in [place], [place].  She sees Dr. Lauris Chroman every 4 months.  She prefers to see Dr. Lauris Chroman and do all her monitoring in terms of her 6-minute walk test, pulmonary function test and her high-resolution CT scan at [facility] because she is very comfortable with the personnel there.  Yet because she is local in [place] she wants to tap into the resources here in case of an emergency.  We discussed  about alternating her follow-up between myself and Dr. Lauris Chroman every 4 months which would make her visit to either one place every 8 months and she seemed to think this was a good plan.  At this point as she is stable and she is satisfied with all pillars of our care.  She is not interested in pulmonary rehabilitation because rightfully so she exercises pretty actively.  She has been the patient support group and did not find value in it.  She is not a transplant candidate.  And she is deferred anti-fibrotic therapy.  She might or might not be interested in research trial and this was not discussed.  It appears the only new issue other than her overall stability is that for the last 1 year she has mild random dysphagia although no active acid reflux.  This can happen for liquids or solids or tablets and is very random.  She has not seen a gastroenterologist for this.  She is not on any proton pump inhibitors.  In addition she tells me she uses of feather down pillow all her life.  She is also use talcum powder all her life and with the recent lawsuit against [employer] about talcum powder she wonders if this could be contributory to her pulmonary fibrosis.  Otherwise [employer] of chest physicians interstitial lung disease questionnaire is positive for cigarette smoking from age 71 when she smoked a pack a week to age 42.  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  She did recently try a course of prednisone through Dr. Lauris Chroman for her interstitial lung disease but she did not seem to benefit from this and this is been slowly weaned off.  Her autoimmune antibody at [facility] and in [place] was normal.  Pulmonary function test from [facility] was reviewed and the images of CT scan of the chest done in [place] visualized  Walking desaturation test on 03/21/2017 185 feet x 3 laps on ROOM AIR:  did not desaturate. Rest pulse ox was 100%, final pulse ox was 88%. HR response  71/min at rest to 120/min at peak exertion. Patient [name]  Did yes Desaturate </= 88% . [name] yes  Desaturated </= 3% points. [name] yes did get tachyardic  IMPRESSION: 1. Pulmonary parenchymal pattern of fibrosis is progressive, most indicative of usual interstitial pneumonitis (UIP). 2. Coronary artery calcification.   Electronically Signed   By: Lorin Picket M.D.   On: 10/27/2014 15:51 OV 06/21/2017   Chief Complaint  Patient presents with  . Follow-up    Pt saw Dr. Lauris Chroman 2/4.  Pt states she has been doing well up until 10 days ago when she developed a cold and is coughing, postnasal drainage and some mild chest tightness.   [name] presents for follow-up of interstitial lung disease [IPF most likely versus chronic HP less likely],. Is supportive care. She follows mostly at [facility] with Dr. [name]. I am her local resource. This is a scheduled appointment. Last seen by Dr. Lauris Chroman in February 2019. Notes reviewed and patient also confirms the same. It appears that without low-dose prednisone her cough relapsed. Therefore she went back on 5 mg prednisone and she was doing well in terms of her cough. However 10 days ago she developed a runny nose and cold and since then has increased cough compared to baseline. She also has a nasal twang. This a slight increase in sputum production compared to baseline and the sputum is off color. This is not normal for her. The might be some increased wheezing but his sleep quality is good. Overall effort tolerance continues to be good. She exercises with oxygen. At room air at rest she does not need oxygen. Most recently in February 2019 her ILD was deemed stable by Dr. Lauris Chroman. [place] the [place] days she wants treatment recommendation for the current deterioration. She does not want to do high dose prednisone because of fear of hallucinationsthat have happened in the past. She also now  wants to alternate pulmonary fibrosis follow-up between [facility] and myself.   She also last several months c/o loss of taste and asking for directions in resolving this issue      OV 01/23/2018  Subjective:  Patient ID: [name], female , DOB: Sep 09, 1934 , age 89 y.o. , MRN: [id number] , ADDRESS: [place] [id number]   01/23/2018 -   Chief Complaint  Patient presents with  . Follow-up    States her breathing has been at her baseline. Denies chest pain or chest discomfort. States tesslon helps but it is too expensive.      HPI [facility] 85 y.o. -returns for follow-up of her interstitial lung disease that has a differential diagnosis between IPF and chronic hypersensitivity pneumonitis.  After seeing me last she saw Dr. Lauris Chroman in August 2019.  Since then she has been doing  stable.  I reviewed those notes.  Compared to recent visits her CT scan is stable and her walk test is stable.  She is able to manage with 2 L oxygen.  She has a portable system.  She does feel that she is unable to do more than she would like to do because of her ILD.  She does admit that compared to 3 years ago she says progressive ILD.  She also has significant cough for which she likes Ladona Ridgel but she says insurance is making him pay $3 a pill and so she is upset about that.  Review of immunization record shows that she has not had Pneumovax or flu shot this year but she says she is up-to-date.  We discussed the new [employer] trial for progressive non-IPF ILD where there was beneficial effect of nintedanib.  We discussed this and she is willing to try this after the new year 2020.  She wants to make sure that her [place] ILD specialist Dr. Lauris Chroman is on board with this.  We discussed the side effects in detail.    OV 03/08/2018  Subjective:  Patient ID: [name], female , DOB: [birth year] , age 43 y.o. , MRN: [id number] , ADDRESS: [place]  [id number]   03/08/2018 -   Chief Complaint  Patient presents with  . Follow-up    Pt states she has been well since last visit. States she does become SOB with exertion and does have an occ cough with clear mucus. Denies any CP.    ilD FOLLOWUP   HPI [facility] 85 y.o. -returns for followup. Over xmas 2019 because of social stress increased prednisone to 6m per day but now back to 567mper day. Dyspnea with exergtion continues but stable. Cough that is very bothersome continue.  She takes Te[employer] boyor this this helps her.  However it is expensive.  She is placed a call to [employer] apparently they are going to try to work out where she can make it more affordable because I ILD is a diagnosis.  She admitted that she has down pillows and feather blankets.  1 of her close friends Mr. TaLovena Conway also has hypersensitivity pneumonitis and sees me advised her to get rid of those and therefore she is in the process of doing that.  She asked about further options and treatment of a cough.  We discussed gabapentin no increasing steroid dose participating in a cough study but she did not want to add these complications to her.  She seems content with the option of trying an inspiratory muscle trainer and seeing if the cough would get better.  Overall given her progressive ILD and the possibility that this is IPF she is willing to now try nintedanib.  We discussed nintedanib extensively.  She denies any heart disease.  She is not on any major anticoagulation.  She does not have any GI issues other than constipation.  She is willing to try nintedanib.  She is nervous about the co-pay and said if the co-pay is too expensive despite charity program then she will not take it.  She is [facility].          OV 01/17/2019  Subjective:  Patient ID: HoMammie Lorenzofemale , DOB: 4/11-02-1934 age 85.o. , MRN: [id number] [address] [id number]  01/17/2019 -   Chief Complaint  Patient presents with  . Follow-up    Pt states she has been okay since last visit. Pt states SOB has become worse and is coughing a lot.   Interstitial lung disease-IPF [UIP in August 2016 CT scan read locally] versus second distant possibility chronic hypersensitivity pneumonitis [feather pillow exposure and air trapping on CT scan per report]   HPI [facility] 85 y.o. -last seen January 2020.  Since then in the last 6 months he has declined significantly.  She did see Dr. Lauris Chroman about a month ago at [facility].  I reviewed the chart.  Her shortness of breath with exertion has declined.  With exercise and walking she is using 4 L of oxygen.  Her cough also got worse.  He has increased the prednisone to 10 mg/day.  This is helped her cough.  Nevertheless she randomly gets severe coughing spells that makes her feel that she is going to die.  She is got a facemask oxygen is going to use that.  There was conversation about increasing the prednisone even further according to her history but she declined.  At this point in time she just wants to monitor the situation.  She certainly is open to taking more prednisone if the cough got worse.  She did have a high-resolution CT scan of the chest December 10, 2018 at [facility].  I reviewed the result.  Images not available for visualization.  They report definite persistent air trapping.  They also report some nodular opacities in the left lung.  Overall worsening fibrosis especially on the left greater than the right.  She did have an echocardiogram.  Her symptom scores currently are listed below and it shows significant level of symptoms.  We discussed antifibrotic nintedanib.  She says she is so frustrated because of the cost.  She says she tried a level best to get the cost down but could not.  She is pretty pessimistic that she will not be able to get a discount on the drug.   We discussed about the fact her disease continues to be progressive and to see if the cost would justify some of the benefit it might give.  She is willing to have me inquire about the cost.  We discussed research as a care option.  We discussed in detail the concept of research being voluntary.  The principles of randomization to placebo.  The need to do study visits.  Inclusion exclusion criteria.  She is willing to go through screening for any protocol for which she might qualify.  This will be possible after January 2020.  She had questions about the Covid vaccine.  She is social distancing.  She is afraid of getting Covid.     OV 04/22/2019  Subjective:  Patient ID: [name], female , DOB: 01/17/1935 , age 77 y.o. , MRN: [id number] , ADDRESS: [place] [id number]   04/22/2019 -   Chief Complaint  Patient presents with  . Follow-up    Pt states she has been doing well since last visit and states her breathing is stable.   Interstitial lung disease ( Dr Lauris Chroman and Dr [name])  -IPF [UIP in August 2016 CT scan read locally] versus   - chronic hypersensitivity pneumonitis [feather pillow exposure and air trapping on CT scan per report]  - started steroids Oct 2017 - [facility] Dr Lauris Chroman  - Dickey Gave Jan 2021  - air purifier  since jan 2021   HPI [place] 85 y.o. -presents with her husband.  She says overall she is doing well.  She is now taking the nintedanib for the last 3 weeks.  She is entering the fourth week of the nintedanib.  She says since she started nintedanib cough is improved all the dyspnea is the same.  She now has air purifier at home.  Review of her symptom score shows improvement in her symptoms.  In terms of her nintedanib tolerance she is tolerating it fine.  She says so far no side effects.  Liver function test March 27, 2019 is normal.  She is interested in research protocols.  On this visit I was able to review her [place] records.  She last saw  Dr. Lauris Chroman in October 2020.  Dr. Lauris Chroman was concerned about her progression.  She did have a high-resolution CT chest that I cannot visualize the image myself because it was done at [facility] the report there is one that is more consistent with an alternative diagnosis to UIP and more consistent with chronic hypersensitivity pneumonitis.  As always a debate between IPF versus chronic hypersensitive pneumonitis in [place] with the weight of the clinical evidence being more towards IPF per [facility] records.  I reviewed our records and so far we have never discussed in our multidisciplinary case conference.  Her walking desaturation test clearly shows deterioration compared to 1 year ago  She also told me a little bit of blurring of vision on and off with prednisone.  She wanted to know safe prednisone dose.  She feels 5 mg of prednisone does not control her symptoms well but 10 mg dose.  Her main new issue - new issue venous stasis RLE.  She has a nonhealing wound ulcer that is over an inch in size.  She is wrapped it with TED stockings and Ace wrap.  I am not able to see the wound.  She sees the wound care here.  She keeps the leg elevated intermittently during the daytime per their advice.  She is frustrated by this.   HRCT oct 2020 [facility]  Impression: 1. Slightly increased left greater than right lung pulmonary fibrosis favored to be due to chronic hypersensitivity pneumonitis. Increased superimposed nodular opacities in the left lung are favored to be related to worsening fibrosis with superimposed infection/malignancy not excluded, attention on follow-up.  Electronically Reviewed by:  Verdell Carmine, MD, [facility] Electronically Reviewed on:  12/10/2018 12:00 PM  I have reviewed the images and concur with the above findings.  Electronically Signed by:  Tessa Lerner, MDPhD,[facility] Electronically Signed on:  12/10/2018 5:39 PM   [facility]cho [facility] oct 2020 INTERPRETATION  --------------------------------------------------------------- NORMAL LEFT VENTRICULAR SYSTOLIC FUNCTION WITH MILD LVH NORMAL RIGHT VENTRICULAR SYSTOLIC FUNCTION VALVULAR REGURGITATION: TRIVIAL MR, TRIVIAL PR, MILD TR NO VALVULAR STENOSIS MILD TO MODERATE TR NO PRIOR STUDY FOR COMPARISON   OV 08/02/2019 - telephone visit. Identified with 2PHI. Called patient on her cell phone 11:58 AM   Subjective:  Patient ID: [name], female , DOB: 02-19-1935 , age 44 y.o. , MRN: [id number] , ADDRESS: [place] [id number]  Interstitial lung disease ( Dr Lauris Chroman and Dr [name])  -IPF [UIP in August 2016 CT scan read locally - [facility] read in 2011 and 2016 the odds of the scan being more consistent with hypersensitive pneumonitis is low.  The odds are  this is UIP/IPF.  However October 2020 scans from [facility] would be most helpful.  Regardless there is progression and it is appropriate patients on antifibrotic therapy as she is right now] versus   - chronic hypersensitivity pneumonitis [feather pillow exposure and air trapping on CT scan per report]  - started steroids Oct 2017 - [facility] Dr Lauris Chroman  - Dickey Gave Jan 2021 - stopped full dose may 2021  - air purifier since jan 2021   08/02/2019 -     HPI [place] 85 y.o. - says she had a  Wound on right lower extremity following a bump in dec 2020. Did not heal. [name] to wound center for 3 months. Discharged a week ago from followup., DUring that time her RLE was wrapped in compression bandages. But now she is not on that. And since then ankles are swollen. Now she is on "Suppose" which helps. But as she removes it - > leg swelling gets worse. Swelling is bilateral but RLE > LLE.  Wnts to know iof ILD is causing this. Answered that is possible via Cable mechanism. Reviewed echo from oct 2020- > this did not show RV strain. Currently per her hx - does not sound like cellulitis or DVT (all  chronic and unchanged). Nto currently interested in lasix.   Overall, sicne jan 2021 says ILD is worse and using more o2. Ngiht usingi 4L [place] at night and rest. Sometimes needs 5L [place] with exertion. When she goes to groceries - > takes portable o2 concentrator  Now on ofev 150 mg tablet - taking it once a day since 07/14/19. She is not sur if she can take bid / once daily alternate day as yet. Wants this lower regime for 1 month and then try esclating  Also down to 54m prednisone Continue on tessalon perles  Doing ok on it  Oct 2020> [facility] is not uploaded in our PACT    ROS  -  OV 09/23/2019   Subjective:  Patient ID: HMammie Conway female , DOB: [birth year] age 85y.o. years. , MRN: [id number]  ADDRESS: [address] [id number]  GLavone Orn MD Providers : Treatment Team:  Attending Provider: RBrand Males MD   Interstitial lung disease ( Dr GLauris Chromanand Dr RChase Caller  -IPF [UIP in August 2016 CT scan read locally - [facility] the odds of the scan being more consistent with hypersensitive pneumonitis is low.  The odds are this is UIP/IPF.  [facility]  Regardless there is progression and it is appropriate patients on antifibrotic therapy as she is right now] versus   - chronic hypersensitivity pneumonitis [feather pillow exposure and air trapping on CT scan per report]  - started [facility] Oct 2017 - [facility] Dr GLauris Chroman - oDickey GaveJan 2021 - stopped full dose may 2021 -> since then 1532monce daily  - air purifier since jan 2021     09/23/2019 -returns for follow-up.  At this visit she tells me that her last [facility].  He is now no longer seeing ILD patients or any patients and therefore she would just will keep a follow-up with me.  She is taking prednisone 10  mg/day although splitting it is 5 mg twice daily.  She says this gives her more energy.  She says she is using 4-5 L at rest and continuously although today when we walked at room air at rest she was fine and she only dropped to 88% after walking 2 laps and went down to 84% after walking 3 laps.  The distance is shown below.  She wants to qualify for at least 10 L oxygen.  I have advised her she may need to come for oxygen titration test.  In terms of her nintedanib she is taking 150 mg once daily.  She tried to go to an average of 200 mg total for the day by taking 150 mg twice daily Monday Wednesday Friday and then taking 150 mg once daily on Tuesday Thursday and Saturday and rotating between the 2 on Sunday.  However she says discussed lot of diarrhea and she stopped it.  Her weight appears stable.  She wants to see if she can escalate to a slightly higher dose on the nintedanib because I have advised her that 150 mg once daily is subtherapeutic.  She does have some foot numbness problems.  I have suggested she talk this with the primary care physician.  Her symptom score and PFT shows a steady progressive decline in her ILD.  Her symptom score shows continued worsening.  SYMPTOM SCALE - ILD Nov 2020 04/22/2019 - ofev since jan 2021  09/23/2019 125# ofev 134m daily  O2 use 4L with ex Portable with exertion   Shortness of Breath  0 -> 5 scale with 5 being worst (score 6 If unable to do)   At rest 2 0 3  Simple tasks - showers, clothes change, eating, shaving _0 Household (dishes, doing bed, laundry) 4 3.5 4  Shopping _1 Walking level at own pace _2 Walking up Stairs _3 Total (30-36) Dyspnea Score 19 11.5 22  How bad is your cough? 4 2.5 improved with ofev 2.5  How bad is your fatigue _4 How bad is nausea  0 0  How bad is vomiting?   0 0  How bad is diarrhea?  0 2  How bad is anxiety?  1 0  How bad is depression  0 0        Results for CKYNEDI, PROFITT(MRN  [id number] as of 01/23/2018 17:05  Ref. Range 10/28/2014 16:39 10/[facility]/2017 [facility] 12/10/2018 [facility]4/[facility]  FVC-Pre Latest Units: L 2.19 1.85 1.64 1.58L  FVC-%Pred-Pre Latest Units: % 84 69% 66    Results for CJOSCLYN, ROSALES(MRN [id number] as of 01/23/2018 17:05  Ref. Range 10/28/2014 16:39 01/23/2018  12/10/2018 4.4/21  DLCO unc Latest Units: ml/min/mmHg 12.16 8.36 5.68 6.6  DLCO unc % pred Latest Units: % 52 51% 30     Simple office walk 250 feet x  3 laps goal with forehead probe 03/08/2018  04/22/2019  09/23/2019   O2 used Room air Room air ra  Number laps completed 3 Only did 2 laps Did 2 laps and desaturated at end of 2nd lap but kept walking to 3.5laps  Comments about pace brisk Mod pace Mod pace  Resting Pulse Ox/HR 99% and 85/min 99% and 86/min 998% and HR 80/min  Final Pulse Ox/HR 90% and 124/min 87% and 119/min 88% and HR 112/min @ end of 2nd laps -> 84% at 3.5 laps without staopping / HR 116/min  Desaturated </= 88% no yes  Desaturated <= 3% points yes yues   Got Tachycardic >/= 90/min yes yes   Symptoms at end of test Moderate dyspnea Mod dyspnea Mo dyspnea  Miscellaneous comments x woprse        has a past medical history of Hemorrhoids and IPF (idiopathic pulmonary fibrosis) ([place]).   reports that she quit smoking about 32 years ago. Her smoking use included cigarettes. She has a 7.50 pack-year smoking history. She has never used smokeless tobacco.  Past Surgical History:  Procedure Laterality Date  . BREAST EXCISIONAL BIOPSY Left    x 2  . TONSILLECTOMY     age 25    Allergies  Allergen Reactions  . Prednisone     Hallucination when given in high doses     Immunization History  Administered Date(s) Administered  . Influenza, High Dose Seasonal PF 10/29/2016  . Influenza,inj,Quad PF,6+ Mos 11/05/2014  . Influenza-Unspecified 11/28/2013, 11/05/2014, 11/30/2015, 11/15/2018  . PFIZER SARS-COV-2 Vaccination 03/11/2019, 03/30/2019  . Pneumococcal  Polysaccharide-23 02/10/2018  . Tdap 08/22/2016  . Zoster Recombinat (Shingrix) 09/22/2016, 12/22/2016    Family History  Problem Relation Age of Onset  . Stroke Father   . Lung cancer Brother        mets     Current Outpatient Medications:  .  aspirin 81 MG EC tablet, Take 81 mg by mouth daily. , Disp: , Rfl:  .  atorvastatin (LIPITOR) 20 MG tablet, Take 10 mg by mouth daily. , Disp: , Rfl:  .  benzonatate (TESSALON) 200 MG capsule, Take 1 capsule by mouth as needed., Disp: , Rfl:  .  calcium carbonate (OS-CAL) 600 MG TABS, Take 600 mg by mouth daily., Disp: , Rfl:  .  LINZESS 145 MCG CAPS capsule, , Disp: , Rfl:  .  Nintedanib (OFEV) 150 MG CAPS, Take 150 mg by mouth daily. , Disp: , Rfl:  .  predniSONE (DELTASONE) 10 MG tablet, Take 10 mg by mouth daily. , Disp: , Rfl:       Objective:   Vitals:   09/23/19 1106  BP: 120/70  Pulse: 69  SpO2: 95%  Weight: 125 lb 12.8 oz (57.1 kg)  Height: _0  (1.626 m)    Estimated body mass index is 21.59 kg/m as calculated from the following:   Height as of this encounter: _1  (1.626 m).   Weight as of this encounter: 125 lb 12.8 oz (57.1 kg).  _2 @  [employer]   09/23/19 1106  Weight: 125 lb 12.8 oz (57.1 kg)     Physical Exam Pleasant female.  Able to talk full sentences normal oral cavity bilateral bibasal crackles Velcro crackles present.  No cyanosis no clubbing.  Mild venous stasis edema no stigmata of connective tissue disease        Assessment:       ICD-10-CM   1. ILD (interstitial lung disease) ([place])  J84.9   2. Therapeutic drug monitoring  Z51.81   3. Chronic respiratory failure with hypoxia (HCC)  J96.11        Plan:     Patient Instructions     ICD-10-CM   1. ILD (interstitial lung disease) ([place])  J84.9   2. Therapeutic drug monitoring  Z51.81   3. Chronic respiratory failure with hypoxia (HCC)  J96.11     Interstitial lung disease is slowly progressive over time Understand  that from now on we will not go to [facility] and I will remain your primary [place] to  continue prednisone 10 mg/day and okay to split it as 5 mg twice daily because this helps her symptoms of fatigue and cough  -Nintedanib at 150 mg once daily is probably subtherapeutic  -Try escalation to 100 mg twice daily (total daily dose of 200 mg daily] -I have given you a donor sample of 1 month supply to try this to see if you have side effects  -Please call in 21 days - 28 days to report any side effects with this  -Use oxygen at rest and with exertion and at night  -You might need to return for a full oxygen titration qualification test if you want to use more than 10 L concentrator  -No need for liver function test today [most recent one was in June 2021]  Follow-up -Depending on CMA you might have to return for oxygen titration test -3 months interstitial lung disease clinic a 30-minute face-to-face visit with Dr. [name]  -Symptom score and walking desaturation test at the time of follow-up      Given ofev sample- donor sample - 146m cap, x 60 caps.  GMelvern [id number]  [id number] August 2022 Lot space [id number]  ( Level 05 visit: Estb 40-54 min   in  visit type: on-site physical face to visit  in total care time and counseling or/and coordination of care by this undersigned MD - Dr MBrand Males This includes one or more of the following on this same day 09/23/2019: pre-charting, chart review, note writing, documentation discussion of test results, diagnostic or treatment recommendations, prognosis, risks and benefits of management options, instructions, education, compliance or risk-factor reduction. It excludes time spent by the [facility] office staff in the care of the patient. Actual time 427min)  SIGNATURE    Dr. MBrand Males M.D., F.C.C.P,  Pulmonary and Critical Care Medicine Staff Physician, CLong ValleyDirector -  Interstitial Lung Disease  Program  Pulmonary [place] [address] Pager: [phone] If no answer or between  15:00h - 7:00h: call [phone] Telephone: [phone]  12:02 PM 09/23/2019

## 2019-09-23 NOTE — Telephone Encounter (Signed)
Patient message from [name], please advise.  I am curious!   How is the effectiveness of OFEV measured and by whom?    How do "they" know that 263m is the least that should be taken to be helpful?  I thought it was very difficult to determine how much good it was doing. I should have asked you in the office!    Thanks.   [facility]

## 2019-09-23 NOTE — Patient Instructions (Addendum)
ICD-10-CM   1. ILD (interstitial lung disease) ([place])  J84.9   2. Therapeutic drug monitoring  Z51.81   3. Chronic respiratory failure with hypoxia (HCC)  J96.11     Interstitial lung disease is slowly progressive over time Understand that from now on we will not go to [facility] and I will remain your primary pulmonologist  Plan -Okay to continue prednisone 10 mg/day and okay to split it as 5 mg twice daily because this helps her symptoms of fatigue and cough  -Nintedanib at 150 mg once daily is probably subtherapeutic  -Try escalation to 100 mg twice daily (total daily dose of 200 mg daily] -I have given you a donor sample of 1 month supply to try this to see if you have side effects  -Please call in 21 days - 28 days to report any side effects with this  -Use oxygen at rest and with exertion and at night  -You might need to return for a full oxygen titration qualification test if you want to use more than 10 L concentrator  - as of 09/23/2019 - ti seems you do not need o2 at rest and you drop only after completing approx 300 feet + of walking on room air  -No need for liver function test today [most recent one was in June 2021]  Follow-up -Depending on CMA you might have to return for oxygen titration test - 3 months do spiro and dLCO -3 months interstitial lung disease clinic a 30-minute face-to-face visit with Dr. [name]  -Symptom score and simple walking desaturation test at the time of follow-up

## 2019-09-24 NOTE — Telephone Encounter (Signed)
It is based on phase 2 NEJM study published in 2011 - https://www.nejm.org/doi/full/10.1056/nejmoa1103690

## 2019-09-30 DIAGNOSIS — J84112 Idiopathic pulmonary fibrosis: Secondary | ICD-10-CM | POA: Diagnosis not present

## 2019-09-30 DIAGNOSIS — I2510 Atherosclerotic heart disease of native coronary artery without angina pectoris: Secondary | ICD-10-CM | POA: Diagnosis not present

## 2019-09-30 DIAGNOSIS — Z7189 Other specified counseling: Secondary | ICD-10-CM | POA: Diagnosis not present

## 2019-09-30 DIAGNOSIS — Z0000 Encounter for general adult medical examination without abnormal findings: Secondary | ICD-10-CM | POA: Diagnosis not present

## 2019-09-30 DIAGNOSIS — M8580 Other specified disorders of bone density and structure, unspecified site: Secondary | ICD-10-CM | POA: Diagnosis not present

## 2019-09-30 DIAGNOSIS — E7800 Pure hypercholesterolemia, unspecified: Secondary | ICD-10-CM | POA: Diagnosis not present

## 2019-09-30 DIAGNOSIS — I700 Atherosclerosis of aorta: Secondary | ICD-10-CM | POA: Diagnosis not present

## 2019-09-30 DIAGNOSIS — Z1389 Encounter for screening for other disorder: Secondary | ICD-10-CM | POA: Diagnosis not present

## 2019-10-10 DIAGNOSIS — R69 Illness, unspecified: Secondary | ICD-10-CM | POA: Diagnosis not present

## 2019-10-13 DIAGNOSIS — J849 Interstitial pulmonary disease, unspecified: Secondary | ICD-10-CM | POA: Diagnosis not present

## 2019-10-15 NOTE — Telephone Encounter (Signed)
Dr [name],  This message was received for you this morning.   I seem to be tolerating the 100 mg of Ofev 2  times a day.   I would like to try one more month before making a decision.   Dr. Alfonso Patten. gave me a sample of 100 mg tablets.   Does he have another one ?   I will be happy to come get it. Does Dr. Alfonso Patten recommend that I get the  COVID booster?   Now or later?    Thank you.   [facility] routed to Dr [name]

## 2019-10-16 NOTE — Telephone Encounter (Signed)
Called and spoke with pt letting her know that MR did have another bottle of 162m OFEV that I was going to place up front for her and she verbalized understanding. Pt asked me if she should get the booster vaccine. Asked MR and he stated that she should get the booster due to her being on chronic prednisone. I have sent this info to pt in a mychart message. Nothing further needed.

## 2019-10-16 NOTE — Telephone Encounter (Signed)
Emily  Left 1 bottle of 30d worth of 143m Ofev on my main desk for pick up. Pls let patient know. That is last of the 1046mI have  THanks    SIGNATURE    Dr. MuBrand MalesM.D., F.C.C.P,  Pulmonary and Critical Care Medicine Staff Physician, Co[place]iggstownirector - Interstitial Lung Disease  Program  Pulmonary [id number]: [phone] no answer  OR between  19:00-7:00h: page [phone] (clinical office): [phone] (research): [phone]  2:37 PM 10/16/2019

## 2019-10-17 NOTE — Telephone Encounter (Signed)
   To Whom It May Concern  [name] 1934/06/21 has pulmonary fibrosis and is immunosuppressed.  Therefore she will benefit from a Covid booster vaccine.  She got the [employer] vaccine.  Therefore the booster should ideally be Pfizer  Please do not hesitate to reach me if you have any questions  Vaccination history as below.  Her allergy history is as below  Sincerely yours     SIGNATURE    Dr. Brand Males, M.D., F.C.C.P,  Pulmonary and Critical Care Medicine Staff Physician, [place] Director - Interstitial Lung Disease  Program  Pulmonary [place] [facility] [place], [place], [address]  Pager: [phone], If no answer  OR between  19:00-7:00h: page [phone] Telephone (clinical office): [phone] Telephone (research): [phone]  4:26 PM 10/17/2019  xxxxxxxxxxxxxxxxxxxxxxxxx  Immunization History  Administered Date(s) Administered  . Influenza, High Dose Seasonal PF 10/29/2016  . Influenza,inj,Quad PF,6+ Mos 11/05/2014  . Influenza-Unspecified 11/28/2013, 11/05/2014, 11/30/2015, 11/15/2018  . PFIZER SARS-COV-2 Vaccination 03/11/2019, 03/30/2019  . Pneumococcal Polysaccharide-23 02/10/2018  . Tdap 08/22/2016  . Zoster Recombinat (Shingrix) 09/22/2016, 12/22/2016   Allergies  Allergen Reactions  . Prednisone     Hallucination when given in high doses

## 2019-10-17 NOTE — Telephone Encounter (Signed)
Dr. [name] please advise on patient mychart message  Would you please have Dr. Alfonso Patten. or the PA write  a note that I have a pulmonary condition and I should have the booster.   It might make it easier to get a booster.  I will pick up the note when I come to get the Ofev.   I [name] to be by your office late Thursday morning .   Thanks.     [facility]

## 2019-10-18 NOTE — Telephone Encounter (Signed)
MR please advise on patient email:  Thank you.   Question....should I wait until 8 months after my last [place] which was 03/30/19 to get the booster?

## 2019-10-20 DIAGNOSIS — J849 Interstitial pulmonary disease, unspecified: Secondary | ICD-10-CM | POA: Diagnosis not present

## 2019-10-20 NOTE — Telephone Encounter (Signed)
Get it anytime now thjrough next few to several weeks as long as feeling ok

## 2019-10-21 ENCOUNTER — Other Ambulatory Visit: Payer: Self-pay | Admitting: Internal Medicine

## 2019-10-21 DIAGNOSIS — Z1231 Encounter for screening mammogram for malignant neoplasm of breast: Secondary | ICD-10-CM

## 2019-11-13 DIAGNOSIS — J849 Interstitial pulmonary disease, unspecified: Secondary | ICD-10-CM | POA: Diagnosis not present

## 2019-11-18 ENCOUNTER — Telehealth: Payer: Self-pay | Admitting: Internal Medicine

## 2019-11-18 DIAGNOSIS — J849 Interstitial pulmonary disease, unspecified: Secondary | ICD-10-CM

## 2019-11-18 NOTE — Telephone Encounter (Signed)
Patient called back and states that she had a CT scan in October 2020 and would like to have one done every year if possible. She states she has an appointment on 10/28 with Dr. [name] and would like to have scan done before her appointment if possible. Patient also states she was taking 150 mg and can not tolerate it and has gone down to 100 mg BID and needs refill. States that we would have to call [facility] and fax them the information so they can send it to her.  Dr. [name] please advise

## 2019-11-18 NOTE — Telephone Encounter (Signed)
ATC patient husband stated that she was not home at that time. Asked him to have her call the office at her convenience. He expressed understanding

## 2019-11-19 MED ORDER — OFEV 100 MG PO CAPS: 100.0000 mg | ORAL_CAPSULE | Freq: Two times a day (BID) | ORAL | 11 refills | Status: DC

## 2019-11-19 NOTE — Telephone Encounter (Signed)
Do HRCT supine and prone for ILD - in OCt 2021 some 1 week before she sees me  Ok to do ofev 159m twice daily - she had donor samples. Now we need prescription. Please proceed

## 2019-11-19 NOTE — Telephone Encounter (Signed)
Called and spoke with pt letting her know the info stated by MR that we were going to order HRCT to be performed about 1 week prior to her appt with MR. Also stated to her that we were going to call pharmacy to have them change her OFEV dose from 157m to 1088mand she verbalized understanding.  Called [facility] and spoke with Rolina at PhKernssociated with BIHenry ScheinProvided her a verbal of pt's OFEV Rx change. Rx has been changed in pt's chart and HRCT order has been placed. Nothing further needed.

## 2019-11-20 DIAGNOSIS — J849 Interstitial pulmonary disease, unspecified: Secondary | ICD-10-CM | POA: Diagnosis not present

## 2019-11-25 ENCOUNTER — Other Ambulatory Visit: Payer: Self-pay

## 2019-11-25 ENCOUNTER — Ambulatory Visit
Admission: RE | Admit: 2019-11-25 | Discharge: 2019-11-25 | Disposition: A | Discharge status: Home / Self Care | Payer: Medicare HMO | Source: Ambulatory Visit | Attending: Internal Medicine | Admitting: Internal Medicine

## 2019-11-25 DIAGNOSIS — Z1231 Encounter for screening mammogram for malignant neoplasm of breast: Secondary | ICD-10-CM

## 2019-11-26 DIAGNOSIS — R69 Illness, unspecified: Secondary | ICD-10-CM | POA: Diagnosis not present

## 2019-12-13 DIAGNOSIS — J849 Interstitial pulmonary disease, unspecified: Secondary | ICD-10-CM | POA: Diagnosis not present

## 2019-12-16 ENCOUNTER — Ambulatory Visit (INDEPENDENT_AMBULATORY_CARE_PROVIDER_SITE_OTHER): Payer: Medicare HMO | Admitting: Internal Medicine

## 2019-12-16 ENCOUNTER — Other Ambulatory Visit: Payer: Self-pay

## 2019-12-16 DIAGNOSIS — J849 Interstitial pulmonary disease, unspecified: Secondary | ICD-10-CM | POA: Diagnosis not present

## 2019-12-16 LAB — PULMONARY FUNCTION TEST
DL/VA % pred: 70 %
DL/VA: 2.89 ml/min/mmHg/L
DLCO cor % pred: 40 %
DLCO cor: 7.33 ml/min/mmHg
DLCO unc % pred: 40 %
DLCO unc: 7.33 ml/min/mmHg
FEF 25-75 Pre: 2.76 L/sec
FEF2575-%Pred-Pre: 227 %
FEV1-%Pred-Pre: 73 %
FEV1-Pre: 1.3 L
FEV1FVC-%Pred-Pre: 121 %
FEV6-%Pred-Pre: 64 %
FEV6-Pre: 1.45 L
FEV6FVC-%Pred-Pre: 106 %
FVC-%Pred-Pre: 60 %
FVC-Pre: 1.45 L
Pre FEV1/FVC ratio: 89 %
Pre FEV6/FVC Ratio: 100 %

## 2019-12-16 NOTE — Progress Notes (Signed)
Spirometry and Dlco done today.

## 2019-12-19 ENCOUNTER — Other Ambulatory Visit: Payer: Self-pay

## 2019-12-19 ENCOUNTER — Ambulatory Visit (INDEPENDENT_AMBULATORY_CARE_PROVIDER_SITE_OTHER)
Admission: RE | Admit: 2019-12-19 | Discharge: 2019-12-19 | Disposition: A | Discharge status: Home / Self Care | Payer: Medicare HMO | Source: Ambulatory Visit | Attending: Internal Medicine | Admitting: Internal Medicine

## 2019-12-19 DIAGNOSIS — J984 Other disorders of lung: Secondary | ICD-10-CM | POA: Diagnosis not present

## 2019-12-19 DIAGNOSIS — J849 Interstitial pulmonary disease, unspecified: Secondary | ICD-10-CM

## 2019-12-20 DIAGNOSIS — L438 Other lichen planus: Secondary | ICD-10-CM | POA: Diagnosis not present

## 2019-12-20 DIAGNOSIS — Z85828 Personal history of other malignant neoplasm of skin: Secondary | ICD-10-CM | POA: Diagnosis not present

## 2019-12-20 DIAGNOSIS — L820 Inflamed seborrheic keratosis: Secondary | ICD-10-CM | POA: Diagnosis not present

## 2019-12-20 DIAGNOSIS — L821 Other seborrheic keratosis: Secondary | ICD-10-CM | POA: Diagnosis not present

## 2019-12-20 DIAGNOSIS — D692 Other nonthrombocytopenic purpura: Secondary | ICD-10-CM | POA: Diagnosis not present

## 2019-12-20 DIAGNOSIS — D1801 Hemangioma of skin and subcutaneous tissue: Secondary | ICD-10-CM | POA: Diagnosis not present

## 2019-12-20 DIAGNOSIS — L570 Actinic keratosis: Secondary | ICD-10-CM | POA: Diagnosis not present

## 2019-12-20 DIAGNOSIS — J849 Interstitial pulmonary disease, unspecified: Secondary | ICD-10-CM | POA: Diagnosis not present

## 2019-12-24 NOTE — Progress Notes (Signed)
Progressive ILD. Will discuss 12/26/19 office visit. Will NOT call with results

## 2019-12-26 ENCOUNTER — Other Ambulatory Visit: Payer: Self-pay

## 2019-12-26 ENCOUNTER — Ambulatory Visit: Payer: Medicare HMO | Admitting: Internal Medicine

## 2019-12-26 ENCOUNTER — Encounter: Payer: Self-pay | Admitting: Internal Medicine

## 2019-12-26 VITALS — BP 120/80 | HR 67 | Temp 97.3°F | Ht 64.0 in | Wt 126.2 lb

## 2019-12-26 DIAGNOSIS — Z5181 Encounter for therapeutic drug level monitoring: Secondary | ICD-10-CM

## 2019-12-26 DIAGNOSIS — J849 Interstitial pulmonary disease, unspecified: Secondary | ICD-10-CM

## 2019-12-26 DIAGNOSIS — I359 Nonrheumatic aortic valve disorder, unspecified: Secondary | ICD-10-CM | POA: Diagnosis not present

## 2019-12-26 DIAGNOSIS — I2510 Atherosclerotic heart disease of native coronary artery without angina pectoris: Secondary | ICD-10-CM | POA: Diagnosis not present

## 2019-12-26 DIAGNOSIS — J84112 Idiopathic pulmonary fibrosis: Secondary | ICD-10-CM | POA: Diagnosis not present

## 2019-12-26 DIAGNOSIS — J9611 Chronic respiratory failure with hypoxia: Secondary | ICD-10-CM | POA: Diagnosis not present

## 2019-12-26 DIAGNOSIS — R600 Localized edema: Secondary | ICD-10-CM | POA: Diagnosis not present

## 2019-12-26 LAB — HEPATIC FUNCTION PANEL
ALT: 24 U/L (ref 0–35)
AST: 24 U/L (ref 0–37)
Albumin: 4.2 g/dL (ref 3.5–5.2)
Alkaline Phosphatase: 66 U/L (ref 39–117)
Bilirubin, Direct: 0.2 mg/dL (ref 0.0–0.3)
Total Bilirubin: 0.7 mg/dL (ref 0.2–1.2)
Total Protein: 7 g/dL (ref 6.0–8.3)

## 2019-12-26 LAB — BASIC METABOLIC PANEL
BUN: 20 mg/dL (ref 6–23)
CO2: 29 mEq/L (ref 19–32)
Calcium: 9.9 mg/dL (ref 8.4–10.5)
Chloride: 100 mEq/L (ref 96–112)
Creatinine, Ser: 0.98 mg/dL (ref 0.40–1.20)
GFR: 53.36 mL/min — ABNORMAL LOW (ref >60.00)
Glucose, Bld: 100 mg/dL — ABNORMAL HIGH (ref 70–99)
Potassium: 4.5 mEq/L (ref 3.5–5.1)
Sodium: 137 mEq/L (ref 135–145)

## 2019-12-26 MED ORDER — FUROSEMIDE 20 MG PO TABS: 20.0000 mg | ORAL_TABLET | Freq: Every day | ORAL | 5 refills | Status: DC

## 2019-12-26 MED ORDER — PREDNISONE 5 MG PO TABS: ORAL_TABLET | ORAL | 3 refills | Status: DC

## 2019-12-26 MED ORDER — POTASSIUM CHLORIDE ER 10 MEQ PO TBCR: 10.0000 meq | EXTENDED_RELEASE_TABLET | Freq: Every day | ORAL | 5 refills | Status: DC

## 2019-12-26 NOTE — Patient Instructions (Addendum)
ICD-10-CM   1. Chronic respiratory failure with hypoxia (HCC)  J96.11   2. ILD (interstitial lung disease) ([place])  J84.9   3. IPF (idiopathic pulmonary fibrosis) ([place])  J84.112   4. Pedal edema  R60.0   5. Aortic valve calcification  I35.9   6. Coronary artery calcification seen on CAT scan  I25.10     Chronic respiratory failure with hypoxia ([place])  -Oxygen need is because of pulmonary fibrosis.  Currently noted that you are using oxygen 75% of the time.  Understood that even at 4-5 L of portable oxygen you desaturate when you walk in your driveway  Plan -Continue 4-5 L of oxygen at night and 4-5 L of oxygen with exertion  -We decided not to get heavier system because of how cumbersome at this  -We will continue to monitor your oxygen levels   ILD (interstitial lung disease) ([place]) IPF (idiopathic pulmonary fibrosis) ([place])  -Our radiologist feels strongly that the pulmonary fibrosis pattern that you have is more consistent with IPF as opposed to hypersensitive pneumonitis  -Glad you are tolerating nintedanib 100 mg twice daily well  -Still the disease is progressive with time  Plan -Continue nintedanib 100 mg twice daily -Okay to make an attempt at tapering prednisone  -Start with 10 mg of prednisone a day on Monday Wednesday Friday while attempting 5 mg of prednisone a day on Tuesday Thursday Saturday and Sunday  -If you are tolerating the schedule above quite well for a month then he can try to reduce to 5 mg once daily  -In the future we can consider clinical trials as a care option looking at inhaled nitric oxide therapy to improve physical activity or inhaled prostaglandins such as treprostinil.  We will have more information on this in future visits to introduce these concepts to you  Pedal edema  -You have chronic venous stasis edema secondary to the pulmonary fibrosis, venous incompetency and chronic prednisone.  Too bad compression stockings are causing significant  pain.  Plan -Start Lasix 20 mg once daily with potassium chloride 10 mEq once daily  Therapeutic drug monitoring  Plan -Check liver function test and chemistry panel today [this is for nintedanib and also for Lasix start]  Aortic valve calcification Coronary artery calcification seen on CAT scan  -There is a new finding seen on CT scan of the chest.  Noted that you have not had a cardiac stress test before and a previous echo was over a year ago at [facility] 2D echocardiogram in the next few weeks -Refer to either Dr. Haroldine Laws or Dr. Aundra Dubin or Dr. Angelena Form or Dr. Ellyn Hack or Dr. Debara Pickett in the next few to several weeks  -Based on the results of the echocardiogram and his symptoms they might or might not recommend a stress test and/or a heart catheterization  Follow-up -8-10 weeks and a 30-minute slot with Dr. [name] to regroup

## 2019-12-26 NOTE — Progress Notes (Signed)
Blood work normal. Will not be calling with these results

## 2019-12-26 NOTE — Progress Notes (Signed)
IOV 03/21/2017  Chief Complaint  Patient presents with  . Advice Only    Former RA pt. Pt states she has been going to [facility] and is on 3-4L O2 with exertion and sometimes at night. Has SOB mainly with exertion.    [name] 85 y.o. female with interstitial lung disease.  History is gained from talking to her and review of the chart.  She was originally diagnosed with interstitial lung disease by Dr. Kara Mead in our practice.  Subsequently she transferred her care to [facility] Dr. [name] who has been following her.  Review of the chart shows the differential diagnosis is IPF versus chronic hypersensitivity pneumonitis based on some air trapping done in her high-resolution CT scan of the chest in 2017/2018.  She is known to have chronic stable interstitial lung disease on previous imaging dating as back as 2008 but she is only aware of her interstitial lung disease in the recent few years.  There is excellent documentation in Dr. [name] medical notes.  It appears based on the goals of care of the patient and her exertional desaturation that surgical lung biopsy was deferred and anti-fibrotic therapy was deferred.  Patient confirms the same.  She is extremely physically fit and thus good aerobic activity on 4 L of oxygen outpacing people much younger than her.  It appears over many years has been a gradual decline in her fibrosis and pulmonary health but she still extremely functional and does well.  She is mainly here today to have a pulmonary fibrosis resource in [place], [place].  She sees Dr. Lauris Chroman every 4 months.  She prefers to see Dr. Lauris Chroman and do all her monitoring in terms of her 6-minute walk test, pulmonary function test and her high-resolution CT scan at [facility] because she is very comfortable with the personnel there.  Yet because she is local in [place] she wants to tap into the resources here in case of an emergency.  We discussed  about alternating her follow-up between myself and Dr. Lauris Chroman every 4 months which would make her visit to either one place every 8 months and she seemed to think this was a good plan.  At this Conway as she is stable and she is satisfied with all pillars of our care.  She is not interested in pulmonary rehabilitation because rightfully so she exercises pretty actively.  She has been the patient support group and did not find value in it.  She is not a transplant candidate.  And she is deferred anti-fibrotic therapy.  She might or might not be interested in research trial and this was not discussed.  It appears the only new issue other than her overall stability is that for the last 1 year she has mild random dysphagia although no active acid reflux.  This can happen for liquids or solids or tablets and is very random.  She has not seen a gastroenterologist for this.  She is not on any proton pump inhibitors.  In addition she tells me she uses of feather down pillow all her life.  She is also use talcum powder all her life and with the recent lawsuit against [employer] about talcum powder she wonders if this could be contributory to her pulmonary fibrosis.  Otherwise [employer] of chest physicians interstitial lung disease questionnaire is positive for cigarette smoking from age 71 when she smoked a pack a week to age 42.  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  She did recently try a course of prednisone through Dr. Lauris Chroman for her interstitial lung disease but she did not seem to benefit from this and this is been slowly weaned off.  Her autoimmune antibody at [facility] and in [place] was normal.  Pulmonary function test from [facility] was reviewed and the images of CT scan of the chest done in [place] visualized  Walking desaturation test on 03/21/2017 185 feet x 3 laps on ROOM AIR:  did not desaturate. Rest pulse ox was 100%, final pulse ox was 88%. HR response  71/min at rest to 120/min at peak exertion. Patient [name]  Did yes Desaturate </= 88% . [name] yes  Desaturated </= 3% points. [name] yes did get tachyardic  IMPRESSION: 1. Pulmonary parenchymal pattern of fibrosis is progressive, most indicative of usual interstitial pneumonitis (UIP). 2. Coronary artery calcification.   Electronically Signed   By: Lorin Picket M.D.   On: 10/27/2014 15:51 OV 06/21/2017   Chief Complaint  Patient presents with  . Follow-up    Pt saw Dr. Lauris Chroman 2/4.  Pt states she has been doing well up until 10 days ago when she developed a cold and is coughing, postnasal drainage and some mild chest tightness.   [name] presents for follow-up of interstitial lung disease [IPF most likely versus chronic HP less likely],. Is supportive care. She follows mostly at [facility] with Dr. [name]. I am her local resource. This is a scheduled appointment. Last seen by Dr. Lauris Chroman in February 2019. Notes reviewed and patient also confirms the same. It appears that without low-dose prednisone her cough relapsed. Therefore she went back on 5 mg prednisone and she was doing well in terms of her cough. However 10 days ago she developed a runny nose and cold and since then has increased cough compared to baseline. She also has a nasal twang. This a slight increase in sputum production compared to baseline and the sputum is off color. This is not normal for her. The might be some increased wheezing but his sleep quality is good. Overall effort tolerance continues to be good. She exercises with oxygen. At room air at rest she does not need oxygen. Most recently in February 2019 her ILD was deemed stable by Dr. Lauris Chroman. [place] the [place] in [place] few days she wants treatment recommendation for the current deterioration. She does not want to do high dose prednisone because of fear of hallucinationsthat have happened in the past. She also now  wants to alternate pulmonary fibrosis follow-up between [facility] and myself.   She also last several months c/o loss of taste and asking for directions in resolving this issue      OV 01/23/2018  Subjective:  Patient ID: [name], female , DOB: Sep 09, 1934 , age 89 y.o. , MRN: [id number] , ADDRESS: [place] [id number]   01/23/2018 -   Chief Complaint  Patient presents with  . Follow-up    States her breathing has been at her baseline. Denies chest pain or chest discomfort. States tesslon helps but it is too expensive.      HPI [facility] 85 [facility]. -returns for follow-up of her interstitial lung disease that has a differential diagnosis between IPF and chronic hypersensitivity pneumonitis.  After seeing me last she saw Dr. Lauris Chroman in August 2019.  Since then she has been doing  stable.  I reviewed those notes.  Compared to recent visits her CT scan is stable and her walk test is stable.  She is able to manage with 2 L oxygen.  She has a portable system.  She does feel that she is unable to do more than she would like to do because of her ILD.  She does admit that compared to 3 years ago she says progressive ILD.  She also has significant cough for which she likes Ladona Ridgel but she says insurance is making him pay $3 a pill and so she is upset about that.  Review of immunization record shows that she has not had Pneumovax or flu shot this year but she says she is up-to-date.  We discussed the new [employer] trial for progressive non-IPF ILD where there was beneficial effect of nintedanib.  We discussed this and she is willing to try this after the new year 2020.  She wants to make sure that her [place] ILD specialist Dr. Lauris Chroman is on board with this.  We discussed the side effects in detail.    OV 03/08/2018  Subjective:  Patient ID: [name], female , DOB: [birth year] , age 43 y.o. , MRN: [id number] , ADDRESS: [place]  [id number]   03/08/2018 -   Chief Complaint  Patient presents with  . Follow-up    Pt states she has been well since last visit. States she does become SOB with exertion and does have an occ cough with clear mucus. Denies any CP.    ilD FOLLOWUP   HPI [name] 85 y.o. -returns for followup. Over xmas 2019 because of social stress increased prednisone to 6m per day but now back to 567mper day. Dyspnea with exergtion continues but stable. Cough that is very bothersome continue.  She takes TeBest boyor this this helps her.  However it is expensive.  She is placed a call to [employer] apparently they are going to try to work out where she can make it more affordable because I ILD is a diagnosis.  She admitted that she has down pillows and feather blankets.  1 of her close friends Mr. TaLovena Leho also has hypersensitivity pneumonitis and sees me advised her to get rid of those and therefore she is in the process of doing that.  She asked about further options and treatment of a cough.  We discussed gabapentin no increasing steroid dose participating in a cough study but she did not want to add these complications to her.  She seems content with the option of trying an inspiratory muscle trainer and seeing if the cough would get better.  Overall given her progressive ILD and the possibility that this is IPF she is willing to now try nintedanib.  We discussed nintedanib extensively.  She denies any heart disease.  She is not on any major anticoagulation.  She does not have any GI issues other than constipation.  She is willing to try nintedanib.  She is nervous about the co-pay and said if the co-pay is too expensive despite charity program then she will not take it.  She [facility].          OV 01/17/2019  Subjective:  Patient ID: Terry Lorenzofemale , DOB: 4/11-02-1934 age 85.o. , MRN: [id number] [address] [address] [id number]  01/17/2019 -   Chief Complaint  Patient presents with  . Follow-up    Pt states she has been okay since last visit. Pt states SOB has become worse and is coughing a lot.   Interstitial lung disease-IPF [UIP in August 2016 CT scan read locally] versus second distant possibility chronic hypersensitivity pneumonitis [feather pillow exposure and air trapping on CT scan per report]   HPI [name] 85 y.o. -last seen January 2020.  Since then in the last 6 months he has declined significantly.  She did see Dr. Lauris Chroman about a month ago at [facility].  I reviewed the chart.  Her shortness of breath with exertion has declined.  With exercise and walking she is using 4 L of oxygen.  Her cough also got worse.  He has increased the prednisone to 10 mg/day.  This is helped her cough.  Nevertheless she randomly gets severe coughing spells that makes her feel that she is going to die.  She is got a facemask oxygen is going to use that.  There was conversation about increasing the prednisone even further according to her history but she declined.  At this Conway in time she just wants to monitor the situation.  She certainly is open to taking more prednisone if the cough got worse.  She did have a high-resolution CT scan of the chest December 10, 2018 at [facility].  I reviewed the result.  Images not available for visualization.  They report definite persistent air trapping.  They also report some nodular opacities in the left lung.  Overall worsening fibrosis especially on the left greater than the right.  She did have an echocardiogram.  Her symptom scores currently are listed below and it shows significant level of symptoms.  We discussed antifibrotic nintedanib.  She says she is so frustrated because of the cost.  She says she tried a level best to get the cost down but could not.  She is pretty pessimistic that she will not be able to get a discount on the drug.   We discussed about the fact her disease continues to be progressive and to see if the cost would justify some of the benefit it might give.  She is willing to have me inquire about the cost.  We discussed research as a care option.  We discussed in detail the concept of research being voluntary.  The principles of randomization to placebo.  The need to do study visits.  Inclusion exclusion criteria.  She is willing to go through screening for any protocol for which she might qualify.  This will be possible after January 2020.  She had questions about the Covid vaccine.  She is social distancing.  She is afraid of getting Covid.     OV 04/22/2019  Subjective:  Patient ID: [name], female , DOB: 02/26/1935 , age 76 y.o. , MRN: [id number] , ADDRESS: [place] [id number]   04/22/2019 -   Chief Complaint  Patient presents with  . Follow-up    Pt states she has been doing well since last visit and states her breathing is stable.   Interstitial lung disease ( Dr Lauris Chroman and Dr [name])  -IPF [UIP in August 2016 CT scan read locally] versus   - chronic hypersensitivity pneumonitis [feather pillow exposure and air trapping on CT scan per report]  - started steroids Oct 2017 - [facility]is Chroman  - Dickey Gave Jan 2021  - air purifier  since jan 2021   HPI [name] 85 y.o. -presents with her husband.  She says overall she is doing well.  She is now taking the nintedanib for the last 3 weeks.  She is entering the fourth week of the nintedanib.  She says since she started nintedanib cough is improved all the dyspnea is the same.  She now has air purifier at home.  Review of her symptom score shows improvement in her symptoms.  In terms of her nintedanib tolerance she is tolerating it fine.  She says so far no side effects.  Liver function test March 27, 2019 is normal.  She is interested in research protocols.  On this visit I was able to review her [place] records.  She last saw  Dr. Lauris Chroman in October 2020.  Dr. Lauris Chroman was concerned about her progression.  She did have a high-resolution CT chest that I cannot visualize the image myself because it was done at [facility] the report there is one that is more consistent with an alternative diagnosis to UIP and more consistent with chronic hypersensitivity pneumonitis.  As always a debate between IPF versus chronic hypersensitive pneumonitis in Ms. Blaker with the weight of the clinical evidence being more towards IPF per [facility] records.  I reviewed our records and so far we have never discussed in our multidisciplinary case conference.  Her walking desaturation test clearly shows deterioration compared to 1 year ago  She also told me a little bit of blurring of vision on and off with prednisone.  She wanted to know safe prednisone dose.  She feels 5 mg of prednisone does not control her symptoms well but 10 mg dose.  Her main new issue - new issue venous stasis RLE.  She has a nonhealing wound ulcer that is over an inch in size.  She is wrapped it with TED stockings and Ace wrap.  I am not able to see the wound.  She sees the wound care here.  She keeps the leg elevated intermittently during the daytime per their advice.  She is frustrated by this.   HRCT oct 2020 [facility]  Impression: 1. Slightly increased left greater than right lung pulmonary fibrosis favored to be due to chronic hypersensitivity pneumonitis. Increased superimposed nodular opacities in the left lung are favored to be related to worsening fibrosis with superimposed infection/malignancy not excluded, attention on follow-up.  Electronically Reviewed by:  Verdell Carmine, MD, [facility] Electronically Reviewed on:  12/10/2018 12:00 PM  I have reviewed the images and concur with the above findings.  Electronically Signed by:  Tessa Lerner, MDPhD,[facility] Electronically Signed on:  12/10/2018 5:39 PM   [facility]cho [facility] oct 2020 INTERPRETATION  --------------------------------------------------------------- NORMAL LEFT VENTRICULAR SYSTOLIC FUNCTION WITH MILD LVH NORMAL RIGHT VENTRICULAR SYSTOLIC FUNCTION VALVULAR REGURGITATION: TRIVIAL MR, TRIVIAL PR, MILD TR NO VALVULAR STENOSIS MILD TO MODERATE TR NO PRIOR STUDY FOR COMPARISON   OV 08/02/2019 - telephone visit. Identified with 2PHI. Called patient on her cell phone 11:58 AM   Subjective:  Patient ID: [name], female , DOB: 02-19-1935 , age 44 y.o. , MRN: [id number] , ADDRESS: [place] [id number]  Interstitial lung disease ( Dr Lauris Chroman and Dr [name])  -IPF [UIP in August 2016 CT scan read locally - [facility] - [facility] read in 2011 and 2016 the odds of the scan being more consistent with hypersensitive pneumonitis is low.  The odds are  this is UIP/IPF.  However October 2020 scans from [facility] would be most helpful.  Regardless there is progression and it is appropriate patients on antifibrotic therapy as she is right now] versus   - chronic hypersensitivity pneumonitis [feather pillow exposure and air trapping on CT scan per report]  - started steroids Oct 2017 - [facility] Dr Lauris Chroman  - Dickey Gave Jan 2021 - stopped full dose may 2021  - air purifier since jan 2021   08/02/2019 -     HPI [name] 85 y.o. - says she had a  Wound on right lower extremity following a bump in dec 2020. Did not heal. [name] to wound center for 3 months. Discharged a week ago from followup., DUring that time her RLE was wrapped in compression bandages. But now she is not on that. And since then ankles are swollen. Now she is on "Suppose" which helps. But as she removes it - > leg swelling gets worse. Swelling is bilateral but RLE > LLE.  Wnts to know iof ILD is causing this. Answered that is possible via Cable mechanism. Reviewed echo from oct 2020- > this did not show RV strain. Currently per her hx - does not sound like cellulitis or DVT (all  chronic and unchanged). Nto currently interested in lasix.   Overall, sicne jan 2021 says ILD is worse and using more o2. Ngiht usingi 4L [place] at night and rest. Sometimes needs 5L [place] with exertion. When she goes to groceries - > takes portable o2 concentrator  Now on ofev 150 mg tablet - taking it once a day since 07/14/19. She is not sur if she can take bid / once daily alternate day as yet. Wants this lower regime for 1 month and then try esclating  Also down to 54m prednisone Continue on tessalon perles  Doing ok on it  Oct 2020> [facility] is not uploaded in our PACT    ROS  -  OV 09/23/2019   Subjective:  Patient ID: [name] female , DOB: [birth year] age 85y.o. years. , MRN: [id number]  ADDRESS: [address] [address]  GLavone Orn MD Providers : Treatment Team:  Attending Provider: RBrand Males MD   Interstitial lung disease ( Dr GLauris Chromanand Dr RChase Caller  -IPF [UIP in August 2016 CT scan read locally - [facility] the odds of the scan being more consistent with hypersensitive pneumonitis is low.  The odds are this is UIP/IPF.  [facility]  Regardless there is progression and it is appropriate patients on antifibrotic therapy as she is right now] versus   - chronic hypersensitivity pneumonitis [feather pillow exposure and air trapping on CT scan per report]  - started steroids Oct 2017 - [facility] Dr GLauris Chroman - oDickey GaveJan 2021 - stopped full dose may 2021 -> since then 1532monce daily  - air purifier since jan 2021     09/23/2019 -returns for follow-up.  At this visit she tells me that her last [facility].  He is now no longer seeing ILD patients or any patients and therefore she would just will keep a follow-up with me.  She is taking prednisone 10  mg/day although splitting it is 5 mg twice daily.  She says this gives her more energy.  She says she is using 4-5 L at rest and continuously although today when we walked at room air at rest she was fine and she only dropped to 88% after walking 2 laps and went down to 84% after walking 3 laps.  The distance is shown below.  She wants to qualify for at least 10 L oxygen.  I have advised her she may need to come for oxygen titration test.  In terms of her nintedanib she is taking 150 mg once daily.  She tried to go to an average of 200 mg total for the day by taking 150 mg twice daily Monday Wednesday Friday and then taking 150 mg once daily on Tuesday Thursday and Saturday and rotating between the 2 on Sunday.  However she says discussed lot of diarrhea and she stopped it.  Her weight appears stable.  She wants to see if she can escalate to a slightly higher dose on the nintedanib because I have advised her that 150 mg once daily is subtherapeutic.  She does have some foot numbness problems.  I have suggested she talk this with the primary care physician.  Her symptom score and PFT shows a steady progressive decline in her ILD.  Her symptom score shows continued worsening.    OV 12/26/2019   Subjective:  Patient ID: [name], female , DOB: 28-Nov-1934, age 39 y.o. years. , MRN: [id number],  ADDRESS: [place] [address] PCP  [name], MD Providers : Treatment Team:  Attending Provider: Brand Males, MD Patient Care Team: [name], MD as PCP - General (Internal Medicine)    Interstitial lung disease ( Dr Lauris Chroman and Dr [name])  -IPF [UIP in August 2016 CT scan read locally - [facility] - [facility]: [facility] read in 2011 and 2016 the odds of the scan being more consistent with hypersensitive pneumonitis is low.  The odds are this is UIP/IPF.  However October 2020 scans from [facility] would be most helpful.   Regardless there is progression and it is appropriate patients on antifibrotic therapy as she is right now] versus   - chronic hypersensitivity pneumonitis [feather pillow exposure and air trapping on CT scan per report]  - started steroids Oct 2017 - [facility] Dr Lauris Chroman  - Dickey Gave Jan 2021 - stopped full dose may 2021 -> since then 136m once daily -> on 1033mtwice daily by Aug 2021  - air purifier since jan 2021  - [facility]D: UIP/IPF - no air trapping on Oct 2020 and Oct 2021 CT -> dx is IPF bsed on April 2021 conference and more formally infored to patient 12/26/19   Left diaphragm elevation-chronic and contributing to asymmetry of basal honeycombing  Chief Complaint  Patient presents with  . Follow-up    Pt states she has been doing okay since last visit. States that she is tolerating the OFEV so far. Pt does still have SOB but states her cough is better.       [facility] -returns for follow-up with her husband.  At this visit multiple issues to discuss.  At this Conway in time in terms of therapy she is on nintedanib.  She is able to do 100 mg twice daily since August 2021.  She is not having much side effects.  In terms of symptoms on the symptom score she  seems stable but she tells me that she is actually doing less and less she is not able to exercise anymore because of hypoxemia.  She uses oxygen 75% of the time.  She uses 4-5 L at night while sleeping and 4-5 L with portable system when exerting.  She walks of the driveway and she would desaturate to less than 88%.  She states she is restricted her exertion but she still does go around to the grocery store with the oxygen.  Her weight is stable on nintedanib at 126 pounds.  Today she will have liver function tests  In terms of her disease severity: She had pulmonary function test we reviewed this that shows steady progression over time.  She also had high-resolution CT chest that is definitely progressed compared to last  year.  Again our radiologist feels quite confidently that the pattern is definitely for UIP.  There is no suggestion of alternative diagnosis such as hypersensitive pneumonitis.  I went over this with the patient and said that we would formally classify her as IPF.  Because of this diagnosis we then discussed the prednisone intake which she understands was started for possibility of hypersensitive pneumonitis.  Explained to her that our radiologist are not seeing air trapping.  She does say that the prednisone does make her feel better and when she tapers that she could feel worse.  Now that she is willing to try to reduce the prednisone based on data that in IPF chronic prednisone could potentially be harmful but overall she says that she has tolerated prednisone quite well.  We went over the general side effects of prednisone including bone health.  She is worried about her chronic pedal edema.  This is getting worse.  Foot elevation is not helping.  She says compression stockings can be very painful for her.  In the past she was on diuretic therapy.  She is willing to restart this.  She does not understand the pathologic physiologic basis for this.  We went over pulmonary hypertension, venous stasis skin bruising aging and venous incompetency.  We discussed the possibility of modulating pulmonary artery pressures with the help of inhaled nitric oxide as a trial inhaled treprostinil as standard of care or as a trial.  This will be dependent on cardiac data.  I informed her that we will discuss this more in the future as we get more information  She is also worried about her recent CT findings that shows aortic valve calcifications and coronary artery calcifications.  She does not have chest pain but she says this description is new to her.  Her last echocardiogram was over a year ago at [facility].  I reviewed the records could not find it.  She does not have any chest pain she is not seen a cardiologist.   She never had a stress test.  She is willing to go through work-up for this.    SYMPTOM SCALE - ILD Nov 2020 04/22/2019 - ofev since jan 2021  09/23/2019 125# ofev 157m daily 12/26/2019 126# ofev 1029mtwice daily  O2 use 4L with ex Portable with exertion  4-5 L at night and 4-5L with exertion, uses o2 75% of the time, 95% RA at rest  Shortness of Breath  0 -> 5 scale with 5 being worst (score 6 If unable to do)    At rest 2 0 3 3  Simple tasks - showers, clothes change, eating, shaving _0 Household (dishes,  doing bed, laundry) 4 3._0 Shopping _1 Walking level at own pace _2 Walking up Stairs _3 Total (30-36) Dyspnea Score 19 11._4 How bad is your cough? 4 2.5 improved with ofev 2.5 better  How bad is your fatigue _5 ok  How bad is nausea  0 0 0  How bad is vomiting?   0 0 0  How bad is diarrhea?  0 2 0  How bad is anxiety?  1 0 0  How bad is depression  0 0 0        Results for [name], [name] (MRN [id number]) as of 01/23/2018 17:05  Ref. Range 10/28/2014 16:39 10/03/2017 [facility] 12/10/2018 [facility] 4[facility]4/21 [facility] 12/16/19  FVC-Pre Latest Units: L 2.19 1.85 1.64 1.58L 1.45  FVC-%Pred-Pre Latest Units: % 84 69% 66  60%   Results for [name], [name] (MRN [id number]) as of 01/23/2018 17:05  Ref. Range 10/28/2014 16:39 01/23/2018  12/10/2018 4.4/21 12/16/19 - GLI equation  DLCO unc Latest Units: ml/min/mmHg 12.16 8.36 5.68 6.6 7.33  DLCO unc % pred Latest Units: % 52 51% 30  40%    Simple office walk 250 feet x  3 laps goal with forehead probe 03/08/2018  04/22/2019  09/23/2019  12/26/2019   O2 used Room air Room air ra   Number laps completed 3 Only did 2 laps Did 2 laps and desaturated at end of 2nd lap but kept walking to 3.5laps   Comments about pace brisk Mod pace Mod pace   Resting Pulse Ox/HR 99% and 85/min 99% and 86/min 998% and HR 80/min   Final Pulse Ox/HR 90% and 124/min 87% and 119/min 88% and HR 112/min @ end of 2nd laps -> 84% at  3.5 laps without staopping / HR 116/min   Desaturated </= 88% no yes    Desaturated <= 3% points yes yues    Got Tachycardic >/= 90/min yes yes    Symptoms at end of test Moderate dyspnea Mod dyspnea Mo dyspnea   Miscellaneous comments x woprse       CLINICAL DATA:  85 year old female with history of interstitial lung disease. Worsening oxygenation and shortness of breath since February.  CT CHEST 12/19/19  EXAM: CT CHEST WITHOUT CONTRAST  TECHNIQUE: Multidetector CT imaging of the chest was performed following the standard protocol without intravenous contrast. High resolution imaging of the lungs, as well as inspiratory and expiratory imaging, was performed.  COMPARISON:  Chest CT 12/10/2018.  FINDINGS: Cardiovascular: Heart size is normal. There is no significant pericardial fluid, thickening or pericardial calcification. There is aortic atherosclerosis, as well as atherosclerosis of the great vessels of the mediastinum and the coronary arteries, including calcified atherosclerotic plaque in the left main, left anterior descending, left circumflex and right coronary arteries. Calcifications of the aortic valve.  Mediastinum/Nodes: No pathologically enlarged mediastinal or hilar lymph nodes. Please note that accurate exclusion of hilar adenopathy is limited on noncontrast CT scans. Esophagus is unremarkable in appearance. No axillary lymphadenopathy.  Lungs/Pleura: High-resolution images demonstrate widespread areas of septal thickening, subpleural reticulation, traction bronchiectasis, parenchymal banding and extensive honeycombing. Findings are asymmetric (left greater than right). In the right lung, there is a definitive craniocaudal gradient. Overall, findings are progressive compared to prior study from 2020. No acute consolidative airspace disease. No pleural effusions. No definite suspicious appearing pulmonary nodules or masses are noted.  Upper  Abdomen:  Elevation of the left hemidiaphragm. Aortic atherosclerosis.  Musculoskeletal: There are no aggressive appearing lytic or blastic lesions noted in the visualized portions of the skeleton.  IMPRESSION: 1. Progressive worsening interstitial lung disease, with a spectrum of findings considered diagnostic of usual interstitial pneumonia (UIP) per current ATS guidelines. The asymmetry of the findings (left greater than right) may be related to chronic left hemidiaphragm paralysis. 2. Aortic atherosclerosis, in addition to left main and 3 vessel coronary artery disease. 3. There are calcifications of the aortic valve. Echocardiographic correlation for evaluation of potential valvular dysfunction may be warranted if clinically indicated.  Aortic Atherosclerosis (ICD10-I70.0).   Electronically Signed   By: Vinnie Langton M.D.   On: 12/20/2019 06:58     has a past medical history of Hemorrhoids and IPF (idiopathic pulmonary fibrosis) ([place]).   reports that she quit smoking about 32 years ago. Her smoking use included cigarettes. She has a 7.50 pack-year smoking history. She has never used smokeless tobacco.  Past Surgical History:  Procedure Laterality Date  . BREAST EXCISIONAL BIOPSY Left    x 2  . TONSILLECTOMY     age 30    Allergies  Allergen Reactions  . Prednisone     Hallucination when given in high doses     Immunization History  Administered Date(s) Administered  . Influenza, High Dose Seasonal PF 10/29/2016, 12/12/2019  . Influenza,inj,Quad PF,6+ Mos 11/05/2014  . Influenza-Unspecified 11/28/2013, 11/05/2014, 11/30/2015, 11/15/2018  . PFIZER SARS-COV-2 Vaccination 03/11/2019, 03/30/2019  . Pneumococcal Polysaccharide-23 02/10/2018  . Tdap 08/22/2016  . Zoster Recombinat (Shingrix) 09/22/2016, 12/22/2016    Family History  Problem Relation Age of Onset  . Stroke Father   . Lung cancer Brother        mets     Current Outpatient  Medications:  .  aspirin 81 MG EC tablet, Take 81 mg by mouth daily. , Disp: , Rfl:  .  atorvastatin (LIPITOR) 20 MG tablet, Take 10 mg by mouth daily. , Disp: , Rfl:  .  benzonatate (TESSALON) 200 MG capsule, Take 1 capsule by mouth as needed., Disp: , Rfl:  .  calcium carbonate (OS-CAL) 600 MG TABS, Take 600 mg by mouth daily., Disp: , Rfl:  .  LINZESS 145 MCG CAPS capsule, , Disp: , Rfl:  .  Nintedanib (OFEV) 100 MG CAPS, Take 1 capsule (100 mg total) by mouth 2 (two) times daily., Disp: 60 capsule, Rfl: 11 .  predniSONE (DELTASONE) 10 MG tablet, Take 10 mg by mouth daily. , Disp: , Rfl:       Objective:   Vitals:   12/26/19 1113  BP: 120/80  Pulse: 67  Temp: (!) 97.3 F (36.3 C)  TempSrc: Other (Comment)  SpO2: 95%  Weight: 126 lb 3.2 oz (57.2 kg)  Height: 5' 4" (1.626 m)    Estimated body mass index is 21.66 kg/m as calculated from the following:   Height as of this encounter: 5' 4" (1.626 m).   Weight as of this encounter: 126 lb 3.2 oz (57.2 kg).  _0 @  Filed Weights   12/26/19 1113  Weight: 126 lb 3.2 oz (57.2 kg)     Physical Exam  General: No distress. Looks well Neuro: Alert and Oriented x 3. GCS 15. Speech normal Psych: Pleasant Resp:  Barrel Chest - no.  Wheeze - no, Crackles - mild at base wieth velcro crackle, No overt respiratory distress but does get labored talking CVS: Normal heart sounds. Murmurs - no Ext: Stigmata of  Connective Tissue Disease - no HEENT: Normal upper airway. PEERL +. No post nasal drip        Assessment:       ICD-10-CM   1. Chronic respiratory failure with hypoxia (HCC)  J96.11   2. ILD (interstitial lung disease) ([place])  J84.9   3. IPF (idiopathic pulmonary fibrosis) ([place])  J84.112   4. Therapeutic drug monitoring  Z51.81   5. Pedal edema  R60.0   6. Aortic valve calcification  I35.9   7. Coronary artery calcification seen on CAT scan  I25.10        Plan:     Patient Instructions     ICD-10-CM   1.  Chronic respiratory failure with hypoxia (HCC)  J96.11   2. ILD (interstitial lung disease) ([place])  J84.9   3. IPF (idiopathic pulmonary fibrosis) ([place])  J84.112   4. Pedal edema  R60.0   5. Aortic valve calcification  I35.9   6. Coronary artery calcification seen on CAT scan  I25.10     Chronic respiratory failure with hypoxia ([place])  -Oxygen need is because of pulmonary fibrosis.  Currently noted that you are using oxygen 75% of the time.  Understood that even at 4-5 L of portable oxygen you desaturate when you walk in your driveway  Plan -Continue 4-5 L of oxygen at night and 4-5 L of oxygen with exertion  -We decided not to get heavier system because of how cumbersome at this  -We will continue to monitor your oxygen levels   ILD (interstitial lung disease) ([place]) IPF (idiopathic pulmonary fibrosis) ([place])  -Our radiologist feels strongly that the pulmonary fibrosis pattern that you have is more consistent with IPF as opposed to hypersensitive pneumonitis  -Glad you are tolerating nintedanib 100 mg twice daily well  -Still the disease is progressive with time  Plan -Continue nintedanib 100 mg twice daily -Okay to make an attempt at tapering prednisone  -Start with 10 mg of prednisone a day on Monday Wednesday Friday while attempting 5 mg of prednisone a day on Tuesday Thursday Saturday and Sunday  -If you are tolerating the schedule above quite well for a month then he can try to reduce to 5 mg once daily  -In the future we can consider clinical trials as a care option looking at inhaled nitric oxide therapy to improve physical activity or inhaled prostaglandins such as treprostinil.  We will have more information on this in future visits to introduce these concepts to you  Pedal edema  -You have chronic venous stasis edema secondary to the pulmonary fibrosis, venous incompetency and chronic prednisone.  Too bad compression stockings are causing significant pain.  Plan -Start  Lasix 20 mg once daily with potassium chloride 10 mEq once daily  Therapeutic drug monitoring  Plan -Check liver function test and chemistry panel today [this is for nintedanib and also for Lasix start]  Aortic valve calcification Coronary artery calcification seen on CAT scan  -There is a new finding seen on CT scan of the chest.  Noted that you have not had a cardiac stress test before and a previous echo was over a year ago at [facility] 2D echocardiogram in the next few weeks -Refer to either Dr. Haroldine Laws or Dr. Aundra Dubin or Dr. Angelena Form or Dr. Ellyn Hack or Dr. Debara Pickett in the next few to several weeks  -Based on the results of the echocardiogram and his symptoms they might or might not recommend a stress test and/or a  heart catheterization  Follow-up -8-10 weeks and a 30-minute slot with Dr. [name] to regroup    ( Level 05 visit: Estb 40-54 min   in  visit type: on-site physical face to visit  in total care time and counseling or/and coordination of care by this undersigned MD - Dr Brand Males. This includes one or more of the following on this same day 12/26/2019: pre-charting, chart review, note writing, documentation discussion of test results, diagnostic or treatment recommendations, prognosis, risks and benefits of management options, instructions, education, compliance or risk-factor reduction. It excludes time spent by the [facility] or office staff in the care of the patient. Actual time 90 min)   SIGNATURE    Dr. Brand Males, M.D., F.C.C.P,  Pulmonary and Critical Care Medicine Staff Physician, [facility] Director - Interstitial Lung Disease  Program  Pulmonary [facility] at [place], [place], [address]  Pager: [phone], If no answer or between  15:00h - 7:00h: call [phone] Telephone: [phone]  11:59 AM 12/26/2019

## 2020-01-06 ENCOUNTER — Telehealth: Payer: Self-pay | Admitting: Internal Medicine

## 2020-01-06 DIAGNOSIS — J849 Interstitial pulmonary disease, unspecified: Secondary | ICD-10-CM

## 2020-01-06 DIAGNOSIS — I2510 Atherosclerotic heart disease of native coronary artery without angina pectoris: Secondary | ICD-10-CM

## 2020-01-06 DIAGNOSIS — I359 Nonrheumatic aortic valve disorder, unspecified: Secondary | ICD-10-CM

## 2020-01-06 NOTE — Telephone Encounter (Signed)
Called and spoke with pt who stated she wanted to be referred to cardiologists Dr. Dorris Carnes or Dr. Alfonzo Beers. Referral has been placed specifying those cardiologists. Nothing further needed.

## 2020-01-13 DIAGNOSIS — J849 Interstitial pulmonary disease, unspecified: Secondary | ICD-10-CM | POA: Diagnosis not present

## 2020-01-20 DIAGNOSIS — J849 Interstitial pulmonary disease, unspecified: Secondary | ICD-10-CM | POA: Diagnosis not present

## 2020-01-27 ENCOUNTER — Other Ambulatory Visit: Payer: Self-pay

## 2020-01-27 ENCOUNTER — Ambulatory Visit (HOSPITAL_COMMUNITY): Payer: Medicare HMO | Attending: Cardiology

## 2020-01-27 DIAGNOSIS — I359 Nonrheumatic aortic valve disorder, unspecified: Secondary | ICD-10-CM | POA: Insufficient documentation

## 2020-01-27 DIAGNOSIS — I2510 Atherosclerotic heart disease of native coronary artery without angina pectoris: Secondary | ICD-10-CM | POA: Diagnosis not present

## 2020-01-27 LAB — ECHOCARDIOGRAM COMPLETE
Area-P 1/2: 3.53 cm2
S' Lateral: 2.3 cm

## 2020-01-27 MED ORDER — PERFLUTREN LIPID MICROSPHERE
1.0000 mL | INTRAVENOUS | Status: AC | PRN
  Administered 2020-01-27: 1 mL via INTRAVENOUS

## 2020-01-28 ENCOUNTER — Other Ambulatory Visit (HOSPITAL_COMMUNITY): Payer: Medicare HMO

## 2020-01-29 NOTE — Telephone Encounter (Signed)
FYI for MR

## 2020-01-31 ENCOUNTER — Ambulatory Visit: Payer: Medicare HMO | Admitting: Internal Medicine

## 2020-01-31 ENCOUNTER — Encounter: Payer: Self-pay | Admitting: Internal Medicine

## 2020-01-31 ENCOUNTER — Other Ambulatory Visit: Payer: Self-pay

## 2020-01-31 ENCOUNTER — Telehealth: Payer: Self-pay | Admitting: Internal Medicine

## 2020-01-31 VITALS — BP 118/60 | HR 88 | Ht 64.0 in | Wt 125.4 lb

## 2020-01-31 DIAGNOSIS — R601 Generalized edema: Secondary | ICD-10-CM

## 2020-01-31 DIAGNOSIS — R0602 Shortness of breath: Secondary | ICD-10-CM | POA: Diagnosis not present

## 2020-01-31 DIAGNOSIS — Z01812 Encounter for preprocedural laboratory examination: Secondary | ICD-10-CM | POA: Diagnosis not present

## 2020-01-31 NOTE — Telephone Encounter (Signed)
Dr. Harrington Challenger is working with Dr. Aundra Dubin to schedule a catheterization as soon as possible.   I did not hear from them today,Friday.   I have to have a covid test first and then quarantined for 3 days.  I would not expect to hear before Monday .   [facility]   Per patient [name]

## 2020-01-31 NOTE — Patient Instructions (Signed)
Medication Instructions:  No changes *If you need a refill on your cardiac medications before your next appointment, please call your pharmacy*   Lab Work: Today: bmet, cbc, bnp If you have labs (blood work) drawn today and your tests are completely normal, you will receive your results only by: [name] MyChart Message (if you have MyChart) OR . A paper copy in the mail If you have any lab test that is abnormal or we need to change your treatment, we will call you to review the results.   Testing/Procedures:  WE WILL CALL YOU WITH PLANS FOR RIGHT AND LEFT HEART CATHETERIZATION  Your physician has requested that you have a cardiac catheterization. Cardiac catheterization is used to diagnose and/or treat various heart conditions. Doctors may recommend this procedure for a number of different reasons. The most common reason is to evaluate chest pain. Chest pain can be a symptom of coronary artery disease (CAD), and cardiac catheterization can show whether plaque is narrowing or blocking your heart's arteries. This procedure is also used to evaluate the valves, as well as measure the blood flow and oxygen levels in different parts of your heart. For further information please visit [email or web address][facility][email or web address]. Please follow instruction sheet, as given.   Follow-Up: At [facility], you and your health needs are our priority.  As part of our continuing mission to provide you with exceptional heart care, we have created designated Provider Care Teams.  These Care Teams include your primary Cardiologist (physician) and Advanced Practice Providers (APPs -  Physician Assistants and Nurse Practitioners) who all work together to provide you with the care you need, when you need it.  Follow up with your physician will depend on test results.

## 2020-01-31 NOTE — H&P (View-Only) (Signed)
Cardiology Office Note   Date:  01/31/2020   ID:  [address] Terry, Conway 07-Dec-1934, MRN [id number]  PCP:  [name], MD  Cardiologist:   Dorris Carnes, MD   Pt referred by Ann Lions for eval of SOB and abnormal echo   History of Present Illness: [name] is a 85 y.o. female with a history of interstitial lung dz/  She has been followed at [facility] ([place]) as well as at [facility] ([place])  This has been followed for years .   One year ago she had an echo at [facility] that showed normal LV and RV function    PAP estimated at 44 mm Hg The pt has had a significant decline in hear ability to do things due to more SOB.  She recently had a CT done that showed progression in interstitial lung dz Aortic atherosclerosis and calcifications of LM and 3 V.  Echo was also done that showed normal LV function   The RV and RA were mildly enlarged and RVEF mildly depressed  Estimated PAP by TR jet was 96 mm Hg The pt has SOB  lmiting activity    No presyncope/syncope.    Current Meds  Medication Sig  . aspirin 81 MG EC tablet Take 81 mg by mouth daily.   [name] atorvastatin (LIPITOR) 20 MG tablet Take 10 mg by mouth daily.   . benzonatate (TESSALON) 200 MG capsule Take 1 capsule by mouth as needed.  . calcium carbonate (OS-CAL) 600 MG TABS Take 600 mg by mouth daily.  . furosemide (LASIX) 20 MG tablet Take 1 tablet (20 mg total) by mouth daily.  . Nintedanib (OFEV) 100 MG CAPS Take 1 capsule (100 mg total) by mouth 2 (two) times daily.  . potassium chloride (KLOR-CON) 10 MEQ tablet Take 1 tablet (10 mEq total) by mouth daily.  . predniSONE (DELTASONE) 10 MG tablet Take 10 mg by mouth daily with breakfast.     Allergies:   Prednisone   Past Medical History:  Diagnosis Date  . Hemorrhoids   . IPF (idiopathic pulmonary fibrosis) ([name])     Past Surgical History:  Procedure Laterality Date  . BREAST EXCISIONAL BIOPSY Left    x 2  . TONSILLECTOMY     age 66     Social History:  The patient   reports that she quit smoking about 32 years ago. Her smoking use included cigarettes. She has a 7.50 pack-year smoking history. She has never used smokeless tobacco. She reports current alcohol use of about 7.0 standard drinks of alcohol per week. She reports that she does not use drugs.   Family History:  The patient's family history includes Lung cancer in her brother; Stroke in her father.    ROS:  Please see the history of present illness. All other systems are reviewed and  Negative to the above problem except as noted.    PHYSICAL EXAM: VS:  BP 118/60   Pulse 88   Ht 5' 4" (1.626 m)   Wt 125 lb 6.4 oz (56.9 kg)   SpO2 (!) 87% Comment: uses oxygen  BMI 21.52 kg/m   GEN: Well nourished, well developed, in no acute distress  HEENT: normal  Neck: no JVD, carotid bruits Cardiac: RRR; S1, S2 (prom P2), RVheave.   Gr I/Vi sysotolic murmur LSB   1+ LE edema  Respiratory:  Rales at base with rhonchi   GI: soft, nontender, nondistended, + BS  No hepatomegaly  MS: no  deformity Moving all extremities   Skin: warm and dry, no rash Neuro:  Strength and sensation are intact Psych: euthymic mood, full affect   EKG:  EKG is ordered today.  SR 88   RAA.  LAD  Incomplete RBBB   Lipid Panel No results found for: CHOL, TRIG, HDL, CHOLHDL, VLDL, LDLCALC, LDLDIRECT    Wt Readings from Last 3 Encounters:  01/31/20 125 lb 6.4 oz (56.9 kg)  12/26/19 126 lb 3.2 oz (57.2 kg)  09/23/19 125 lb 12.8 oz (57.1 kg)      ASSESSMENT AND PLAN:  1  Pulmonary HTN  Pt with ILD now with evid of severe pulmonary HTN and RV dysfunciton    Discussed with pt   I would recomm R and L heart cath to define pressures   Pt could benefit from vasodilator Rx.  2  CAD  Pt with significant coronary calcifications.   She should have coronary anatomy defined.  3  HL  Keep on statin   Will need to have dose adjusted but I would hold until after cath  Check precath labs today    Risks and benefits discussed with pt  for above procedures.   Pt understands and agrees to proceed.   I have also discussed with Einar Crow who agrees to do procedure.    Activity as tolerated for now   No changes in meds   Current medicines are reviewed at length with the patient today.  The patient does not have concerns regarding medicines.  Signed, Dorris Carnes, MD  01/31/2020 10:35 AM    [address], [place], [place]  [address] Phone: [phone]; Fax: [phone]

## 2020-01-31 NOTE — Progress Notes (Signed)
Cardiology Office Note   Date:  01/31/2020   ID:  [address] [name], [name] 07-Dec-1934, MRN [id number]  PCP:  [name], MD  Cardiologist:   Dorris Carnes, MD   Pt referred by Ann Lions for eval of SOB and abnormal echo   History of Present Illness: [name] is a 85 y.o. female with a history of interstitial lung dz/  She has been followed at [facility] ([place]) as well as at [facility] ([place])  This has been followed for years .   One year ago she had an echo at [facility] that showed normal LV and RV function    PAP estimated at 44 mm Hg The pt has had a significant decline in hear ability to do things due to more SOB.  She recently had a CT done that showed progression in interstitial lung dz Aortic atherosclerosis and calcifications of LM and 3 V.  Echo was also done that showed normal LV function   The RV and RA were mildly enlarged and RVEF mildly depressed  Estimated PAP by TR jet was 96 mm Hg The pt has SOB  lmiting activity    No presyncope/syncope.    Current Meds  Medication Sig  . aspirin 81 MG EC tablet Take 81 mg by mouth daily.   [name] atorvastatin (LIPITOR) 20 MG tablet Take 10 mg by mouth daily.   . benzonatate (TESSALON) 200 MG capsule Take 1 capsule by mouth as needed.  . calcium carbonate (OS-CAL) 600 MG TABS Take 600 mg by mouth daily.  . furosemide (LASIX) 20 MG tablet Take 1 tablet (20 mg total) by mouth daily.  . Nintedanib (OFEV) 100 MG CAPS Take 1 capsule (100 mg total) by mouth 2 (two) times daily.  . potassium chloride (KLOR-CON) 10 MEQ tablet Take 1 tablet (10 mEq total) by mouth daily.  . predniSONE (DELTASONE) 10 MG tablet Take 10 mg by mouth daily with breakfast.     Allergies:   Prednisone   Past Medical History:  Diagnosis Date  . Hemorrhoids   . IPF (idiopathic pulmonary fibrosis) ([name])     Past Surgical History:  Procedure Laterality Date  . BREAST EXCISIONAL BIOPSY Left    x 2  . TONSILLECTOMY     age 66     Social History:  The patient   reports that she quit smoking about 32 years ago. Her smoking use included cigarettes. She has a 7.50 pack-year smoking history. She has never used smokeless tobacco. She reports current alcohol use of about 7.0 standard drinks of alcohol per week. She reports that she does not use drugs.   Family History:  The patient's family history includes Lung cancer in her brother; Stroke in her father.    ROS:  Please see the history of present illness. All other systems are reviewed and  Negative to the above problem except as noted.    PHYSICAL EXAM: VS:  BP 118/60   Pulse 88   Ht 5' 4" (1.626 m)   Wt 125 lb 6.4 oz (56.9 kg)   SpO2 (!) 87% Comment: uses oxygen  BMI 21.52 kg/m   GEN: Well nourished, well developed, in no acute distress  HEENT: normal  Neck: no JVD, carotid bruits Cardiac: RRR; S1, S2 (prom P2), RVheave.   Gr I/Vi sysotolic murmur LSB   1+ LE edema  Respiratory:  Rales at base with rhonchi   GI: soft, nontender, nondistended, + BS  No hepatomegaly  MS: no  deformity Moving all extremities   Skin: warm and dry, no rash Neuro:  Strength and sensation are intact Psych: euthymic mood, full affect   EKG:  EKG is ordered today.  SR 88   RAA.  LAD  Incomplete RBBB   Lipid Panel No results found for: CHOL, TRIG, HDL, CHOLHDL, VLDL, LDLCALC, LDLDIRECT    Wt Readings from Last 3 Encounters:  01/31/20 125 lb 6.4 oz (56.9 kg)  12/26/19 126 lb 3.2 oz (57.2 kg)  09/23/19 125 lb 12.8 oz (57.1 kg)      ASSESSMENT AND PLAN:  1  Pulmonary HTN  Pt with ILD now with evid of severe pulmonary HTN and RV dysfunciton    Discussed with pt   I would recomm R and L heart cath to define pressures   Pt could benefit from vasodilator Rx.  2  CAD  Pt with significant coronary calcifications.   She should have coronary anatomy defined.  3  HL  Keep on statin   Will need to have dose adjusted but I would hold until after cath  Check precath labs today    Risks and benefits discussed with pt  for above procedures.   Pt understands and agrees to proceed.   I have also discussed with Einar Crow who agrees to do procedure.    Activity as tolerated for now   No changes in meds   Current medicines are reviewed at length with the patient today.  The patient does not have concerns regarding medicines.  Signed, Dorris Carnes, MD  01/31/2020 10:35 AM    [address], [place], [place]  [address] Phone: [phone]; Fax: [phone]

## 2020-01-31 NOTE — Telephone Encounter (Signed)
Sounds good. THanks. Will close message. Reply not needed

## 2020-01-31 NOTE — Telephone Encounter (Signed)
[facility] = has elevated PASP on echo. She definitely needs RHC . Summer 2021 -> Tyvaso got approved for ILD PAH and we also have a nitric oxide study that can help with dyspne and activity  Thanks  Kyleigha Markert    SIGNATURE    Dr. Brand Males, M.D., F.C.C.P,  Pulmonary and Critical Care Medicine Staff Physician, [place] Director - Interstitial Lung Disease  Program  Pulmonary [place] at [place], [place], [address]  Pager: [phone], If no answer  OR between  19:00-7:00h: page [phone] Telephone (clinical office): [phone] Telephone (research): [phone]  12:52 PM 01/31/2020  [id number]   IMPRESSIONS    1. Left ventricular ejection fraction, by estimation, is 60 to 65%. The  left ventricle has normal function. The left ventricle has no regional  wall motion abnormalities. There is mild left ventricular hypertrophy.  Left ventricular diastolic parameters  are consistent with Grade I diastolic dysfunction (impaired relaxation).  2. Right ventricular systolic function is mildly reduced. The right  ventricular size is mildly enlarged. There is severely elevated pulmonary  artery systolic pressure. The estimated right ventricular systolic  pressure is 41.9 mmHg.  3. The mitral valve is normal in structure. No evidence of mitral valve  regurgitation. No evidence of mitral stenosis.  4. The aortic valve is tricuspid. Aortic valve regurgitation is not  visualized. Mild aortic valve sclerosis is present, with no evidence of  aortic valve stenosis.  5. The inferior vena cava is dilated in size with >50% respiratory  variability, suggesting right atrial pressure of 8 mmHg.   FINDINGS  Left Ventricle: Left ventricular ejection fraction, by estimation, is 60  to 65%. The left ventricle has normal function. The left ventricle has no  regional wall motion  abnormalities. The left ventricular internal cavity  size was normal in size. There is  mild left ventricular hypertrophy. Left ventricular diastolic parameters  are consistent with Grade I diastolic dysfunction (impaired relaxation).   Right Ventricle: The right ventricular size is mildly enlarged. No  increase in right ventricular wall thickness. Right ventricular systolic  function is mildly reduced. There is severely elevated pulmonary artery  systolic pressure. The tricuspid  regurgitant velocity is 4.71 m/s, and with an assumed right atrial  pressure of 8 mmHg, the estimated right ventricular systolic pressure is  91.4 mmHg.   Left Atrium: Left atrial size was normal in size.   Right Atrium: Right atrial size was normal in size.   Pericardium: Trivial pericardial effusion is present.   Mitral Valve: The mitral valve is normal in structure. No evidence of  mitral valve regurgitation. No evidence of mitral valve stenosis.   Tricuspid Valve: The tricuspid valve is normal in structure. Tricuspid  valve regurgitation is mild.   Aortic Valve: The aortic valve is tricuspid. Aortic valve regurgitation is  not visualized. Mild aortic valve sclerosis is present, with no evidence  of aortic valve stenosis.   Pulmonic Valve: The pulmonic valve was normal in structure. Pulmonic valve  regurgitation is not visualized.   Aorta: The aortic root is normal in size and structure.   Venous: The inferior vena cava is dilated in size with greater than 50%  respiratory variability, suggesting right atrial pressure of 8 mmHg.   IAS/Shunts: No atrial l

## 2020-02-01 LAB — CBC
Hematocrit: 40.7 % (ref 34.0–46.6)
Hemoglobin: 13.9 g/dL (ref 11.1–15.9)
MCH: 36.4 pg — ABNORMAL HIGH (ref 26.6–33.0)
MCHC: 34.2 g/dL (ref 31.5–35.7)
MCV: 107 fL — ABNORMAL HIGH (ref 79–97)
Platelets: 341 10*3/uL (ref 150–450)
RBC: 3.82 x10E6/uL (ref 3.77–5.28)
RDW: 12.7 % (ref 11.7–15.4)
WBC: 12.1 10*3/uL — ABNORMAL HIGH (ref 3.4–10.8)

## 2020-02-01 LAB — BASIC METABOLIC PANEL
BUN/Creatinine Ratio: 20 (ref 12–28)
BUN: 21 mg/dL (ref 8–27)
CO2: 24 mmol/L (ref 20–29)
Calcium: 10.1 mg/dL (ref 8.7–10.3)
Chloride: 102 mmol/L (ref 96–106)
Creatinine, Ser: 1.07 mg/dL — ABNORMAL HIGH (ref 0.57–1.00)
GFR calc Af Amer: 55 mL/min/{1.73_m2} — ABNORMAL LOW (ref >59)
GFR calc non Af Amer: 48 mL/min/{1.73_m2} — ABNORMAL LOW (ref >59)
Glucose: 92 mg/dL (ref 65–99)
Potassium: 4.6 mmol/L (ref 3.5–5.2)
Sodium: 141 mmol/L (ref 134–144)

## 2020-02-01 LAB — PRO B NATRIURETIC PEPTIDE: NT-Pro BNP: 734 pg/mL (ref 0–738)

## 2020-02-03 ENCOUNTER — Telehealth: Payer: Self-pay | Admitting: Internal Medicine

## 2020-02-03 NOTE — Telephone Encounter (Signed)
Pt has been notified of lab results by phone with verbal understanding. Pt states she called earlier and wanted to know when she is going to be scheduled for her heart cath. Pt states if it is going to be this week then she has other appt's that she will need to change. Pt would like a call back ASAP in regards to cath to be scheduled. Pt states she has to go out, though ok to leave a detailed message on home and cell #'s per pt. I assured her that I will let Dr. Alan Ripper nurse call earlier was about heart cath and when is it going to be. The patient has been notified of the result and verbalized understanding.  All questions (if any) were answered. Julaine Hua, [facility] 02/03/2020 2:29 PM

## 2020-02-03 NOTE — Telephone Encounter (Signed)
New message:    Patient calling stating that she need a test done and have not heard from any one. Please call back

## 2020-02-03 NOTE — Telephone Encounter (Signed)
Dr. [name] please advise on following My Chart message: Dr. [name] had suggested that I decrease the prednisone.  I tried reducing it and did not feel as good as I would like.  I have gone back to taking 5 mg twice a day.   That seems to help.  I would like for Dr. Alfonso Patten to contact [facility] at [facility] and renew my prescription for 5 mg. Twice a day.  I have only a few pills left.  I have not heard when the catheterization will be done and I had hoped to hear by today. Thanks you.   [name]. 01/10/1935 Thank you

## 2020-02-03 NOTE — Telephone Encounter (Signed)
Dr. Harrington Challenger and Dr. Aundra Dubin spoke about this patient. Dr. Aundra Dubin is going to arrange a R/L [facility] for this patient.  Will forward to H. Rose Fillers, RN

## 2020-02-05 NOTE — Telephone Encounter (Signed)
Yes, Dr Harrington Challenger has confirmed to me that she is going to get a [place] organized . Will be important because we can look at an approved inhaler to help with symptoms/effort tolerance and after that a study as well for same puprose

## 2020-02-05 NOTE — Telephone Encounter (Signed)
MR- pt sent another email today asking for new pred rx. Please see below msg from [place] and advise if okay to call her in pred 5 mg bid. Thanks!

## 2020-02-06 MED ORDER — PREDNISONE 5 MG PO TABS: 5.0000 mg | ORAL_TABLET | Freq: Two times a day (BID) | ORAL | 0 refills | Status: DC

## 2020-02-06 NOTE — Telephone Encounter (Signed)
Ok to send in prednisone 13m bid  She should get update on RHC by following with cardiogy on it

## 2020-02-10 ENCOUNTER — Telehealth: Payer: Self-pay | Admitting: *Deleted

## 2020-02-10 ENCOUNTER — Encounter: Payer: Self-pay | Admitting: *Deleted

## 2020-02-10 NOTE — Telephone Encounter (Signed)
Spoke with patient and reviewed plan and instructions for cath. She is scheduled for 02/14/20. Her instructions have been sent to her in [place]. She will call if any questions.  Message to Pre Cert, Dr. Aundra Dubin and his nurse as well as Dr. Harrington Challenger.

## 2020-02-11 ENCOUNTER — Telehealth: Payer: Self-pay | Admitting: *Deleted

## 2020-02-11 ENCOUNTER — Encounter: Payer: Self-pay | Admitting: *Deleted

## 2020-02-11 NOTE — Telephone Encounter (Signed)
Message from Dr. Claris Gladden nurse that 02/14/20 cath date will not work w Dr. Claris Gladden schedule.  I have rescheduled her for 02/13/20.  I spoke with the patient's husband who states this will be fine.  I adv that she feel free to call w any questions or concerns, including if she feels this date will not work for her.  New instruction letter w updated date sent to MyChart for review.

## 2020-02-12 ENCOUNTER — Other Ambulatory Visit (HOSPITAL_COMMUNITY)
Admission: RE | Admit: 2020-02-12 | Discharge: 2020-02-12 | Disposition: A | Discharge status: Home / Self Care | Payer: Medicare HMO | Source: Ambulatory Visit | Attending: Cardiology | Admitting: Cardiology

## 2020-02-12 ENCOUNTER — Telehealth: Payer: Self-pay | Admitting: *Deleted

## 2020-02-12 DIAGNOSIS — J849 Interstitial pulmonary disease, unspecified: Secondary | ICD-10-CM | POA: Diagnosis not present

## 2020-02-12 DIAGNOSIS — Z01812 Encounter for preprocedural laboratory examination: Secondary | ICD-10-CM | POA: Diagnosis not present

## 2020-02-12 DIAGNOSIS — Z20822 Contact with and (suspected) exposure to covid-19: Secondary | ICD-10-CM | POA: Diagnosis not present

## 2020-02-12 LAB — SARS CORONAVIRUS 2 (TAT 6-24 HRS): SARS Coronavirus 2: NEGATIVE

## 2020-02-12 NOTE — Telephone Encounter (Signed)
Reviewed procedure/mask/visitor instructions, COVID-19 questions with patient.

## 2020-02-12 NOTE — Telephone Encounter (Signed)
Patient called back returning Anne's call. Please call back

## 2020-02-12 NOTE — Telephone Encounter (Addendum)
Pt contacted pre-catheterization scheduled at [facility] for: Thursday February 13, 2020 1:30 PM Verified arrival time and place: [facility]) at: 11:30 AM   No solid food after midnight prior to cath, clear liquids until 5 AM day of procedure.  Hold: Lasix/KCl-day of procedure  Except hold medications AM meds can be  taken pre-cath with sips of water including: ASA 81 mg   Confirmed patient has responsible adult to drive home post procedure and be with patient first 24 hours after arriving home: yes  You are allowed ONE visitor in the waiting room during the time you are at the hospital for your procedure. Both you and your visitor must wear a mask once you enter the hospital.       COVID-19 Pre-Screening Questions:  . In the past 14 days have you had any symptoms concerning for COVID-19 infection (fever, chills, cough, or new shortness of breath)? no . In the past 14 days have you been around anyone with known Covid 19? no           LMTCB to review procedure instructions with patient.

## 2020-02-13 ENCOUNTER — Ambulatory Visit (HOSPITAL_COMMUNITY)
Admission: RE | Admit: 2020-02-13 | Discharge: 2020-02-13 | Disposition: A | Discharge status: Home / Self Care | Payer: Medicare HMO | Attending: Cardiology | Admitting: Cardiology

## 2020-02-13 ENCOUNTER — Other Ambulatory Visit: Payer: Self-pay

## 2020-02-13 ENCOUNTER — Encounter (HOSPITAL_COMMUNITY)
Admission: RE | Disposition: A | Discharge status: Home / Self Care | Payer: Self-pay | Source: Home / Self Care | Attending: Cardiology

## 2020-02-13 ENCOUNTER — Telehealth: Payer: Self-pay | Admitting: Internal Medicine

## 2020-02-13 DIAGNOSIS — I2510 Atherosclerotic heart disease of native coronary artery without angina pectoris: Secondary | ICD-10-CM | POA: Insufficient documentation

## 2020-02-13 DIAGNOSIS — Z87891 Personal history of nicotine dependence: Secondary | ICD-10-CM | POA: Diagnosis not present

## 2020-02-13 DIAGNOSIS — I2721 Secondary pulmonary arterial hypertension: Secondary | ICD-10-CM | POA: Diagnosis present

## 2020-02-13 DIAGNOSIS — Z79899 Other long term (current) drug therapy: Secondary | ICD-10-CM | POA: Diagnosis not present

## 2020-02-13 DIAGNOSIS — Z888 Allergy status to other drugs, medicaments and biological substances status: Secondary | ICD-10-CM | POA: Insufficient documentation

## 2020-02-13 DIAGNOSIS — I272 Pulmonary hypertension, unspecified: Secondary | ICD-10-CM

## 2020-02-13 DIAGNOSIS — E785 Hyperlipidemia, unspecified: Secondary | ICD-10-CM | POA: Diagnosis not present

## 2020-02-13 DIAGNOSIS — Z7982 Long term (current) use of aspirin: Secondary | ICD-10-CM | POA: Diagnosis not present

## 2020-02-13 HISTORY — PX: RIGHT/LEFT HEART CATH AND CORONARY ANGIOGRAPHY: CATH118266

## 2020-02-13 LAB — POCT I-STAT EG7
Acid-Base Excess: 2 mmol/L (ref 0.0–2.0)
Acid-Base Excess: 3 mmol/L — ABNORMAL HIGH (ref 0.0–2.0)
Bicarbonate: 27.4 mmol/L (ref 20.0–28.0)
Bicarbonate: 28.3 mmol/L — ABNORMAL HIGH (ref 20.0–28.0)
Calcium, Ion: 1.17 mmol/L (ref 1.15–1.40)
Calcium, Ion: 1.2 mmol/L (ref 1.15–1.40)
HCT: 35 % — ABNORMAL LOW (ref 36.0–46.0)
HCT: 37 % (ref 36.0–46.0)
Hemoglobin: 11.9 g/dL — ABNORMAL LOW (ref 12.0–15.0)
Hemoglobin: 12.6 g/dL (ref 12.0–15.0)
O2 Saturation: 70 %
O2 Saturation: 70 %
Potassium: 3.6 mmol/L (ref 3.5–5.1)
Potassium: 3.8 mmol/L (ref 3.5–5.1)
Sodium: 141 mmol/L (ref 135–145)
Sodium: 142 mmol/L (ref 135–145)
TCO2: 29 mmol/L (ref 22–32)
TCO2: 30 mmol/L (ref 22–32)
pCO2, Ven: 45 mmHg (ref 44.0–60.0)
pCO2, Ven: 46.3 mmHg (ref 44.0–60.0)
pH, Ven: 7.393 (ref 7.250–7.430)
pH, Ven: 7.395 (ref 7.250–7.430)
pO2, Ven: 37 mmHg (ref 32.0–45.0)
pO2, Ven: 37 mmHg (ref 32.0–45.0)

## 2020-02-13 SURGERY — RIGHT/LEFT HEART CATH AND CORONARY ANGIOGRAPHY
Anesthesia: LOCAL

## 2020-02-13 MED ORDER — SODIUM CHLORIDE 0.9% FLUSH: 3.0000 mL | INTRAVENOUS | Status: DC | PRN

## 2020-02-13 MED ORDER — IOHEXOL 350 MG/ML SOLN
INTRAVENOUS | Status: DC | PRN
  Administered 2020-02-13: 30 mL

## 2020-02-13 MED ORDER — VERAPAMIL HCL 2.5 MG/ML IV SOLN: INTRAVENOUS | Status: DC | PRN

## 2020-02-13 MED ORDER — LIDOCAINE HCL (PF) 1 % IJ SOLN
INTRAMUSCULAR | Status: DC | PRN
  Administered 2020-02-13 (×2): 2 mL

## 2020-02-13 MED ORDER — VERAPAMIL HCL 2.5 MG/ML IV SOLN
INTRAVENOUS | Status: AC
  Filled 2020-02-13: qty 2

## 2020-02-13 MED ORDER — LIDOCAINE HCL (PF) 1 % IJ SOLN
INTRAMUSCULAR | Status: AC
  Filled 2020-02-13: qty 30

## 2020-02-13 MED ORDER — ONDANSETRON HCL 4 MG/2ML IJ SOLN: 4.0000 mg | Freq: Four times a day (QID) | INTRAMUSCULAR | Status: DC | PRN

## 2020-02-13 MED ORDER — HEPARIN (PORCINE) IN NACL 1000-0.9 UT/500ML-% IV SOLN
INTRAVENOUS | Status: AC
  Filled 2020-02-13: qty 1000

## 2020-02-13 MED ORDER — HEPARIN SODIUM (PORCINE) 1000 UNIT/ML IJ SOLN
INTRAMUSCULAR | Status: AC
  Filled 2020-02-13: qty 1

## 2020-02-13 MED ORDER — MIDAZOLAM HCL 2 MG/2ML IJ SOLN
INTRAMUSCULAR | Status: AC
  Filled 2020-02-13: qty 2

## 2020-02-13 MED ORDER — HEPARIN (PORCINE) IN NACL 1000-0.9 UT/500ML-% IV SOLN
INTRAVENOUS | Status: AC
  Filled 2020-02-13: qty 500

## 2020-02-13 MED ORDER — FENTANYL CITRATE (PF) 100 MCG/2ML IJ SOLN
INTRAMUSCULAR | Status: AC
  Filled 2020-02-13: qty 2

## 2020-02-13 MED ORDER — SODIUM CHLORIDE 0.9 % IV SOLN: 250.0000 mL | INTRAVENOUS | Status: DC | PRN

## 2020-02-13 MED ORDER — HYDRALAZINE HCL 20 MG/ML IJ SOLN: 10.0000 mg | INTRAMUSCULAR | Status: DC | PRN

## 2020-02-13 MED ORDER — FENTANYL CITRATE (PF) 100 MCG/2ML IJ SOLN
INTRAMUSCULAR | Status: DC | PRN
  Administered 2020-02-13: 25 ug via INTRAVENOUS

## 2020-02-13 MED ORDER — SODIUM CHLORIDE 0.9 % WEIGHT BASED INFUSION: 1.0000 mL/kg/h | INTRAVENOUS | Status: DC

## 2020-02-13 MED ORDER — SODIUM CHLORIDE 0.9% FLUSH: 3.0000 mL | Freq: Two times a day (BID) | INTRAVENOUS | Status: DC

## 2020-02-13 MED ORDER — ACETAMINOPHEN 325 MG PO TABS: 650.0000 mg | ORAL_TABLET | ORAL | Status: DC | PRN

## 2020-02-13 MED ORDER — HEPARIN (PORCINE) IN NACL 1000-0.9 UT/500ML-% IV SOLN
INTRAVENOUS | Status: DC | PRN
  Administered 2020-02-13 (×2): 500 mL

## 2020-02-13 MED ORDER — ASPIRIN 81 MG PO CHEW: 81.0000 mg | CHEWABLE_TABLET | ORAL | Status: DC

## 2020-02-13 MED ORDER — HEPARIN SODIUM (PORCINE) 1000 UNIT/ML IJ SOLN
INTRAMUSCULAR | Status: DC | PRN
  Administered 2020-02-13: 3000 [IU] via INTRAVENOUS

## 2020-02-13 MED ORDER — SODIUM CHLORIDE 0.9 % WEIGHT BASED INFUSION
3.0000 mL/kg/h | INTRAVENOUS | Status: AC
  Administered 2020-02-13: 3 mL/kg/h via INTRAVENOUS

## 2020-02-13 MED ORDER — LABETALOL HCL 5 MG/ML IV SOLN: 10.0000 mg | INTRAVENOUS | Status: DC | PRN

## 2020-02-13 MED ORDER — MIDAZOLAM HCL 2 MG/2ML IJ SOLN
INTRAMUSCULAR | Status: DC | PRN
  Administered 2020-02-13: 1 mg via INTRAVENOUS

## 2020-02-13 SURGICAL SUPPLY — 14 items
CATH 5FR JL3.5 JR4 ANG PIG MP (CATHETERS) ×1 IMPLANT
CATH BALLN WEDGE 5F 110CM (CATHETERS) ×1 IMPLANT
DEVICE RAD COMP TR BAND LRG (VASCULAR PRODUCTS) ×1 IMPLANT
DEVICE RAD TR BAND REGULAR (VASCULAR PRODUCTS) ×1 IMPLANT
GLIDESHEATH SLEND SS 6F .021 (SHEATH) ×1 IMPLANT
GUIDEWIRE .025 260CM (WIRE) ×1 IMPLANT
GUIDEWIRE INQWIRE 1.5J.035X260 (WIRE) IMPLANT
INQWIRE 1.5J .035X260CM (WIRE) ×2
KIT HEART LEFT (KITS) ×2 IMPLANT
PACK CARDIAC CATHETERIZATION (CUSTOM PROCEDURE TRAY) ×2 IMPLANT
SHEATH GLIDE SLENDER 4/5FR (SHEATH) ×1 IMPLANT
TRANSDUCER W/STOPCOCK (MISCELLANEOUS) ×2 IMPLANT
WIRE HI TORQ VERSACORE-J 145CM (WIRE) ×1 IMPLANT
WIRE MICROINTRODUCER 60CM (WIRE) ×2 IMPLANT

## 2020-02-13 NOTE — Discharge Instructions (Signed)
Radial Site Care  Elevate for 24 hrs. Drink plenty of fluids to flush your kidneys.    This sheet gives you information about how to care for yourself after your procedure. Your health care provider may also give you more specific instructions. If you have problems or questions, contact your health care provider. What can I expect after the procedure? After the procedure, it is common to have:  Bruising and tenderness at the catheter insertion area. Follow these instructions at home: Medicines  Take over-the-counter and prescription medicines only as told by your health care provider. Insertion site care  Follow instructions from your health care provider about how to take care of your insertion site. Make sure you: ? Wash your hands with soap and water before you change your bandage (dressing). If soap and water are not available, use hand sanitizer. ? Change your dressing as told by your health care provider.   Check your insertion site every day for signs of infection. Check for: ? Redness, swelling, or pain. ? Fluid or blood. ? Pus or a bad smell. ? Warmth.  Do not take baths, swim, or use a hot tub until your health care provider approves.  You may shower 24-48 hours after the procedure, or as directed by your health care provider. ? Remove the dressing and gently wash the site with plain soap and water. ? Pat the area dry with a clean towel. ? Do not rub the site. That could cause bleeding.  Do not apply powder or lotion to the site. Activity   For 24 hours after the procedure, or as directed by your health care provider: ? Do not flex or bend the affected arm. ? Do not push or pull heavy objects with the affected arm. ? Do not drive yourself home from the hospital or clinic. You may drive 24 hours after the procedure unless your health care provider tells you not to. ? Do not operate machinery or power tools.  Do not lift anything that is heavier than 10 lb (4.5  kg), or the limit that you are told, until your health care provider says that it is safe.  Ask your health care provider when it is okay to: ? Return to work or school. ? Resume usual physical activities or sports. ? Resume sexual activity. General instructions  If the catheter site starts to bleed, raise your arm and put firm pressure on the site. If the bleeding does not stop, get help right away. This is a medical emergency.  If you went home on the same day as your procedure, a responsible adult should be with you for the first 24 hours after you arrive home.  Keep all follow-up visits as told by your health care provider. This is important. Contact a health care provider if:  You have a fever.  You have redness, swelling, or yellow drainage around your insertion site. Get help right away if:  You have unusual pain at the radial site.  The catheter insertion area swells very fast.  The insertion area is bleeding, and the bleeding does not stop when you hold steady pressure on the area.  Your arm or hand becomes pale, cool, tingly, or numb. These symptoms may represent a serious problem that is an emergency. Do not wait to see if the symptoms will go away. Get medical help right away. Call your local emergency services (911 in the [place] Do not drive yourself to the hospital. Summary  After the procedure,  it is common to have bruising and tenderness at the site.  Follow instructions from your health care provider about how to take care of your radial site wound. Check the wound every day for signs of infection.  Do not lift anything that is heavier than 10 lb (4.5 kg), or the limit that you are told, until your health care provider says that it is safe. This information is not intended to replace advice given to you by your health care provider. Make sure you discuss any questions you have with your health care provider. Document Revised: 03/22/2017 Document Reviewed:  03/22/2017 Elsevier Patient Education  2020 [employer].

## 2020-02-13 NOTE — Interval H&P Note (Signed)
History and Physical Interval Note:  02/13/2020 12:58 PM  [name]  has presented today for surgery, with the diagnosis of hp - rv disfunction.  The various methods of treatment have been discussed with the patient and family. After consideration of risks, benefits and other options for treatment, the patient has consented to  Procedure(s): RIGHT/LEFT HEART CATH AND CORONARY ANGIOGRAPHY (N/A) as a surgical intervention.  The patient's history has been reviewed, patient examined, no change in status, stable for surgery.  I have reviewed the patient's chart and labs.  Questions were answered to the patient's satisfaction.     [name] [employer]

## 2020-02-13 NOTE — Telephone Encounter (Signed)
Pt states she has cough from pulmonary fibrosis and drinking water helps relieve her cough. Pt advised okay to drink small amounts of water for her cough.

## 2020-02-13 NOTE — Telephone Encounter (Signed)
New message:     Patient calling because she need to speak with a nurse concering her not drinking water. Please call patient.

## 2020-02-14 ENCOUNTER — Encounter (HOSPITAL_COMMUNITY): Payer: Self-pay | Admitting: Cardiology

## 2020-02-18 DIAGNOSIS — H52203 Unspecified astigmatism, bilateral: Secondary | ICD-10-CM | POA: Diagnosis not present

## 2020-02-18 DIAGNOSIS — H524 Presbyopia: Secondary | ICD-10-CM | POA: Diagnosis not present

## 2020-02-18 DIAGNOSIS — Z961 Presence of intraocular lens: Secondary | ICD-10-CM | POA: Diagnosis not present

## 2020-02-18 DIAGNOSIS — H5213 Myopia, bilateral: Secondary | ICD-10-CM | POA: Diagnosis not present

## 2020-02-19 DIAGNOSIS — J849 Interstitial pulmonary disease, unspecified: Secondary | ICD-10-CM | POA: Diagnosis not present

## 2020-02-24 DIAGNOSIS — J9611 Chronic respiratory failure with hypoxia: Secondary | ICD-10-CM

## 2020-02-24 NOTE — Telephone Encounter (Signed)
Dr. [name] please respond to the following My Chart message:  I have an appointment with Dr. Aundra Dubin on January 6.  I [name] that you will have an opportunity to discuss my condition with him before that appointment.  I am hoping that the pulmonary and cardiac departments can combine to be of help to me. My breathing has become increasingly labored.  I am on 5 liters of oxygen all the time.   I feel it might be to my benefit to get a larger concentrator.  My oxygen level can easily drop below 85 or less with  any exertion.   Thank  you.     [name] Thank you

## 2020-02-26 NOTE — Telephone Encounter (Signed)
Ok for Animal nutritionist  Regarding cards - pulm communication - yes have directly d/w Dr Harrington Challenger who has d/w Dr Aundra Dubin. Dr Aundra Dubin is well versed with this issue. So no issues

## 2020-03-03 ENCOUNTER — Telehealth: Payer: Self-pay | Admitting: Internal Medicine

## 2020-03-03 NOTE — Telephone Encounter (Signed)
Spoke with [facility], states she needs an OV and a qualifying walk to be done before they can provide a larger O2 concentrator.  States that her concentrator goes up to 5lpm and her O2 requirements are 5lpm 24/7, so according to [place] she should be ok on her current concentrator.   lmtcb for pt- we need to schedule OV with qualifying walk for [facility] to be able to provide a larger concentrator.

## 2020-03-05 ENCOUNTER — Ambulatory Visit (HOSPITAL_COMMUNITY)
Admission: RE | Admit: 2020-03-05 | Discharge: 2020-03-05 | Disposition: A | Discharge status: Home / Self Care | Payer: Medicare HMO | Source: Ambulatory Visit | Attending: Cardiology | Admitting: Cardiology

## 2020-03-05 ENCOUNTER — Telehealth: Payer: Self-pay | Admitting: Internal Medicine

## 2020-03-05 ENCOUNTER — Other Ambulatory Visit: Payer: Self-pay

## 2020-03-05 ENCOUNTER — Other Ambulatory Visit: Payer: Self-pay | Admitting: Internal Medicine

## 2020-03-05 VITALS — BP 140/80 | HR 98 | Wt 127.2 lb

## 2020-03-05 DIAGNOSIS — Z7952 Long term (current) use of systemic steroids: Secondary | ICD-10-CM | POA: Diagnosis not present

## 2020-03-05 DIAGNOSIS — J849 Interstitial pulmonary disease, unspecified: Secondary | ICD-10-CM | POA: Insufficient documentation

## 2020-03-05 DIAGNOSIS — I272 Pulmonary hypertension, unspecified: Secondary | ICD-10-CM | POA: Diagnosis not present

## 2020-03-05 DIAGNOSIS — I2510 Atherosclerotic heart disease of native coronary artery without angina pectoris: Secondary | ICD-10-CM | POA: Diagnosis not present

## 2020-03-05 DIAGNOSIS — Z801 Family history of malignant neoplasm of trachea, bronchus and lung: Secondary | ICD-10-CM | POA: Diagnosis not present

## 2020-03-05 DIAGNOSIS — I2721 Secondary pulmonary arterial hypertension: Secondary | ICD-10-CM | POA: Insufficient documentation

## 2020-03-05 DIAGNOSIS — Z7982 Long term (current) use of aspirin: Secondary | ICD-10-CM | POA: Insufficient documentation

## 2020-03-05 DIAGNOSIS — I517 Cardiomegaly: Secondary | ICD-10-CM | POA: Insufficient documentation

## 2020-03-05 DIAGNOSIS — Z79899 Other long term (current) drug therapy: Secondary | ICD-10-CM | POA: Insufficient documentation

## 2020-03-05 DIAGNOSIS — E785 Hyperlipidemia, unspecified: Secondary | ICD-10-CM | POA: Diagnosis not present

## 2020-03-05 LAB — LIPID PANEL
Cholesterol: 263 mg/dL — ABNORMAL HIGH (ref 0–200)
HDL: 117 mg/dL (ref >40)
LDL Cholesterol: 127 mg/dL — ABNORMAL HIGH (ref 0–99)
Total CHOL/HDL Ratio: 2.2 RATIO
Triglycerides: 93 mg/dL (ref <150)
VLDL: 19 mg/dL (ref 0–40)

## 2020-03-05 MED ORDER — TYVASO 0.6 MG/ML IN SOLN: 18.0000 ug | Freq: Every day | RESPIRATORY_TRACT | Status: DC

## 2020-03-05 NOTE — Patient Instructions (Addendum)
START Tyvaso. Our pharmacists will be in contact with you about obtaining the medication  Labs done today, your results will be available in MyChart, we will contact you for abnormal readings.  Please wear your compression hose daily, place them on as soon as you get up in the morning and remove before you go to bed at night.  Your physician recommends that you schedule a follow-up appointment in: 6 weeks  If you have any questions or concerns before your next appointment please send [place] a message through [place] or call our office at [phone].    TO LEAVE A MESSAGE FOR THE NURSE SELECT OPTION 2, PLEASE LEAVE A MESSAGE INCLUDING: . YOUR NAME . DATE OF BIRTH . CALL BACK NUMBER . REASON FOR CALL**this is important as we prioritize the call backs  YOU WILL RECEIVE A CALL BACK THE SAME DAY AS LONG AS YOU CALL BEFORE 4:00 PM

## 2020-03-05 NOTE — Progress Notes (Signed)
PCP: [name], MD Pulmonary: Dr. [name] HF Cardiology: Dr. Aundra Dubin  86 y.o. with history of interstitial lung disease and pulmonary hypertension by echo was referred by Dr. [name] for evaluation of pulmonary hypertension.  Patient has the the diagnosis of ILD for several years now.  She is on home oxygen 5 L at rest.  She has started on Ofev in 1/21, and she is also on low dose prednisone.  Echo in 11/21 showed mild RV enlargement and mildly decreased RV systolic function.  PASP was estimated at 97 mmHg.  She had RHC/LHC in 12/21, showing mild CAD and mild-moderate pulmonary arterial hypertension with elevated PVR.    She is currently short of breath walking back and forth to the mailbox.  Symptoms have been slowly progressive.  She is no longer able to play golf.  No lightheadedness.  No chest pain.  No syncope. She wears compression stockings with peripheral edema.   6 min walk (1/22): 152 m  Labs (12/21): K 4.6, creatinine 1.07, pro-BNP 734  PMH: 1. Interstitial lung disease: Followed by Dr. [name].  - 5 L home oxygen.  - On Ofev.  2. Hyperlipidemia 3. CAD: LHC (12/21) with 80% ostial small D1, 30% pLAD.  4. Pulmonary hypertension: Suspect group 3 PH.  - Echo (11/21): EF 60-65%, mild LVH, mildly decreased RV systolic function with mild RV enlargement, PASP 97 mmHg.  - RHC (12/21): Mean RA 3, PA 50/16 mean 27, mean PCWP 4, CI 2.35, PVR 6.1 WU.   SH: Married, lives in [place].  Rare ETOH, no smoking.   Family History  Problem Relation Age of Onset  . Stroke Father   . Lung cancer Brother        mets   ROS: All systems reviewed and negative except as per HPI.   Current Outpatient Medications  Medication Sig Dispense Refill  . aspirin 81 MG EC tablet Take 81 mg by mouth daily.     [name] atorvastatin (LIPITOR) 20 MG tablet Take 20 mg by mouth daily.    . benzonatate (TESSALON) 200 MG capsule Take 200 mg by mouth 2 (two) times daily as needed for cough.    . Calcium  Carbonate+Vitamin D 600-200 MG-UNIT TABS Take 1 tablet by mouth daily.    . furosemide (LASIX) 20 MG tablet Take 20 mg by mouth daily as needed.    . Nintedanib (OFEV) 100 MG CAPS Take 1 capsule (100 mg total) by mouth 2 (two) times daily. 60 capsule 11  . potassium chloride (MICRO-K) 10 MEQ CR capsule Take 10 mEq by mouth daily as needed.    . predniSONE (DELTASONE) 5 MG tablet TAKE 1 TABLET (5 MG TOTAL) BY MOUTH 2 (TWO) TIMES DAILY WITH A MEAL. 60 tablet 1  . Treprostinil (TYVASO) 0.6 MG/ML SOLN Inhale 18 mcg into the lungs daily.     No current facility-administered medications for this encounter.   BP 140/80   Pulse 98   Wt 57.7 kg (127 lb 3.2 oz)   SpO2 93% Comment: 3 liters n/c  BMI 21.83 kg/m  General: NAD Neck: No JVD, no thyromegaly or thyroid nodule.  Lungs: Dry crackles at bases bilaterally.  CV: Nondisplaced PMI.  Heart regular S1/S2, no S3/S4, 1/6 HSM LLSB.  1+ ankle edema bilaterally.  No carotid bruit.  Normal pedal pulses.  Abdomen: Soft, nontender, no hepatosplenomegaly, no distention.  Skin: Intact without lesions or rashes.  Neurologic: Alert and oriented x 3.  Psych: Normal affect. Extremities: No clubbing or  cyanosis.  HEENT: Normal.   Assessment/Plan: 1. Pulmonary hypertension: Mild-moderate pulmonary arterial HTN on RHC in 12/21, not as impressive as PA pressure estimation on echo.  PVR significantly elevated at 6.1 WU.  Echo showed mild RV enlargement and mildly decreased RV systolic function.  I suspect group 3 PH due to interstitial lung disease.  Baseline 6 minute walk was done today.  - If she has not had a sleep study through pulmonary, this should be done.  - She will need a V/Q scan to formally rule out chronic PEs.  - I am going to work on getting her started on Tyvaso for ILD-related PAH.  We will start the paperwork.  - Based on RHC, I do not think she needs daily Lasix.  I suspect her chronic lower leg edema is more related to venous insufficiency =>  keep legs elevated and I will give her tighter compression stockings (20-30 mmHg).  2. CAD: Nonobstructive CAD on 12/21 cath.  - Continue atorvastatin, check lipids.  - Continue ASA 81 daily.  3. ILD: Per Dr. [name].  - Continue Ofev.  - Hopefully, can wean prednisone.    Followup in 6 wks.   [name] 03/05/2020

## 2020-03-05 NOTE — Telephone Encounter (Signed)
Ofev paperwork for Dr. [name] to sign placed in MR's mailbox.  Handicap placard signed by Aaron Edelman, NP and placed in out going mail for Patient.   Called and spoke with Patient and she is aware handicap placard is being mailed to Patient's home address.

## 2020-03-05 NOTE — Progress Notes (Signed)
6 Min Walk Test Completed  Pt ambulated 152.4 meters O2 Sat ranged 77-93% on 4L oxygen HR ranged 97-126.pt only completed 4 minutes.

## 2020-03-06 DIAGNOSIS — J9611 Chronic respiratory failure with hypoxia: Secondary | ICD-10-CM

## 2020-03-06 NOTE — Telephone Encounter (Signed)
Dr. [name], Please see patient comment and advise.  She is requesting a larger oxygen concentrator, one that goes to 10 liters.  Thank you.

## 2020-03-09 NOTE — Telephone Encounter (Signed)
MR- please advise on pt email, thanks!  [name], [place]  P [facility] Pool Dr. [name]. I saw Dr. Aundra Dubin last Thursday. He would like for me to go on TYVASCO. He seems to think it might help with my breathing. I would like to know what you think about this because you did not suggest that I go on an inhaler like this. I am sure there's some reason why you did not suggest it earlier. Do you think it will do any good because I have to be a approved in order to get the medicine because of the expense.  I will look forward to hearing from you. [facility]

## 2020-03-09 NOTE — Telephone Encounter (Signed)
Ok to give concentrator that can go upto 10L

## 2020-03-09 NOTE — Telephone Encounter (Signed)
Order has been placed. Patient is aware.

## 2020-03-10 ENCOUNTER — Telehealth (HOSPITAL_COMMUNITY): Payer: Self-pay | Admitting: Pharmacist

## 2020-03-10 ENCOUNTER — Telehealth: Payer: Self-pay | Admitting: Internal Medicine

## 2020-03-10 NOTE — Telephone Encounter (Signed)
I believe I have mentioned it to her. Is the very reason why I wanted her to have Dr Aundra Dubin do the Right heart cath is to go on tyvaso. Is possible when he gave her the details of tyvaso she thought was new but yes is indicated and could help with exretional distance and shortness of breath . Difference will be seen after few months/several week .   Please let her know that we can start paperwork for tyvaso   Once we get her settled on this we can look at inhaled nitric oxide research study - but we can discuss this later   Right Heart Pressures RHC Procedural Findings:02/13/20 Hemodynamics (mmHg) RA mean 3 RV 45/1 PA 50/16, mean 27 PCWP mean 4 LV 133/10 AO 130/65  Oxygen saturations: PA 70% AO 100%  Cardiac Output (Fick) 3.74  Cardiac Index (Fick) 2.35 PVR 6.1 WU

## 2020-03-10 NOTE — Telephone Encounter (Signed)
Patient Advocate Encounter   Received notification from [facility] that pre-certification for Tyvaso is required.   PA submitted via fax Status is pending   Will continue to follow.   Audry Riles, PharmD, BCPS, BCCP, CPP Heart Failure Clinic Pharmacist [phone]'

## 2020-03-10 NOTE — Telephone Encounter (Signed)
Looks like we sent the referral for the 10 lpm concentrator 03/09/20  Will call Gilda to confirm they received this tomorrow 03/11/20

## 2020-03-11 NOTE — Telephone Encounter (Signed)
I believe the below response is pertaining to pt email dated 03/09/20- I copied and pasted MR's response regarding Tyvaso and have emailed the pt back.    This msg is pertaining to o2 order for 10 lpm concentrator  I called [facility] and spoke with Radford Pax stated they can not provide the pt with a 10 lpm concentrator bc they do not have testing stating that she needs more than 5lpm   Pt will need to come do walk test-called the pt and there was no answer, LMTCB

## 2020-03-11 NOTE — Telephone Encounter (Signed)
email sent  Would you expect me to have difficulty in inhaling the medication?  I have difficulty in taking a breath without coughing  at this point. Dr. Claris Gladden office is starting the process of getting me on   the Tyvasco.  Dr. [name] please advise

## 2020-03-12 ENCOUNTER — Telehealth (HOSPITAL_COMMUNITY): Payer: Self-pay

## 2020-03-12 MED ORDER — ATORVASTATIN CALCIUM 40 MG PO TABS: 40.0000 mg | ORAL_TABLET | Freq: Every day | ORAL | 6 refills | Status: DC

## 2020-03-12 NOTE — Telephone Encounter (Signed)
-----  Message from Larey Dresser, MD sent at 03/06/2020 11:32 AM EST ----- LDL high, increase atorvastatin to 40 mg daily with lipids/LFTs in 2 months.

## 2020-03-12 NOTE — Telephone Encounter (Signed)
Pt aware of results and recommendations to increase atorvastatin to 53m. Pt has appt in march and will repeat at visit.

## 2020-03-13 NOTE — Telephone Encounter (Signed)
lmtcb for pt.  

## 2020-03-13 NOTE — Telephone Encounter (Signed)
Open phone encounter as well in triage which is also due to the tyvaso medication and the O2 concentrator. Closing this encounter.

## 2020-03-13 NOTE — Telephone Encounter (Signed)
1. Apr 16, 2020 appt with me is fine 2. If she can tolerate tyvaso or not -> only time will tell but she needs it and she does have sthretnght to take it. So she should start it 3. Walk test needed for [facility] - per Ashley Akin

## 2020-03-13 NOTE — Telephone Encounter (Signed)
Attempted to call pt but unable to reach. Left message for her to return call.

## 2020-03-13 NOTE — Telephone Encounter (Signed)
Pt returning missed call. Prefers to be called on home # at [phone]

## 2020-03-14 DIAGNOSIS — J849 Interstitial pulmonary disease, unspecified: Secondary | ICD-10-CM | POA: Diagnosis not present

## 2020-03-17 NOTE — Telephone Encounter (Signed)
Advanced Heart Failure Patient Advocate Encounter  Prior Authorization for Tyvaso has been approved.   Effective dates: 03/10/20 through 03/10/21  Audry Riles, PharmD, BCPS, BCCP, CPP Heart Failure Clinic Pharmacist [phone]

## 2020-03-19 NOTE — Telephone Encounter (Signed)
ATC patient unable to reach , patient sent email.  I messaged Gilda at [place] and this was her response   Patient we would need to have oxygen testing to show the medical necessity for a 10L concentrator. The order stated for patient to be on 5L and the testing from her cardiologist visit showed she needs 4L of oxygen. Patient would need to recover on oxygen above 5L for a 10L concentrator. Also, patient has a POC as well and she would need to be switched tanks.   Patient needs this done, either before or on the day of her appointment.

## 2020-03-20 NOTE — Telephone Encounter (Signed)
[name], I am routing this message to you since the schedule is not open for the 6 minute walks for 04/03/20.  Thank you.

## 2020-03-20 NOTE — Telephone Encounter (Signed)
Multiple encounters regarding this. Will close this duplicate encounter.

## 2020-03-21 DIAGNOSIS — J849 Interstitial pulmonary disease, unspecified: Secondary | ICD-10-CM | POA: Diagnosis not present

## 2020-03-25 ENCOUNTER — Other Ambulatory Visit: Payer: Self-pay

## 2020-03-25 ENCOUNTER — Telehealth (HOSPITAL_COMMUNITY): Payer: Self-pay | Admitting: Pharmacy Technician

## 2020-03-25 ENCOUNTER — Ambulatory Visit: Payer: Medicare HMO

## 2020-03-25 DIAGNOSIS — J84112 Idiopathic pulmonary fibrosis: Secondary | ICD-10-CM

## 2020-03-25 NOTE — Telephone Encounter (Signed)
Received a message that the patient needs to give financial information to Tyvaso assistance in order to determine eligibility. Apparently the patient denied giving that information to the representative.  Called and left the patient a message to follow up with me. In order to determine her eligibility for assistance, she would have to provide this information to [place], CPhT

## 2020-03-25 NOTE — Progress Notes (Signed)
Six Minute Walk - 03/25/20 1557      Six Minute Walk   Medications taken before test (dose and time) Aspirin, Atorvasatin, Ofev and Prednisone at 8am    Supplemental oxygen during test? Yes    O2 Flow Rate (L/min) 8 L/min   Patient started on 3 liters and ended on 8 liters   Type Continuous    Lap distance in meters  34 meters    Laps Completed 8    Baseline BP (sitting) 120/80    Baseline Heartrate 88    Baseline Dyspnea (Borg Scale) 3    Baseline Fatigue (Borg Scale) 0.5    Baseline SPO2 95 %   3 liters     End of Test Values    BP (sitting) 138/80    Heartrate 103    Dyspnea (Borg Scale) 8    Fatigue (Borg Scale) 4    SPO2 95 %   8 liters     2 Minutes Post Walk Values   BP (sitting) 130/80    Heartrate 81    SPO2 99 %    Stopped or paused before six minutes? Yes    Reason: Patient had to stop several times due to her O2 dropping below 88%.    Other Symptoms at end of exercise: --   No complaints     Interpretation   Tech Comments: Patient started at 77% on room air. Came in with POC but was not wearing it when I went and got her from the lobby. Placed patient on 3 liters and she recovered to 95%, started 6 minute walk and she dropped to 86%, patient bumped up to 4 liters and recovered to 96%, started walking again and patient eventually dropped to 88%, patient bumped up to 6 liters and eventually dropped down to 88%, patient bumped up to 8 liters and maintained. Patient is requesting bigger concentrator for home and will not be able to use POC needing 8 liters.

## 2020-03-25 NOTE — Telephone Encounter (Signed)
Spoke to patient and provided the phone number to [facility] [employer] to her husband. [phone]  The patient is going to call them later on today and provide the necessary financial information needed to determine financial eligibility.

## 2020-03-30 DIAGNOSIS — J9611 Chronic respiratory failure with hypoxia: Secondary | ICD-10-CM

## 2020-03-30 NOTE — Telephone Encounter (Signed)
Patient came in last week to do 6 minute walk and is requesting bigger concentrator. She required 8 liters of oxygen during test. Please advise if order can be sent to DME  I just spoke to [employer] and they do NOT have the testing report from you so they can get a larger concentrator for me.  I think my walking test  on last Wednesday qualified me for a larger unit.   Could you send them the paper work so I can get the unit? Thanks you    [facility].  07/29/1934

## 2020-03-30 NOTE — Telephone Encounter (Signed)
The patient spoke with Etta Quill ([employer]) and signed a patient authorization document. Her case has now been assigned to [facility]. Andee Poles is going to reach out to the patient today, to start the eligibility process for patient assistance. I informed the representative that [facility] was attempting to do the same. She stated that they will speak with [facility] about the patient's insurance coverage, after speaking with the patient.   They will fax over any forms that the office may need to fill out. There are none needed at this time.   [facility] gave me their insurance/co-pay team number [phone] ext [phone] for the patient to call. I provided the patient with the number as well.

## 2020-04-01 NOTE — Telephone Encounter (Signed)
Yes he may send order to the DME company for her to use oxygen up to 10 L with exertion

## 2020-04-02 ENCOUNTER — Other Ambulatory Visit (HOSPITAL_COMMUNITY): Payer: Self-pay | Admitting: Internal Medicine

## 2020-04-02 ENCOUNTER — Other Ambulatory Visit: Payer: Medicare HMO

## 2020-04-02 ENCOUNTER — Encounter (HOSPITAL_COMMUNITY): Payer: Self-pay

## 2020-04-02 ENCOUNTER — Telehealth: Payer: Self-pay | Admitting: Internal Medicine

## 2020-04-02 ENCOUNTER — Telehealth (HOSPITAL_COMMUNITY): Payer: Self-pay

## 2020-04-02 DIAGNOSIS — J9611 Chronic respiratory failure with hypoxia: Secondary | ICD-10-CM

## 2020-04-02 DIAGNOSIS — R059 Cough, unspecified: Secondary | ICD-10-CM | POA: Diagnosis not present

## 2020-04-02 DIAGNOSIS — Z20822 Contact with and (suspected) exposure to covid-19: Secondary | ICD-10-CM

## 2020-04-02 DIAGNOSIS — U071 COVID-19: Secondary | ICD-10-CM | POA: Diagnosis not present

## 2020-04-02 DIAGNOSIS — J84112 Idiopathic pulmonary fibrosis: Secondary | ICD-10-CM

## 2020-04-02 DIAGNOSIS — R0981 Nasal congestion: Secondary | ICD-10-CM | POA: Diagnosis not present

## 2020-04-02 DIAGNOSIS — J849 Interstitial pulmonary disease, unspecified: Secondary | ICD-10-CM

## 2020-04-02 MED ORDER — NIRMATRELVIR/RITONAVIR (PAXLOVID)TABLET: 2.0000 | ORAL_TABLET | Freq: Two times a day (BID) | ORAL | 0 refills | Status: AC

## 2020-04-02 MED FILL — PAXLOVID 20 X 150 MG & 10 X: 20 X 150 MG | 5 days supply | Qty: 20 | Fill #0

## 2020-04-02 NOTE — Telephone Encounter (Signed)
Has just been taken care of?.  I thought I sent a reply to triage yesterday.  It is okay to send her a larger concentrator going up to 10 L of oxygen

## 2020-04-02 NOTE — Telephone Encounter (Signed)
Called let patient know all of Dr. Golden Pop recommendations. Patient was on phone with infusion clinic. Spoke with patient's husband. They are going to wait and see if the test they had done at [facility] today comes back negative before they decide on the course of prednisone. Husband stated they would go to the ED if her oxygen went below 88%.   Will wait for patient's results to [facility] covid test

## 2020-04-02 NOTE — Telephone Encounter (Signed)
Pt stated that they took a self-home test (04/02/20) and was (+); symptoms; no fever, cough (pulmonary fibrosis), coughing up film but no color, no wheezing, pt is fully vaccinated, wanting medical advice. Also, pt stated that they are at their max of oxygen with 5 L and I did provide the pt with [phone] (testing number) being she wanted to be at a physical location to confirm and not solely rely on the home test. Pls regard [phone]

## 2020-04-02 NOTE — Telephone Encounter (Signed)
Called spoke with patient.  She has an appointment for a PCR test today at 1030 she wanted this to confirm her at home positive test.  She is only having the cough. Her oxygen is 80's with out her 5L and she says this is all the time nothing new. I explained to her our recommendations was if on the 5L your oxygen drops below 88% you need to call 911 or go to the ED. Patient states she was told to not go to the ED ever because they cannot help her or treat what is wrong with her. I tried to explain to her that with testing for covid that changes a little. I told her to stay on her 5L and monitor her oxygen. She still does not believe she needs or should go to ED. I told her we would refer her to the treatment clinic they would call her if they feel she qualifies for treatment.  I told her I would let Dr. [name] know she tested positive.    Dr. [name] please advise

## 2020-04-02 NOTE — Telephone Encounter (Signed)
Order was already placed per office guidelines.

## 2020-04-02 NOTE — Progress Notes (Signed)
Outpatient Oral COVID Treatment Note  I connected with [name] on 04/02/2020/4:51 PM by telephone and verified that I am speaking with the correct person using two identifiers.  I discussed the limitations, risks, security, and privacy concerns of performing an evaluation and management service by telephone and the availability of in person appointments. I also discussed with the patient that there may be a patient responsible charge related to this service. The patient expressed understanding and agreed to proceed.  Patient location: home Provider location: remote  Diagnosis: COVID-19 infection  Purpose of visit: Discussion of potential use of Molnupiravir or Paxlovid, a new treatment for mild to moderate COVID-19 viral infection in non-hospitalized patients.   Subjective: Patient is a 86 y.o. female who has been diagnosed with COVID 19 viral infection.  Their symptoms began on 2/2 with cough and congestion.   Past Medical History:  Diagnosis Date  . Hemorrhoids   . IPF (idiopathic pulmonary fibrosis) (HCC)     Allergies  Allergen Reactions  . 5-Alpha Reductase Inhibitors   . Prednisone     Hallucination when given in high doses      Current Outpatient Medications:  .  aspirin 81 MG EC tablet, Take 81 mg by mouth daily. , Disp: , Rfl:  .  atorvastatin (LIPITOR) 40 MG tablet, Take 1 tablet (40 mg total) by mouth daily., Disp: 30 tablet, Rfl: 6 .  benzonatate (TESSALON) 200 MG capsule, Take 200 mg by mouth 2 (two) times daily as needed for cough., Disp: , Rfl:  .  Calcium Carbonate+Vitamin D 600-200 MG-UNIT TABS, Take 1 tablet by mouth daily., Disp: , Rfl:  .  furosemide (LASIX) 20 MG tablet, Take 20 mg by mouth daily as needed., Disp: , Rfl:  .  Nintedanib (OFEV) 100 MG CAPS, Take 1 capsule (100 mg total) by mouth 2 (two) times daily., Disp: 60 capsule, Rfl: 11 .  potassium chloride (MICRO-K) 10 MEQ CR capsule, Take 10 mEq by mouth daily as needed., Disp: , Rfl:  .  predniSONE  (DELTASONE) 5 MG tablet, TAKE 1 TABLET (5 MG TOTAL) BY MOUTH 2 (TWO) TIMES DAILY WITH A MEAL., Disp: 60 tablet, Rfl: 1 .  Treprostinil (TYVASO) 0.6 MG/ML SOLN, Inhale 18 mcg into the lungs daily., Disp: , Rfl:   Objective: Patient appears/sounds well. They are in no apparent distress.  Breathing is non labored.  Mood and behavior are normal.  Laboratory Data:  Recent Results (from the past 2160 hour(s))  ECHOCARDIOGRAM COMPLETE     Status: None   Collection Time: 01/27/20  3:25 PM  Result Value Ref Range   Area-P 1/2 3.53 cm2   S' Lateral 2.30 cm  Basic metabolic panel     Status: Abnormal   Collection Time: 01/31/20 11:24 AM  Result Value Ref Range   Glucose 92 65 - 99 mg/dL   BUN 21 8 - 27 mg/dL   Creatinine, Ser 1.07 (H) 0.57 - 1.00 mg/dL   GFR calc non Af Amer 48 (L) >59 mL/min/1.73   GFR calc Af Amer 55 (L) >59 mL/min/1.73    Comment: **In accordance with recommendations from the NKF-ASN Task force,**   [facility] is in the process of updating its eGFR calculation to the   2021 CKD-EPI creatinine equation that estimates kidney function   without a race variable.    BUN/Creatinine Ratio 20 12 - 28   Sodium 141 134 - 144 mmol/L   Potassium 4.6 3.5 - 5.2 mmol/L   Chloride 102  96 - 106 mmol/L   CO2 24 20 - 29 mmol/L   Calcium 10.1 8.7 - 10.3 mg/dL  CBC     Status: Abnormal   Collection Time: 01/31/20 11:24 AM  Result Value Ref Range   WBC 12.1 (H) 3.4 - 10.8 x10E3/uL   RBC 3.82 3.77 - 5.28 x10E6/uL   Hemoglobin 13.9 11.1 - 15.9 g/dL   Hematocrit 40.7 34.0 - 46.6 %   MCV 107 (H) 79 - 97 fL   MCH 36.4 (H) 26.6 - 33.0 pg   MCHC 34.2 31.5 - 35.7 g/dL   RDW 12.7 11.7 - 15.4 %   Platelets 341 150 - 450 x10E3/uL  Pro b natriuretic peptide (BNP)     Status: None   Collection Time: 01/31/20 11:24 AM  Result Value Ref Range   NT-Pro BNP 734 0 - 738 pg/mL    Comment: The following cut-points have been suggested for the use of proBNP for the diagnostic evaluation of heart failure  (HF) in patients with acute dyspnea: Modality                     Age           Optimal Cut                            (years)            Point ------------------------------------------------------ Diagnosis (rule in HF)        <50            450 pg/mL                           50 - 75            900 pg/mL                               >75           1800 pg/mL Exclusion (rule out HF)  Age independent     300 pg/mL   SARS CORONAVIRUS 2 (TAT 6-24 HRS) Nasopharyngeal Nasopharyngeal Swab     Status: None   Collection Time: 02/12/20 11:10 AM   Specimen: Nasopharyngeal Swab  Result Value Ref Range   SARS Coronavirus 2 NEGATIVE NEGATIVE    Comment: (NOTE) SARS-CoV-2 target nucleic acids are NOT DETECTED.  The SARS-CoV-2 RNA is generally detectable in upper and lower respiratory specimens during the acute phase of infection. Negative results do not preclude SARS-CoV-2 infection, do not rule out co-infections with other pathogens, and should not be used as the sole basis for treatment or other patient management decisions. Negative results must be combined with clinical observations, patient history, and epidemiological information. The expected result is Negative.  Fact Sheet for Patients: SugarRoll.be  Fact Sheet for Healthcare Providers: https://www.woods-mathews.com/  This test is not yet approved or cleared by the [place] FDA and  has been authorized for detection and/or diagnosis of SARS-CoV-2 by FDA under an Emergency Use Authorization (EUA). This EUA will remain  in effect (meaning this test can be used) for the duration of the COVID-19 declaration under Se ction 564(b)(1) of the Act, 21 U.S.C. section 360bbb-3(b)(1), unless the authorization is terminated or revoked sooner.  Performed at [facility], [place] [address] [place], [place] [address]   POCT I-Stat EG7  Status: Abnormal   Collection Time: 02/13/20  1:36 PM   Result Value Ref Range   pH, Ven 7.393 7.250 - 7.430   pCO2, Ven 45.0 44.0 - 60.0 mmHg   pO2, Ven 37.0 32.0 - 45.0 mmHg   Bicarbonate 27.4 20.0 - 28.0 mmol/L   TCO2 29 22 - 32 mmol/L   O2 Saturation 70.0 %   Acid-Base Excess 2.0 0.0 - 2.0 mmol/L   Sodium 142 135 - 145 mmol/L   Potassium 3.6 3.5 - 5.1 mmol/L   Calcium, Ion 1.17 1.15 - 1.40 mmol/L   HCT 35.0 (L) 36.0 - 46.0 %   Hemoglobin 11.9 (L) 12.0 - 15.0 g/dL   Sample type VENOUS    Comment NOTIFIED PHYSICIAN   POCT I-Stat EG7     Status: Abnormal   Collection Time: 02/13/20  1:36 PM  Result Value Ref Range   pH, Ven 7.395 7.250 - 7.430   pCO2, Ven 46.3 44.0 - 60.0 mmHg   pO2, Ven 37.0 32.0 - 45.0 mmHg   Bicarbonate 28.3 (H) 20.0 - 28.0 mmol/L   TCO2 30 22 - 32 mmol/L   O2 Saturation 70.0 %   Acid-Base Excess 3.0 (H) 0.0 - 2.0 mmol/L   Sodium 141 135 - 145 mmol/L   Potassium 3.8 3.5 - 5.1 mmol/L   Calcium, Ion 1.20 1.15 - 1.40 mmol/L   HCT 37.0 36.0 - 46.0 %   Hemoglobin 12.6 12.0 - 15.0 g/dL   Sample type VENOUS   Lipid Profile     Status: Abnormal   Collection Time: 03/05/20  4:14 PM  Result Value Ref Range   Cholesterol 263 (H) 0 - 200 mg/dL   Triglycerides 93 <150 mg/dL   HDL 117 >40 mg/dL   Total CHOL/HDL Ratio 2.2 RATIO   VLDL 19 0 - 40 mg/dL   LDL Cholesterol 127 (H) 0 - 99 mg/dL    Comment:        Total Cholesterol/HDL:CHD Risk Coronary Heart Disease Risk Table                     Men   Women  1/2 Average Risk   3.4   3.3  Average Risk       5.0   4.4  2 X Average Risk   9.6   7.1  3 X Average Risk  23.4   11.0        Use the calculated Patient Ratio above and the CHD Risk Table to determine the patient's CHD Risk.        ATP III CLASSIFICATION (LDL):  <100     mg/dL   Optimal  100-129  mg/dL   Near or Above                    Optimal  130-159  mg/dL   Borderline  160-189  mg/dL   High  >190     mg/dL   Very High Performed at [facility] [address] [place], [place] [address]       Assessment: 86 y.o. female with mild/moderate COVID 19 viral infection diagnosed on 04/01/20 at high risk for progression to severe COVID 19.  Plan:  This patient is a 86 y.o. female that meets the following criteria for Emergency Use Authorization of: Paxlovid 1. Age >12 yr AND > 40 kg 2. SARS-COV-2 positive test 3. Symptom onset < 5 days 4. Mild-to-moderate COVID disease with high risk for severe  progression to hospitalization or death  I have spoken and communicated the following to the patient or parent/caregiver regarding: 1. Paxlovid is an unapproved drug that is authorized for use under an Emergency Use Authorization.  2. There are no adequate, approved, available products for the treatment of COVID-19 in adults who have mild-to-moderate COVID-19 and are at high risk for progressing to severe COVID-19, including hospitalization or death. 3. Other therapeutics are currently authorized. For additional information on all products authorized for treatment or prevention of COVID-19, please see TanEmporium.pl.  4. There are benefits and risks of taking this treatment as outlined in the "Fact Sheet for Patients and Caregivers."  5. "Fact Sheet for Patients and Caregivers" was reviewed with patient. A hard copy will be provided to patient from pharmacy prior to the patient receiving treatment. 6. Patients should continue to self-isolate and use infection control measures (e.g., wear mask, isolate, social distance, avoid sharing personal items, clean and disinfect "high touch" surfaces, and frequent handwashing) according to CDC guidelines.  7. The patient or parent/caregiver has the option to accept or refuse treatment. 8. Patient medication history was reviewed for potential drug interactions:Interaction with home meds: Lipitor and Ofev that she has been instructed to hold while on new  med 9. Patient's GFR was calculated to be 52, and they were therefore prescribed Reduced dose (GFR 30-60) - nirmatrelvir 167m tab (1 tablet) by mouth twice daily AND ritonavir 1057mtab (1 tablet) by mouth twice daily   After reviewing above information with the patient, the patient agrees to receive Paxlovid.  Follow up instructions:    . Take prescription BID x 5 days as directed . Reach out to pharmacist for counseling on medication if desired . For concerns regarding further COVID symptoms please follow up with your PCP or urgent care . For urgent or life-threatening issues, seek care at your local emergency department  The patient was provided an opportunity to ask questions, and all were answered. The patient agreed with the plan and demonstrated an understanding of the instructions.   [facility]  The patient was advised to call their PCP or seek an in-person evaluation if the symptoms worsen or if the condition fails to improve as anticipated.   I provided 60 minutes of non face-to-face telephone visit time during this encounter, and > 50% was spent counseling as documented under my assessment & plan.  LaAlan RipperNPSeacliff

## 2020-04-02 NOTE — Telephone Encounter (Signed)
Please place order per Dr. [name] for [facility] - [phone] - I will alert the team to call the patient. Thanks.  Lazaro Arms, FNP-C

## 2020-04-02 NOTE — Telephone Encounter (Incomplete)
Patient was prescribed oral covid treatment paxlovid and treatment note was reviewed. Medication has been received by [place] and reviewed for appropriateness.  1. Drug Interactions or Dosage Adjustments Noted: pt told to hold atorvastatin. Patient's GFR was calculated to be 52, and they were therefore prescribed Reduced dose (GFR 30-60)   Delivery Method: pt pickup  Patient contacted for counseling on 04/02/20  and verbalized understanding.   Delivery or Pick-Up Date: ***   Charm Rings 04/02/2020, [facility] Pharmacist Phone# [phone]

## 2020-04-02 NOTE — Telephone Encounter (Signed)
Attempted to contact pt. Left message to call office back for recommendations.

## 2020-04-02 NOTE — Telephone Encounter (Signed)
Despite her having had vaccine against COVID she is super high risk for deterioration because of her pulmonary fibrosis chronic hypoxemic respiratory failure  Plan -I support a recommendation Lauren to monitor oxygen and if it drops below her baseline to go to the ED for admission   -I do think she needs a urgent referral to the infusion center because she could qualify for antivirals within the first 3 days especially with her being super high risk -this referral needs to be flagged as urgent.  Remdesivir or another antiviral could prevent hospitalization [I am copying Clayborne Dana on this thread]   -Please also asked patient that I could give her a prescription of prednisone -this is not classically indicated unless she flares up but probably might not hurt to give her a course     Immunization History  Administered Date(s) Administered  . Influenza, High Dose Seasonal PF 10/29/2016, 12/12/2019  . Influenza,inj,Quad PF,6+ Mos 11/05/2014  . Influenza-Unspecified 11/28/2013, 11/05/2014, 11/30/2015, 11/15/2018  . PFIZER(Purple Top)SARS-COV-2 Vaccination 03/11/2019, 03/30/2019  . Pneumococcal Polysaccharide-23 02/10/2018  . Tdap 08/22/2016  . Zoster Recombinat (Shingrix) 09/22/2016, 12/22/2016

## 2020-04-03 LAB — NOVEL CORONAVIRUS, NAA: SARS-CoV-2, NAA: DETECTED — AB

## 2020-04-03 LAB — SARS-COV-2, NAA 2 DAY TAT

## 2020-04-03 NOTE — Telephone Encounter (Signed)
Unfortunately this might be a reality that when the larger concentrator is brought in [employer] does not allow for a portable POC as well and they take it away.  If they will accept a letter from me we can do a letter saying that it is important patient has both devices.  Please find out from George of the [facility] [place].  If this does not work patient should probably apply to get a portable system out-of-pocket from [employer]

## 2020-04-03 NOTE — Telephone Encounter (Signed)
MR---pt sent a message in today and stated that [facility] will bring out the larger concentrator but will take her portable POC and replace this with a tank and the pt is concerned about this as she feels that she will not be able to carry a tank with her everywhere that she goes.  Please advise. thanks

## 2020-04-06 ENCOUNTER — Telehealth: Payer: Self-pay | Admitting: *Deleted

## 2020-04-06 NOTE — Telephone Encounter (Signed)
Vinnie Level is retuning phone call. Ashly phone number is [phone].

## 2020-04-06 NOTE — Telephone Encounter (Signed)
Called and spoke with [name]. Relayed the below message to him. He stated that since [facility] bills through insurance, they have to follow the insurance guidelines. [facility] received an order stating that the patient requires 8L of O2 with exertion. The POC she has will only go to 5L. Insurance is aware of this and will no longer pay for the POC since it will not be able to keep up with her O2 needs. She is still renting the POC from them, so they have to follow Medicare's rules.   He stated that he will have the respiratory therapist to call her today to explain this to her. Also stated that at this point, a medical necessity letter will not help her.

## 2020-04-06 NOTE — Telephone Encounter (Signed)
Called and left a message for [employer] with [employer] regarding patient's portable concentrator and stationary concentrator.  Dr. [name] feels she needs the larger stationary concentrator and the portable concentrator.  She was told that when the larger stationary concentrator is available that [facility] will take back her portable concentrator.  Dr. [name] feels she needs both at this time and wants to know if a letter from our office of medical necessity to keep both would help.  I left the main # for him to return my call.  Will await a return call.  Patient advised in the [facility] e-mail that we would keep her updated.

## 2020-04-07 NOTE — Telephone Encounter (Signed)
Called and spoke with patient, advised of response from [place] and Dr. [name] regarding POC.  She verbalized understanding.  She states that going from her home to her car, she does not consider exerting herself and does not require 8 L of oxygen.  She will call [facility] to see about the rental out of pocket and see if they will allow this with the difference in liter flow.  She was appreciative of checking into this matter for her.  Nothing further needed.

## 2020-04-07 NOTE — Telephone Encounter (Signed)
Yes, I figured these are CMS guidelines.   Please explain this to her  She should prob either rent or call [employer] and pay out of pocket for portable one

## 2020-04-13 ENCOUNTER — Telehealth (HOSPITAL_COMMUNITY): Payer: Self-pay | Admitting: Pharmacist

## 2020-04-13 NOTE — Telephone Encounter (Signed)
Please see message below from patient  I am considering purchasing a portable concentrator from [facility].   [facility] will  take my portable away when they  deliver  a larger concentrator.  A representative , [name], of [employer] will contact your office for a prescription.   It is VERY important that Dr. [name] indicate that I do NOT need more than 5 L for mobility of short distances with little exertion.  The new machine only goes to 6L.  I need this machine to keep me active and out of the house. Please let me know if you need more information before I contact the [employer] rep to follow through in getting a prescription.  Thank you  [facility].  06/17/1934

## 2020-04-13 NOTE — Telephone Encounter (Signed)
Sent in [employer] application to [place] for [place].    Application pending, will continue to follow.   Audry Riles, PharmD, BCPS, BCCP, CPP [facility] Pharmacist [phone]

## 2020-04-14 DIAGNOSIS — J849 Interstitial pulmonary disease, unspecified: Secondary | ICD-10-CM | POA: Diagnosis not present

## 2020-04-16 ENCOUNTER — Other Ambulatory Visit: Payer: Self-pay

## 2020-04-16 ENCOUNTER — Telehealth: Payer: Self-pay | Admitting: Internal Medicine

## 2020-04-16 ENCOUNTER — Ambulatory Visit: Payer: Medicare HMO | Admitting: Internal Medicine

## 2020-04-16 ENCOUNTER — Encounter: Payer: Self-pay | Admitting: Internal Medicine

## 2020-04-16 VITALS — BP 118/74 | HR 63 | Temp 97.2°F | Ht 64.0 in | Wt 123.6 lb

## 2020-04-16 DIAGNOSIS — Z7952 Long term (current) use of systemic steroids: Secondary | ICD-10-CM | POA: Diagnosis not present

## 2020-04-16 DIAGNOSIS — I2723 Pulmonary hypertension due to lung diseases and hypoxia: Secondary | ICD-10-CM | POA: Diagnosis not present

## 2020-04-16 DIAGNOSIS — Z8616 Personal history of COVID-19: Secondary | ICD-10-CM

## 2020-04-16 DIAGNOSIS — J9611 Chronic respiratory failure with hypoxia: Secondary | ICD-10-CM

## 2020-04-16 DIAGNOSIS — Z5181 Encounter for therapeutic drug level monitoring: Secondary | ICD-10-CM | POA: Diagnosis not present

## 2020-04-16 DIAGNOSIS — J84112 Idiopathic pulmonary fibrosis: Secondary | ICD-10-CM

## 2020-04-16 NOTE — Telephone Encounter (Signed)
Called and spoke with Cecille Rubin from [place] wanting to clarify that the signature shown for the provider was actually Dr. Golden Pop signature. Stated to her that the signature shown is MR's exact signature and she verbalized understanding. Nothing further needed.

## 2020-04-16 NOTE — Progress Notes (Signed)
IOV 03/21/2017  Chief Complaint  Patient presents with  . Advice Only    Former RA pt. Pt states she has been going to [facility] and is on 3-4L O2 with exertion and sometimes at night. Has SOB mainly with exertion.    [name] 86 y.o. female with interstitial lung disease.  History is gained from talking to her and review of the chart.  She was originally diagnosed with interstitial lung disease by Dr. Kara Mead in our practice.  Subsequently she transferred her care to [facility] Dr. [name] who has been following her.  Review of the chart shows the differential diagnosis is IPF versus chronic hypersensitivity pneumonitis based on some air trapping done in her high-resolution CT scan of the chest in 2017/2018.  She is known to have chronic stable interstitial lung disease on previous imaging dating as back as 2008 but she is only aware of her interstitial lung disease in the recent few years.  There is excellent documentation in Dr. [name] medical notes.  It appears based on the goals of care of the patient and her exertional desaturation that surgical lung biopsy was deferred and anti-fibrotic therapy was deferred.  Patient confirms the same.  She is extremely physically fit and thus good aerobic activity on 4 L of oxygen outpacing people much younger than her.  It appears over many years has been a gradual decline in her fibrosis and pulmonary health but she still extremely functional and does well.  She is mainly here today to have a pulmonary fibrosis resource in [place], [place].  She sees Dr. Lauris Chroman every 4 months.  She prefers to see Dr. Lauris Chroman and do all her monitoring in terms of her 6-minute walk test, pulmonary function test and her high-resolution CT scan at [facility] because she is very comfortable with the personnel there.  Yet because she is local in [place] she wants to tap into the resources here in case of an emergency.  We discussed  about alternating her follow-up between myself and Dr. Lauris Chroman every 4 months which would make her visit to either one place every 8 months and she seemed to think this was a good plan.  At this point as she is stable and she is satisfied with all pillars of our care.  She is not interested in pulmonary rehabilitation because rightfully so she exercises pretty actively.  She has been the patient support group and did not find value in it.  She is not a transplant candidate.  And she is deferred anti-fibrotic therapy.  She might or might not be interested in research trial and this was not discussed.  It appears the only new issue other than her overall stability is that for the last 1 year she has mild random dysphagia although no active acid reflux.  This can happen for liquids or solids or tablets and is very random.  She has not seen a gastroenterologist for this.  She is not on any proton pump inhibitors.  In addition she tells me she uses of feather down pillow all her life.  She is also use talcum powder all her life and with the recent lawsuit against [employer] about talcum powder she wonders if this could be contributory to her pulmonary fibrosis.  Otherwise [employer] of chest physicians interstitial lung disease questionnaire is positive for cigarette smoking from age 71 when she smoked a pack a week to age 42.  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  She did recently try a course of prednisone through Dr. Lauris Chroman for her interstitial lung disease but she did not seem to benefit from this and this is been slowly weaned off.  Her autoimmune antibody at [facility] and in [place] was normal.  Pulmonary function test from [facility] was reviewed and the images of CT scan of the chest done in [place] visualized  Walking desaturation test on 03/21/2017 185 feet x 3 laps on ROOM AIR:  did not desaturate. Rest pulse ox was 100%, final pulse ox was 88%. HR response  71/min at rest to 120/min at peak exertion. Patient [name]  Did yes Desaturate </= 88% . [name] yes  Desaturated </= 3% points. [name] yes did get tachyardic  IMPRESSION: 1. Pulmonary parenchymal pattern of fibrosis is progressive, most indicative of usual interstitial pneumonitis (UIP). 2. Coronary artery calcification.   Electronically Signed   By: Lorin Picket M.D.   On: 10/27/2014 15:51 OV 06/21/2017   Chief Complaint  Patient presents with  . Follow-up    Pt saw Dr. Lauris Chroman 2/4.  Pt states she has been doing well up until 10 days ago when she developed a cold and is coughing, postnasal drainage and some mild chest tightness.   [name] presents for follow-up of interstitial lung disease [IPF most likely versus chronic HP less likely],. Is supportive care. She follows mostly at [facility] with Dr. [name]. I am her local resource. This is a scheduled appointment. Last seen by Dr. Lauris Chroman in February 2019. Notes reviewed and patient also confirms the same. It appears that without low-dose prednisone her cough relapsed. Therefore she went back on 5 mg prednisone and she was doing well in terms of her cough. However 10 days ago she developed a runny nose and cold and since then has increased cough compared to baseline. She also has a nasal twang. This a slight increase in sputum production compared to baseline and the sputum is off color. This is not normal for her. The might be some increased wheezing but his sleep quality is good. Overall effort tolerance continues to be good. She exercises with oxygen. At room air at rest she does not need oxygen. Most recently in February 2019 her ILD was deemed stable by Dr. Lauris Chroman. [place] the [place] days she wants treatment recommendation for the current deterioration. She does not want to do high dose prednisone because of fear of hallucinationsthat have happened in the past. She also now  wants to alternate pulmonary fibrosis follow-up between [facility] and myself.   She also last several months Conway/o loss of taste and asking for directions in resolving this issue      OV 01/23/2018  Subjective:  Patient ID: [name], female , DOB: Sep 09, 1934 , age 89 y.o. , MRN: [id number] , ADDRESS: [place] [id number]   01/23/2018 -   Chief Complaint  Patient presents with  . Follow-up    States her breathing has been at her baseline. Denies chest pain or chest discomfort. States tesslon helps but it is too expensive.      HPI [name] 86 y.o. -returns for follow-up of her interstitial lung disease that has a differential diagnosis between IPF and chronic hypersensitivity pneumonitis.  After seeing me last she saw Dr. Lauris Chroman in August 2019.  Since then she has been doing  stable.  I reviewed those notes.  Compared to recent visits her CT scan is stable and her walk test is stable.  She is able to manage with 2 L oxygen.  She has a portable system.  She does feel that she is unable to do more than she would like to do because of her ILD.  She does admit that compared to 3 years ago she says progressive ILD.  She also has significant cough for which she likes Ladona Ridgel but she says insurance is making him pay $3 a pill and so she is upset about that.  Review of immunization record shows that she has not had Pneumovax or flu shot this year but she says she is up-to-date.  We discussed the new [employer] trial for progressive non-IPF ILD where there was beneficial effect of nintedanib.  We discussed this and she is willing to try this after the new year 2020.  She wants to make sure that her [place] ILD specialist Dr. Lauris Chroman is on board with this.  We discussed the side effects in detail.    OV 03/08/2018  Subjective:  Patient ID: [name], female , DOB: [birth year] , age 43 y.o. , MRN: [id number] , ADDRESS: [place]   [id number]   03/08/2018 -   Chief Complaint  Patient presents with  . Follow-up    Pt states she has been well since last visit. States she does become SOB with exertion and does have an occ cough with clear mucus. Denies any CP.    ilD FOLLOWUP   HPI [facility] 86 y.o. -returns for followup. Over xmas 2019 because of social stress increased prednisone to 6m per day but now back to 567mper day. Dyspnea with exergtion continues but stable. Cough that is very bothersome continue.  She takes TeBest boyor this this helps her.  However it is expensive.  She is placed a call to [employer] apparently they are going to try to work out where she can make it more affordable because I ILD is a diagnosis.  She admitted that she has down pillows and feather blankets.  1 of her close friends Mr. TaLovena Leho also has hypersensitivity pneumonitis and sees me advised her to get rid of those and therefore she is in the process of doing that.  She asked about further options and treatment of a cough.  We discussed gabapentin no increasing steroid dose participating in a cough study but she did not want to add these complications to her.  She seems content with the option of trying an inspiratory muscle trainer and seeing if the cough would get better.  Overall given her progressive ILD and the possibility that this is IPF she is willing to now try nintedanib.  We discussed nintedanib extensively.  She denies any heart disease.  She is not on any major anticoagulation.  She does not have any GI issues other than constipation.  She is willing to try nintedanib.  She is nervous about the co-pay and said if the co-pay is too expensive despite charity program then she will not take it.  She is [facility].          OV 01/17/2019  Subjective:  Patient ID: HoMammie Lorenzofemale , DOB: 4/11-02-1934 age 86.o. , MRN: [id number] [address] [id number]  01/17/2019 -   Chief Complaint  Patient presents with  . Follow-up    Pt states she has been okay since last visit. Pt states SOB has become worse and is coughing a lot.   Interstitial lung disease-IPF [UIP in August 2016 CT scan read locally] versus second distant possibility chronic hypersensitivity pneumonitis [feather pillow exposure and air trapping on CT scan per report]   HPI [name] 86 y.o. -last seen January 2020.  Since then in the last 6 months he has declined significantly.  She did see Dr. Lauris Chroman about a month ago at [facility].  I reviewed the chart.  Her shortness of breath with exertion has declined.  With exercise and walking she is using 4 L of oxygen.  Her cough also got worse.  He has increased the prednisone to 10 mg/day.  This is helped her cough.  Nevertheless she randomly gets severe coughing spells that makes her feel that she is going to die.  She is got a facemask oxygen is going to use that.  There was conversation about increasing the prednisone even further according to her history but she declined.  At this point in time she just wants to monitor the situation.  She certainly is open to taking more prednisone if the cough got worse.  She did have a high-resolution CT scan of the chest December 10, 2018 at [facility].  I reviewed the result.  Images not available for visualization.  They report definite persistent air trapping.  They also report some nodular opacities in the left lung.  Overall worsening fibrosis especially on the left greater than the right.  She did have an echocardiogram.  Her symptom scores currently are listed below and it shows significant level of symptoms.  We discussed antifibrotic nintedanib.  She says she is so frustrated because of the cost.  She says she tried a level best to get the cost down but could not.  She is pretty pessimistic that she will not be able to get a discount on the drug.   We discussed about the fact her disease continues to be progressive and to see if the cost would justify some of the benefit it might give.  She is willing to have me inquire about the cost.  We discussed research as a care option.  We discussed in detail the concept of research being voluntary.  The principles of randomization to placebo.  The need to do study visits.  Inclusion exclusion criteria.  She is willing to go through screening for any protocol for which she might qualify.  This will be possible after January 2020.  She had questions about the Covid vaccine.  She is social distancing.  She is afraid of getting Covid.     OV 04/22/2019  Subjective:  Patient ID: [name], female , DOB: 02/26/1935 , age 76 y.o. , MRN: [id number] , ADDRESS: [place] [id number]   04/22/2019 -   Chief Complaint  Patient presents with  . Follow-up    Pt states she has been doing well since last visit and states her breathing is stable.   Interstitial lung disease ( Dr Lauris Chroman and Dr [name])  -IPF [UIP in August 2016 CT scan read locally] versus   - chronic hypersensitivity pneumonitis [feather pillow exposure and air trapping on CT scan per report]  - started [facility] Oct 2017 - [facility]an  - Dickey Gave Jan 2021  - air purifier  since jan 2021   HPI [name] 86 y.o. -presents with her husband.  She says overall she is doing well.  She is now taking the nintedanib for the last 3 weeks.  She is entering the fourth week of the nintedanib.  She says since she started nintedanib cough is improved all the dyspnea is the same.  She now has air purifier at home.  Review of her symptom score shows improvement in her symptoms.  In terms of her nintedanib tolerance she is tolerating it fine.  She says so far no side effects.  Liver function test March 27, 2019 is normal.  She is interested in research protocols.  On this visit I was able to review her [place] records.  She last saw  Dr. Lauris Chroman in October 2020.  Dr. Lauris Chroman was concerned about her progression.  She did have a high-resolution CT chest that I cannot visualize the image myself because it was done at [facility] the report there is one that is more consistent with an alternative diagnosis to UIP and more consistent with chronic hypersensitivity pneumonitis.  As always a debate between IPF versus chronic hypersensitive pneumonitis in [place] with the weight of the clinical evidence being more towards IPF per [facility] records.  I reviewed our records and so far we have never discussed in our multidisciplinary case conference.  Her walking desaturation test clearly shows deterioration compared to 1 year ago  She also told me a little bit of blurring of vision on and off with prednisone.  She wanted to know safe prednisone dose.  She feels 5 mg of prednisone does not control her symptoms well but 10 mg dose.  Her main new issue - new issue venous stasis RLE.  She has a nonhealing wound ulcer that is over an inch in size.  She is wrapped it with TED stockings and Ace wrap.  I am not able to see the wound.  She sees the wound care here.  She keeps the leg elevated intermittently during the daytime per their advice.  She is frustrated by this.   HRCT oct 2020 [facility]  Impression: 1. Slightly increased left greater than right lung pulmonary fibrosis favored to be due to chronic hypersensitivity pneumonitis. Increased superimposed nodular opacities in the left lung are favored to be related to worsening fibrosis with superimposed infection/malignancy not excluded, attention on follow-up.  Electronically Reviewed by:  Verdell Carmine, MD, [facility] Electronically Reviewed on:  12/10/2018 12:00 PM  I have reviewed the images and concur with the above findings.  Electronically Signed by:  Tessa Lerner, MDPhD,[facility] Electronically Signed on:  12/10/2018 5:39 PM   Echo [facility] oct 2020 INTERPRETATION  --------------------------------------------------------------- NORMAL LEFT VENTRICULAR SYSTOLIC FUNCTION WITH MILD LVH NORMAL RIGHT VENTRICULAR SYSTOLIC FUNCTION VALVULAR REGURGITATION: TRIVIAL MR, TRIVIAL PR, MILD TR NO VALVULAR STENOSIS MILD TO MODERATE TR NO PRIOR STUDY FOR COMPARISON   OV 08/02/2019 - telephone visit. Identified with 2PHI. Called patient on her cell phone 11:58 AM   Subjective:  Patient ID: [name], female , DOB: 1934-07-23 , age 77 y.o. , MRN: [id number] , ADDRESS: [place] [id number]  Interstitial lung disease ( Dr Lauris Chroman and Dr [name])  -IPF [UIP in August 2016 CT scan read locally - [facility] - [facility] at [facility] read in 2011 and 2016 the odds of the scan being more consistent with hypersensitive pneumonitis is low.  The odds are  this is UIP/IPF.  However October 2020 scans from [facility] would be most helpful.  Regardless there is progression and it is appropriate patients on antifibrotic therapy as she is right now] versus   - chronic hypersensitivity pneumonitis [feather pillow exposure and air trapping on CT scan per report]  - started steroids Oct 2017 - [facility] Dr Lauris Chroman  - Dickey Gave Jan 2021 - stopped full dose may 2021  - air purifier since jan 2021   08/02/2019 -     HPI [name] 86 y.o. - says she had a  Wound on right lower extremity following a bump in dec 2020. Did not heal. Jeris Penta to wound center for 3 months. Discharged a week ago from followup., DUring that time her RLE was wrapped in compression bandages. But now she is not on that. And since then ankles are swollen. Now she is on "Suppose" which helps. But as she removes it - > leg swelling gets worse. Swelling is bilateral but RLE > LLE.  Wnts to know iof ILD is causing this. Answered that is possible via Cable mechanism. Reviewed echo from oct 2020- > this did not show RV strain. Currently per her hx - does not sound like cellulitis or DVT (all  chronic and unchanged). Nto currently interested in lasix.   Overall, sicne jan 2021 says ILD is worse and using more o2. Ngiht usingi 4L [place] at night and rest. Sometimes needs 5L  with exertion. When she goes to groceries - > takes portable o2 concentrator  Now on ofev 150 mg tablet - taking it once a day since 07/14/19. She is not sur if she can take bid / once daily alternate day as yet. Wants this lower regime for 1 month and then try esclating  Also down to 54m prednisone Continue on tessalon perles  Doing ok on it  Oct 2020> [facility] is not uploaded in our PACT    ROS  -  OV 09/23/2019   Subjective:  Patient ID: HMammie Conway female , DOB: [birth year] age 86y.o. years. , MRN: [id number]  ADDRESS: [address] [address]  GLavone Orn MD Providers : Treatment Team:  Attending Provider: RBrand Males MD   Interstitial lung disease ( Dr GLauris Chromanand Dr RChase Caller  -IPF [UIP in August 2016 CT scan read locally - [facility] the odds of the scan being more consistent with hypersensitive pneumonitis is low.  The odds are this is UIP/IPF.  [facility]  Regardless there is progression and it is appropriate patients on antifibrotic therapy as she is right now] versus   - chronic hypersensitivity pneumonitis [feather pillow exposure and air trapping on CT scan per report]  - started [facility] Oct 2017 - [facility] Dr GLauris Chroman - oDickey GaveJan 2021 - stopped full dose may 2021 -> since then 1532monce daily  - air purifier since jan 2021     09/23/2019 -returns for follow-up.  At this visit she tells me that her last [facility].  He is now no longer seeing ILD patients or any patients and therefore she would just will keep a follow-up with me.  She is taking prednisone 10  mg/day although splitting it is 5 mg twice daily.  She says this gives her more energy.  She says she is using 4-5 L at rest and continuously although today when we walked at room air at rest she was fine and she only dropped to 88% after walking 2 laps and went down to 84% after walking 3 laps.  The distance is shown below.  She wants to qualify for at least 10 L oxygen.  I have advised her she may need to come for oxygen titration test.  In terms of her nintedanib she is taking 150 mg once daily.  She tried to go to an average of 200 mg total for the day by taking 150 mg twice daily Monday Wednesday Friday and then taking 150 mg once daily on Tuesday Thursday and Saturday and rotating between the 2 on Sunday.  However she says discussed lot of diarrhea and she stopped it.  Her weight appears stable.  She wants to see if she can escalate to a slightly higher dose on the nintedanib because I have advised her that 150 mg once daily is subtherapeutic.  She does have some foot numbness problems.  I have suggested she talk this with the primary care physician.  Her symptom score and PFT shows a steady progressive decline in her ILD.  Her symptom score shows continued worsening.    OV 12/26/2019   Subjective:  Patient ID: [name], female , DOB: 28-Nov-1934, age 39 y.o. years. , MRN: [id number],  ADDRESS: [place] [address] PCP  [name], MD Providers : Treatment Team:  Attending Provider: Brand Males, MD Patient Care Team: [name], MD as PCP - General (Internal Medicine)    Interstitial lung disease ( Dr Lauris Chroman and Dr [name])  -IPF [UIP in August 2016 CT scan read locally - [facility] 2021 [facility] - [facility] 2021 [facility] at [facility]: [facility] read in 2011 and 2016 the odds of the scan being more consistent with hypersensitive pneumonitis is low.  The odds are this is UIP/IPF.  However October 2020 scans from [facility] would be most helpful.   Regardless there is progression and it is appropriate patients on antifibrotic therapy as she is right now] versus   - chronic hypersensitivity pneumonitis [feather pillow exposure and air trapping on CT scan per report]  - started steroids Oct 2017 - [facility] Dr Lauris Chroman  - Dickey Gave Jan 2021 - stopped full dose may 2021 -> since then 136m once daily -> on 1033mtwice daily by Aug 2021  - air purifier since jan 2021  - [facility] MDD: UIP/IPF - no air trapping on Oct 2020 and Oct 2021 CT -> dx is IPF bsed on April 2021 conference and more formally infored to patient 12/26/19   Left diaphragm elevation-chronic and contributing to asymmetry of basal honeycombing  Chief Complaint  Patient presents with  . Follow-up    Pt states she has been doing okay since last visit. States that she is tolerating the OFEV so far. Pt does still have SOB but states her cough is better.       [facility] -returns for follow-up with her husband.  At this visit multiple issues to discuss.  At this point in time in terms of therapy she is on nintedanib.  She is able to do 100 mg twice daily since August 2021.  She is not having much side effects.  In terms of symptoms on the symptom score she  seems stable but she tells me that she is actually doing less and less she is not able to exercise anymore because of hypoxemia.  She uses oxygen 75% of the time.  She uses 4-5 L at night while sleeping and 4-5 L with portable system when exerting.  She walks of the driveway and she would desaturate to less than 88%.  She states she is restricted her exertion but she still does go around to the grocery store with the oxygen.  Her weight is stable on nintedanib at 126 pounds.  Today she will have liver function tests  In terms of her disease severity: She had pulmonary function test we reviewed this that shows steady progression over time.  She also had high-resolution CT chest that is definitely progressed compared to last  year.  Again our radiologist feels quite confidently that the pattern is definitely for UIP.  There is no suggestion of alternative diagnosis such as hypersensitive pneumonitis.  I went over this with the patient and said that we would formally classify her as IPF.  Because of this diagnosis we then discussed the prednisone intake which she understands was started for possibility of hypersensitive pneumonitis.  Explained to her that our radiologist are not seeing air trapping.  She does say that the prednisone does make her feel better and when she tapers that she could feel worse.  Now that she is willing to try to reduce the prednisone based on data that in IPF chronic prednisone could potentially be harmful but overall she says that she has tolerated prednisone quite well.  We went over the general side effects of prednisone including bone health.  She is worried about her chronic pedal edema.  This is getting worse.  Foot elevation is not helping.  She says compression stockings can be very painful for her.  In the past she was on diuretic therapy.  She is willing to restart this.  She does not understand the pathologic physiologic basis for this.  We went over pulmonary hypertension, venous stasis skin bruising aging and venous incompetency.  We discussed the possibility of modulating pulmonary artery pressures with the help of inhaled nitric oxide as a trial inhaled treprostinil as standard of care or as a trial.  This will be dependent on cardiac data.  I informed her that we will discuss this more in the future as we get more information  She is also worried about her recent CT findings that shows aortic valve calcifications and coronary artery calcifications.  She does not have chest pain but she says this description is new to her.  Her last echocardiogram was over a year ago at [facility].  I reviewed the records could not find it.  She does not have any chest pain she is not seen a cardiologist.   She never had a stress test.  She is willing to go through work-up for this.       Results for MARTESHA, NIEDERMEIER (MRN [id number]) as of 01/23/2018 17:05  Ref. Range 10/28/2014 16:39 [facility]2017 [facility]  FVC-Pre Latest Units: L 2.19 1.85 1.64 1.58L 1.45  FVC-%Pred-Pre Latest Units: % 84 69% 66  60%   Results for LAYANN, BLUETT (MRN [id number]) as of 01/23/2018 17:05  Ref. Range 10/28/2014 16:39 01/23/2018  12/10/2018 4.4/21 12/16/19 - GLI equation  DLCO unc Latest Units: ml/min/mmHg 12.16 8.36 5.68 6.6 7.33  DLCO unc % pred Latest Units: % 52 51% 30  40%     CLINICAL DATA:  86 year old female with history of interstitial lung disease. Worsening oxygenation and shortness of breath since February.  CT CHEST 12/19/19  EXAM: CT CHEST WITHOUT CONTRAST  TECHNIQUE: Multidetector CT imaging of the chest was performed following the standard protocol without intravenous contrast. High resolution imaging of the lungs, as well as inspiratory and expiratory imaging, was performed.  COMPARISON:  Chest CT 12/10/2018.  FINDINGS: Cardiovascular: Heart size is normal. There is no significant pericardial fluid, thickening or pericardial calcification. There is aortic atherosclerosis, as well as atherosclerosis of the great vessels of the mediastinum and the coronary arteries, including calcified atherosclerotic plaque in the left main, left anterior descending, left circumflex and right coronary arteries. Calcifications of the aortic valve.  Mediastinum/Nodes: No pathologically enlarged mediastinal or hilar lymph nodes. Please note that accurate exclusion of hilar adenopathy is limited on noncontrast CT scans. Esophagus is unremarkable in appearance. No axillary lymphadenopathy.  Lungs/Pleura: High-resolution images demonstrate widespread areas of septal thickening, subpleural reticulation, traction bronchiectasis, parenchymal banding and extensive  honeycombing. Findings are asymmetric (left greater than right). In the right lung, there is a definitive craniocaudal gradient. Overall, findings are progressive compared to prior study from 2020. No acute consolidative airspace disease. No pleural effusions. No definite suspicious appearing pulmonary nodules or masses are noted.  Upper Abdomen: Elevation of the left hemidiaphragm. Aortic atherosclerosis.  Musculoskeletal: There are no aggressive appearing lytic or blastic lesions noted in the visualized portions of the skeleton.  IMPRESSION: 1. Progressive worsening interstitial lung disease, with a spectrum of findings considered diagnostic of usual interstitial pneumonia (UIP) per current ATS guidelines. The asymmetry of the findings (left greater than right) may be related to chronic left hemidiaphragm paralysis. 2. Aortic atherosclerosis, in addition to left main and 3 vessel coronary artery disease. 3. There are calcifications of the aortic valve. Echocardiographic correlation for evaluation of potential valvular dysfunction may be warranted if clinically indicated.  Aortic Atherosclerosis (ICD10-I70.0).   Electronically Signed   By: Vinnie Langton M.D.   On: 12/20/2019 06:58   OV 04/16/2020  Subjective:  Patient ID: [name], female , DOB: 1934/08/19 , age 57 y.o. , MRN: [id number] , ADDRESS: [place] [address] PCP [name], MD Patient Care Team: [name], MD as PCP - General (Internal Medicine) Fay Records, MD as PCP - Cardiology (Cardiology)  This Provider for this visit: Treatment Team:  Attending Provider: Brand Males, MD    04/16/2020 -   Chief Complaint  Patient presents with  . Follow-up    Had covid 3 weeks ago     Interstitial lung disease ( Dr Lauris Chroman and Dr [name])  -IPF [UIP in August 2016 CT scan read locally - April 2021 MDD at [facility] - April 2021 [facility] at [facility]: [facility] scans at [facility]  read in 2011 and 2016 the odds of the scan being more consistent with hypersensitive pneumonitis is low.  The odds are this is UIP/IPF.  However October 2020 scans from [facility] would be most helpful.  Regardless there is progression and it is appropriate patients on antifibrotic therapy as she is right now] versus   - chronic hypersensitivity pneumonitis [feather pillow exposure and air trapping on CT scan per report]  - started steroids Oct 2017 - [facility] Dr Lauris Chroman  - [facility]ic[facility] Gave Jan 2021 - stopped full dose may 2021 -> since then 159m once daily -> on 1049mtwice daily by Aug 2021  -  air purifier since jan 2021  - [facility] MDD: UIP/IPF - no air trapping on Oct 2020 and Oct 2021 CT -> dx is IPF bsed on April 2021 conference and more formally infored to patient 12/26/19   Left diaphragm elevation-chronic and contributing to asymmetry of basal honeycombing  WHO group 3 Pulm Htn 02/13/20  Right Heart Pressures [facility] Procedural Findings: Hemodynamics (mmHg) RA mean 3 RV 45/1 PA 50/16, mean 27 PCWP mean 4 LV 133/10 AO 130/65  Oxygen saturations: PA 70% AO 100%  Cardiac Output (Fick) 3.74  Cardiac Index (Fick) 2.35 PVR 6.1 WU    Hx covid  - opd Rx Feb 2022  HPI [place] 86 y.o. -returns for follow-up.  Since last visit in December 2020 when she had right heart catheterization and has significant WHO group 3 pulmonary hypertension.  Inhaled treprostinil has been recommended.  She is getting training for it next week and going to start it.  Then in early February 2022 she had COVID-19.  We called in and got her antivirals.  She is feeling much better.  She has some residual fatigue and cough.  However she does not want to do prednisone.  She is 91% on room air at rest but with easy exertion she drops.  In fact on a 6-minute walk test it is deemed that she requires 8 L of oxygen.  She will not qualify for portable system anymore.  Therefore she is going to get this on her own.  For simple  walk 5 L is good enough but for heavy walk she needs 8 L.  So she is going to pay out-of-pocket for the portable [employer] system.  We discussed support group.  She finds attending it is the motivating.  She got support from 2 of her friends Joseph Berkshire and Gar Gibbon.  Both of them are now deceased .  She is sad about it.  She think she will attend the upcoming fibrosis support group meeting via video.  She is tolerating low-dose nintedanib fine.  She is on prednisone at 10 mg.  She is going to start treprostinil next week.  She think she will attend the support group by video and she has a video invitation for that.      SYMPTOM SCALE - ILD Nov 2020 04/22/2019 - ofev since jan 2021  09/23/2019 125# ofev 143m daily 12/26/2019 126# ofev 1022mtwice daily 04/16/2020 123#  O2 use 4L with ex Portable with exertion  4-5 L at night and 4-5L with exertion, uses o2 75% of the time, 95% RA at rest 91% RA at rest  Shortness of Breath  0 -> 5 scale with 5 being worst (score 6 If unable to do)     At rest 2 0 _0 Simple tasks - showers, clothes change, eating, shaving _1 Household (dishes, doing bed, laundry) 4 3._2 Shopping _3 Walking level at own pace _4 Walking up Stairs _5 Total (30-36) Dyspnea Score 19 11._6 How bad is your cough? 4 2.5 improved with ofev 2.5 better 2  How bad is your fatigue _7 ok 1  How bad is nausea  0 0 0 0  How bad is vomiting?   0 0 0 0  How bad is diarrhea?  0 2 0 0  How bad is  anxiety?  1 0 0 0  How bad is depression  0 0 0 0     Simple office walk 250 feet x  3 laps goal with forehead probe 03/08/2018  04/22/2019  09/23/2019  12/26/2019   O2 used Room air Room air ra   Number laps completed 3 Only did 2 laps Did 2 laps and desaturated at end of 2nd lap but kept walking to 3.5laps   Comments about pace brisk Mod pace Mod pace   Resting Pulse Ox/HR 99% and 85/min 99% and 86/min 998% and HR 80/min   Final  Pulse Ox/HR 90% and 124/min 87% and 119/min 88% and HR 112/min @ end of 2nd laps -> 84% at 3.5 laps without staopping / HR 116/min   Desaturated </= 88% no yes    Desaturated <= 3% points yes yues    Got Tachycardic >/= 90/min yes yes    Symptoms at end of test Moderate dyspnea Mod dyspnea Mo dyspnea   Miscellaneous comments x woprse       PFT  PFT Results Latest Ref Rng & Units 12/16/2019 10/28/2014  FVC-Pre L 1.45 2.19  FVC-Predicted Pre % 60 84  FVC-Post L - 2.18  FVC-Predicted Post % - 84  Pre FEV1/FVC % % 89 90  Post FEV1/FCV % % - 90  FEV1-Pre L 1.30 1.97  FEV1-Predicted Pre % 73 102  FEV1-Post L - 1.96  DLCO uncorrected ml/min/mmHg 7.33 12.16  DLCO UNC% % 40 52  DLCO corrected ml/min/mmHg 7.33 -  DLCO COR %Predicted % 40 -  DLVA Predicted % 70 80  TLC L - 4.11  TLC % Predicted % - 83  RV % Predicted % - 83       has a past medical history of Hemorrhoids and IPF (idiopathic pulmonary fibrosis) ([place]).   reports that she quit smoking about 33 years ago. Her smoking use included cigarettes. She has a 7.50 pack-year smoking history. She has never used smokeless tobacco.  Past Surgical History:  Procedure Laterality Date  . BREAST EXCISIONAL BIOPSY Left    x 2  . RIGHT/LEFT HEART CATH AND CORONARY ANGIOGRAPHY N/A 02/13/2020   Procedure: RIGHT/LEFT HEART CATH AND CORONARY ANGIOGRAPHY;  Surgeon: Larey Dresser, MD;  Location: [facility];  Service: Cardiovascular;  Laterality: N/A;  . TONSILLECTOMY     age 30    Allergies  Allergen Reactions  . 5-Alpha Reductase Inhibitors   . Prednisone     Hallucination when given in high doses     Immunization History  Administered Date(s) Administered  . Influenza Split 12/01/2009, 11/29/2010, 11/17/2014, 11/30/2015, 11/28/2016  . Influenza, High Dose Seasonal PF 10/29/2016, 11/15/2018, 12/12/2019  . Influenza,inj,Quad PF,6+ Mos 11/05/2014  . Influenza-Unspecified 11/28/2013, 11/05/2014, 11/30/2015, 11/15/2018  .  PFIZER(Purple Top)SARS-COV-2 Vaccination 03/11/2019, 03/30/2019, 10/25/2019  . Pneumococcal Conjugate-13 08/08/2013  . Pneumococcal Polysaccharide-23 03/28/2005, 02/10/2018, 09/07/2018  . Td 03/30/2007  . Tdap 08/22/2016  . Zoster 02/28/2006, 09/22/2016, 12/22/2016  . Zoster Recombinat (Shingrix) 09/22/2016, 12/22/2016    Family History  Problem Relation Age of Onset  . Stroke Father   . Lung cancer Brother        mets     Current Outpatient Medications:  .  aspirin 81 MG EC tablet, Take 81 mg by mouth daily. , Disp: , Rfl:  .  atorvastatin (LIPITOR) 40 MG tablet, Take 1 tablet (40 mg total) by mouth daily., Disp: 30 tablet, Rfl: 6 .  benzonatate (TESSALON) 200 MG capsule,  Take 200 mg by mouth 2 (two) times daily as needed for cough., Disp: , Rfl:  .  Calcium Carbonate+Vitamin D 600-200 MG-UNIT TABS, Take 1 tablet by mouth daily., Disp: , Rfl:  .  furosemide (LASIX) 20 MG tablet, Take 20 mg by mouth daily as needed., Disp: , Rfl:  .  Nintedanib (OFEV) 100 MG CAPS, Take 1 capsule (100 mg total) by mouth 2 (two) times daily., Disp: 60 capsule, Rfl: 11 .  potassium chloride (MICRO-K) 10 MEQ CR capsule, Take 10 mEq by mouth daily as needed., Disp: , Rfl:  .  predniSONE (DELTASONE) 5 MG tablet, TAKE 1 TABLET (5 MG TOTAL) BY MOUTH 2 (TWO) TIMES DAILY WITH A MEAL., Disp: 60 tablet, Rfl: 1 .  Treprostinil (TYVASO) 0.6 MG/ML SOLN, Inhale 18 mcg into the lungs daily., Disp: , Rfl:       Objective:   Vitals:   04/16/20 1416  BP: 118/74  Pulse: 63  Temp: (!) 97.2 F (36.2 Conway)  TempSrc: Oral  SpO2: (!) 87%  Weight: 123 lb 9.6 oz (56.1 kg)  Height: _0  (1.626 m)    Estimated body mass index is 21.22 kg/m as calculated from the following:   Height as of this encounter: _1  (1.626 m).   Weight as of this encounter: 123 lb 9.6 oz (56.1 kg).  _2 @  Filed Weights   04/16/20 1416  Weight: 123 lb 9.6 oz (56.1 kg)     Physical Exam  General: No distress.   Cushingoid Neuro: Alert and Oriented x 3. GCS 15. Speech normal Psych: Pleasant Resp:  Barrel Chest -no.  Wheeze -no, Crackles -yes especially at baseline.  Rarely coughs, No overt respiratory distress CVS: Normal heart sounds. Murmurs -no Ext: Stigmata of Connective Tissue Disease -no HEENT: Normal upper airway. PEERL +. No post nasal drip        Assessment:       ICD-10-CM   1. Chronic respiratory failure with hypoxia (HCC)  J96.11   2. IPF (idiopathic pulmonary fibrosis) ([place])  J84.112   3. Therapeutic drug monitoring  Z51.81   4. Current chronic use of systemic steroids  Z79.52   5. WHO group 3 pulmonary arterial hypertension (HCC)  I27.23   6. History of 2019 novel coronavirus disease (COVID-19)  Z86.16        Plan:     Patient Instructions     ICD-10-CM   1. Chronic respiratory failure with hypoxia (HCC)  J96.11   2. IPF (idiopathic pulmonary fibrosis) ([place])  J84.112   3. Therapeutic drug monitoring  Z51.81   4. Current chronic use of systemic steroids  Z79.52   5. WHO group 3 pulmonary arterial hypertension (HCC)  I27.23   6. History of 2019 novel coronavirus disease (COVID-19)  Z86.16     Clinically stable without a flareup although as we know disease is slowly progressive over time Since last visit new diagnosis includes  -  WHO group 3 pulmonary hypertension confirmed on right heart catheterization and awaiting treprostinil   - also COVID-19 in February 2022 for which she was successfully treated with antiviral  Some residual cough and fatigue is probably because of Covid  Plan -Check liver function test today -Continue nintedanib at current dosage -Continue prednisone at 10 mg/day -Use oxygen 8 L with heavy exertion and 5 L with regular exertion -Room air at rest pulse ox is adequate -I will await [employer] form request -Await treprostinil start next week -Try to attend support group meeting  on 04/20/2020 via Zoom video on the topic of mental health during  pandemic -Hold off clinical trials as a care option at the moment because of recent Covid and also eligibility criteria for some studies and also we need to ensure treprostinil kicks in fully  Follow-up  -2-3 months with Dr. [name] for a 30-minute visit or sooner if needed   ( Level 05 visit: Estb 40-54 min   in  visit type: on-site physical face to visit  in total care time and counseling or/and coordination of care by this undersigned MD - Dr Brand Males. This includes one or more of the following on this same day 04/16/2020: pre-charting, chart review, note writing, documentation discussion of test results, diagnostic or treatment recommendations, prognosis, risks and benefits of management options, instructions, education, compliance or risk-factor reduction. It excludes time spent by the [place] or office staff in the care of the patient. Actual time 40 min)   SIGNATURE    Dr. Brand Males, M.D., [email or web address].P,  Pulmonary and Critical Care Medicine Staff Physician, [place] Director - Interstitial Lung Disease  Program  Pulmonary [place] [facility] [place], [place], [address]  Pager: [phone], If no answer or between  15:00h - 7:00h: call [phone] Telephone: [phone]  3:16 PM 04/16/2020

## 2020-04-16 NOTE — Patient Instructions (Addendum)
ICD-10-CM   1. Chronic respiratory failure with hypoxia (HCC)  J96.11   2. IPF (idiopathic pulmonary fibrosis) ([place])  J84.112   3. Therapeutic drug monitoring  Z51.81   4. Current chronic use of systemic steroids  Z79.52   5. WHO group 3 pulmonary arterial hypertension (HCC)  I27.23   6. History of 2019 novel coronavirus disease (COVID-19)  Z86.16     Clinically stable without a flareup although as we know disease is slowly progressive over time Since last visit new diagnosis includes  -  WHO group 3 pulmonary hypertension confirmed on right heart catheterization and awaiting treprostinil   - also COVID-19 in February 2022 for which she was successfully treated with antiviral  Some residual cough and fatigue is probably because of Covid  Plan -Check liver function test today -Continue nintedanib at current dosage -Continue prednisone at 10 mg/day -Use oxygen 8 L with heavy exertion and 5 L with regular exertion -Room air at rest pulse ox is adequate -I will await innogen form request -Await treprostinil start next week -Try to attend support group meeting on 04/20/2020 via Zoom video on the topic of mental health during pandemic -Hold off clinical trials as a care option at the moment because of recent Covid and also eligibility criteria for some studies and also we need to ensure treprostinil kicks in fully  Follow-up  -2-3 months with Dr. [name] for a 30-minute visit or sooner if needed

## 2020-04-17 NOTE — Telephone Encounter (Signed)
[name].

## 2020-04-21 DIAGNOSIS — J849 Interstitial pulmonary disease, unspecified: Secondary | ICD-10-CM | POA: Diagnosis not present

## 2020-04-22 ENCOUNTER — Telehealth (HOSPITAL_COMMUNITY): Payer: Self-pay | Admitting: *Deleted

## 2020-04-22 DIAGNOSIS — L97911 Non-pressure chronic ulcer of unspecified part of right lower leg limited to breakdown of skin: Secondary | ICD-10-CM | POA: Diagnosis not present

## 2020-04-22 DIAGNOSIS — Z85828 Personal history of other malignant neoplasm of skin: Secondary | ICD-10-CM | POA: Diagnosis not present

## 2020-04-22 NOTE — Telephone Encounter (Signed)
OK.  Would let her know that in terms of treatment for ILD-related pulmonary hypertension, that is the only approved option.  Would suggest she try it for a month to see if it helps her, and if not stop it.

## 2020-04-22 NOTE — Telephone Encounter (Signed)
Per [facility]: Pt  Does not wish to start Tyvaso. Pt was shipped medication read the information about it and then canceled the RN coming to her home "stating she read all material and did not wish to take a medication 4x daily" We need direction to discontinue the Tyvaso   Please advise

## 2020-04-23 NOTE — Telephone Encounter (Signed)
OK, it is her decision to make.  If she changes her mind, can let her try it.

## 2020-04-23 NOTE — Telephone Encounter (Signed)
Patient advised and verbalized understanding. However patient reports that she feels as if taking Tyvaso 4 times a day would interfere with her quality of life and she doesn't want to have to deal with it at all. She also states that she is more than happy to discuss this with you in person or via telephone but she has made this decision for herself

## 2020-04-24 NOTE — Telephone Encounter (Signed)
Received the following message from patient:   "Dr. [name], I have chosen to not take Tavasco.  My quality of life has diminished over the past year due to my condition. It is important to me and my family to enjoy every day.  After becoming more familiar with Tyvasco,  I would dread every day having to take the medication 4 times a day.  It might help but I would not want to think that was facing me every day.  That destroys quality in one's life, I Brithney you understand why I chose not to take the medication . I truly appreciate your concern and that of Dr. Aundra Dubin . Thank you,   [facility]"  Will route to MR so he is aware.

## 2020-04-25 NOTE — Telephone Encounter (Signed)
The tyvaso is intended to help her quality of life but Respect the decision. I understand

## 2020-04-28 NOTE — Telephone Encounter (Signed)
Patient was approved to receive Tyvaso through [place], PharmD, BCPS, BCCP, CPP Heart Failure Clinic Pharmacist [phone]

## 2020-04-29 ENCOUNTER — Telehealth: Payer: Self-pay | Admitting: Internal Medicine

## 2020-04-29 NOTE — Telephone Encounter (Signed)
I have called the pt and she stated that she did get a POC from [employer] and she is not happy with them.  She checked with [facility] and is going to see if she likes their POC better.  Order will be faxed to them per the pts request.

## 2020-05-07 ENCOUNTER — Other Ambulatory Visit (INDEPENDENT_AMBULATORY_CARE_PROVIDER_SITE_OTHER): Payer: Medicare HMO

## 2020-05-07 DIAGNOSIS — Z5181 Encounter for therapeutic drug level monitoring: Secondary | ICD-10-CM

## 2020-05-07 DIAGNOSIS — J84112 Idiopathic pulmonary fibrosis: Secondary | ICD-10-CM

## 2020-05-07 LAB — HEPATIC FUNCTION PANEL
ALT: 21 U/L (ref 0–35)
AST: 22 U/L (ref 0–37)
Albumin: 4 g/dL (ref 3.5–5.2)
Alkaline Phosphatase: 61 U/L (ref 39–117)
Bilirubin, Direct: 0.1 mg/dL (ref 0.0–0.3)
Total Bilirubin: 0.8 mg/dL (ref 0.2–1.2)
Total Protein: 7.4 g/dL (ref 6.0–8.3)

## 2020-05-08 ENCOUNTER — Ambulatory Visit (HOSPITAL_COMMUNITY)
Admission: RE | Admit: 2020-05-08 | Discharge: 2020-05-08 | Disposition: A | Discharge status: Home / Self Care | Payer: Medicare HMO | Source: Ambulatory Visit | Attending: Cardiology | Admitting: Cardiology

## 2020-05-08 ENCOUNTER — Encounter (HOSPITAL_COMMUNITY): Payer: Self-pay | Admitting: Cardiology

## 2020-05-08 ENCOUNTER — Other Ambulatory Visit: Payer: Self-pay

## 2020-05-08 VITALS — BP 124/78 | HR 86 | Wt 123.0 lb

## 2020-05-08 DIAGNOSIS — I272 Pulmonary hypertension, unspecified: Secondary | ICD-10-CM

## 2020-05-08 DIAGNOSIS — Z8616 Personal history of COVID-19: Secondary | ICD-10-CM | POA: Diagnosis not present

## 2020-05-08 DIAGNOSIS — I2721 Secondary pulmonary arterial hypertension: Secondary | ICD-10-CM | POA: Insufficient documentation

## 2020-05-08 DIAGNOSIS — J849 Interstitial pulmonary disease, unspecified: Secondary | ICD-10-CM | POA: Diagnosis not present

## 2020-05-08 DIAGNOSIS — Z7952 Long term (current) use of systemic steroids: Secondary | ICD-10-CM | POA: Insufficient documentation

## 2020-05-08 DIAGNOSIS — E785 Hyperlipidemia, unspecified: Secondary | ICD-10-CM

## 2020-05-08 DIAGNOSIS — I517 Cardiomegaly: Secondary | ICD-10-CM | POA: Insufficient documentation

## 2020-05-08 DIAGNOSIS — Z801 Family history of malignant neoplasm of trachea, bronchus and lung: Secondary | ICD-10-CM | POA: Insufficient documentation

## 2020-05-08 DIAGNOSIS — Z7982 Long term (current) use of aspirin: Secondary | ICD-10-CM | POA: Insufficient documentation

## 2020-05-08 DIAGNOSIS — Z79899 Other long term (current) drug therapy: Secondary | ICD-10-CM | POA: Diagnosis not present

## 2020-05-08 DIAGNOSIS — I2510 Atherosclerotic heart disease of native coronary artery without angina pectoris: Secondary | ICD-10-CM | POA: Insufficient documentation

## 2020-05-08 DIAGNOSIS — Z9981 Dependence on supplemental oxygen: Secondary | ICD-10-CM | POA: Diagnosis not present

## 2020-05-08 DIAGNOSIS — Z823 Family history of stroke: Secondary | ICD-10-CM | POA: Insufficient documentation

## 2020-05-08 HISTORY — DX: Heart failure, unspecified: I50.9

## 2020-05-08 LAB — LIPID PANEL
Cholesterol: 240 mg/dL — ABNORMAL HIGH (ref 0–200)
HDL: 127 mg/dL (ref >40)
LDL Cholesterol: 94 mg/dL (ref 0–99)
Total CHOL/HDL Ratio: 1.9 RATIO
Triglycerides: 97 mg/dL (ref <150)
VLDL: 19 mg/dL (ref 0–40)

## 2020-05-08 NOTE — Patient Instructions (Addendum)
Labs done today. We will contact you only if your labs are abnormal.  No medication changes were made. Please continue all current medications as prescribed.  Please contact our office if you want to start the Tyvaso  Your physician recommends that you schedule a follow-up appointment in: 4 months. Please contact our office in June to schedule a July appointment.  If you have any questions or concerns before your next appointment please send [place] a message through [place] or call our office at [phone].    TO LEAVE A MESSAGE FOR THE NURSE SELECT OPTION 2, PLEASE LEAVE A MESSAGE INCLUDING: . YOUR NAME . DATE OF BIRTH . CALL BACK NUMBER . REASON FOR CALL**this is important as we prioritize the call backs  YOU WILL RECEIVE A CALL BACK THE SAME DAY AS LONG AS YOU CALL BEFORE 4:00 PM   Do the following things EVERYDAY: 1) Weigh yourself in the morning before breakfast. Write it down and keep it in a log. 2) Take your medicines as prescribed 3) Eat low salt foods--Limit salt (sodium) to 2000 mg per day.  4) Stay as active as you can everyday 5) Limit all fluids for the day to less than 2 liters   At the [facility], you and your health needs are our priority. As part of our continuing mission to provide you with exceptional heart care, we have created designated Provider Care Teams. These Care Teams include your primary Cardiologist (physician) and Advanced Practice Providers (APPs- Physician Assistants and Nurse Practitioners) who all work together to provide you with the care you need, when you need it.   You may see any of the following providers on your designated Care Team at your next follow up: [name] Dr Glori Bickers . Dr Loralie Champagne . Darrick Grinder, NP . Lyda Jester, PA . Audry Riles, PharmD   Please be sure to bring in all your medications bottles to every appointment.

## 2020-05-10 NOTE — Progress Notes (Signed)
PCP: [name], MD Pulmonary: Dr. [name] HF Cardiology: Dr. Aundra Dubin  86 y.o. with history of interstitial lung disease and pulmonary hypertension by echo was referred by Dr. [name] for evaluation of pulmonary hypertension.  Patient has the the diagnosis of ILD for several years now.  She is on home oxygen 5 L at rest.  She has started on Ofev in 1/21, and she is also on low dose prednisone.  Echo in 11/21 showed mild RV enlargement and mildly decreased RV systolic function.  PASP was estimated at 97 mmHg.  She had RHC/LHC in 12/21, showing mild CAD and mild-moderate pulmonary arterial hypertension with elevated PVR.    She had COVID-19 in 2/22, has recovered well.   She is stable symptomatically.   Using home oxygen.  No dyspnea walking on flat ground.  Short of breath with moderate exertion.  No chest pain.  No lightheadedness.   6 min walk (1/22): 152 m  Labs (12/21): K 4.6, creatinine 1.07, pro-BNP 734 Labs (1/22): LDL 127  PMH: 1. Interstitial lung disease: Followed by Dr. [name].  - 5 L home oxygen.  - On Ofev.  2. Hyperlipidemia 3. CAD: LHC (12/21) with 80% ostial small D1, 30% pLAD.  4. Pulmonary hypertension: Suspect group 3 PH.  - Echo (11/21): EF 60-65%, mild LVH, mildly decreased RV systolic function with mild RV enlargement, PASP 97 mmHg.  - RHC (12/21): Mean RA 3, PA 50/16 mean 27, mean PCWP 4, CI 2.35, PVR 6.1 WU.   SH: Married, lives in [place].  Rare ETOH, no smoking.   Family History  Problem Relation Age of Onset  . Stroke Father   . Lung cancer Brother        mets   ROS: All systems reviewed and negative except as per HPI.   Current Outpatient Medications  Medication Sig Dispense Refill  . aspirin 81 MG EC tablet Take 81 mg by mouth daily.     [name] atorvastatin (LIPITOR) 40 MG tablet Take 1 tablet (40 mg total) by mouth daily. 30 tablet 6  . benzonatate (TESSALON) 200 MG capsule Take 200 mg by mouth 2 (two) times daily as needed for cough.    .  Calcium Carbonate+Vitamin D 600-200 MG-UNIT TABS Take 1 tablet by mouth daily.    . furosemide (LASIX) 20 MG tablet Take 20 mg by mouth daily as needed.    . Nintedanib (OFEV) 100 MG CAPS Take 1 capsule (100 mg total) by mouth 2 (two) times daily. 60 capsule 11  . potassium chloride (MICRO-K) 10 MEQ CR capsule Take 10 mEq by mouth daily as needed.    . predniSONE (DELTASONE) 5 MG tablet TAKE 1 TABLET (5 MG TOTAL) BY MOUTH 2 (TWO) TIMES DAILY WITH A MEAL. 60 tablet 1   No current facility-administered medications for this encounter.   BP 124/78   Pulse 86   Wt 55.8 kg (123 lb)   SpO2 94%   BMI 21.11 kg/m  General: NAD Neck: No JVD, no thyromegaly or thyroid nodule.  Lungs: Dry crackles at bases bilaterally.  CV: Nondisplaced PMI.  Heart regular S1/S2, no S3/S4, no murmur.  Trace ankle edema.  No carotid bruit.  Normal pedal pulses.  Abdomen: Soft, nontender, no hepatosplenomegaly, no distention.  Skin: Intact without lesions or rashes.  Neurologic: Alert and oriented x 3.  Psych: Normal affect. Extremities: No clubbing or cyanosis.  HEENT: Normal.   Assessment/Plan: 1. Pulmonary hypertension: Mild-moderate pulmonary arterial HTN on RHC in 12/21, not as  impressive as PA pressure estimation on echo.  PVR significantly elevated at 6.1 WU.  Echo showed mild RV enlargement and mildly decreased RV systolic function.  I suspect group 3 PH due to interstitial lung disease.   - If she has not had a sleep study through pulmonary, this should be done.  - She will need a V/Q scan to formally rule out chronic PEs.  - She qualifies for Tyvaso for ILD-related PH. However, she is not interested in taking Tyvaso qid.  We discussed this at length today, I recommended that she try Tyvaso.  She is going to think about it and call me if she wants to try it.  - Based on RHC, I do not think she needs daily Lasix.  I suspect her chronic lower leg edema is more related to venous insufficiency => keep legs  elevated and continue to use compression stockings.   2. CAD: Nonobstructive CAD on 12/21 cath.  - Continue atorvastatin, check lipids today.  - Continue ASA 81 daily.  3. ILD: Per Dr. [name].  - Continue Ofev.  - Hopefully, can wean prednisone.    Followup in 4 months  Loralie Champagne 05/10/2020

## 2020-05-11 ENCOUNTER — Telehealth (HOSPITAL_COMMUNITY): Payer: Self-pay | Admitting: Surgery

## 2020-05-11 DIAGNOSIS — E785 Hyperlipidemia, unspecified: Secondary | ICD-10-CM

## 2020-05-11 MED ORDER — ATORVASTATIN CALCIUM 40 MG PO TABS: 80.0000 mg | ORAL_TABLET | Freq: Every day | ORAL | 6 refills | Status: DC

## 2020-05-11 NOTE — Telephone Encounter (Signed)
I spoke with Ms. Wertheim regarding her recent results and recommendation to increase Atorvastatin to 80 mg daily ger Dr. Aundra Dubin.  I updated this order in [facility] and sent prescription as well as scheduled follow-up lab work for 2 months.

## 2020-05-12 DIAGNOSIS — J849 Interstitial pulmonary disease, unspecified: Secondary | ICD-10-CM | POA: Diagnosis not present

## 2020-05-15 NOTE — Progress Notes (Signed)
LFT normal 05/07/20

## 2020-05-18 ENCOUNTER — Encounter (HOSPITAL_COMMUNITY): Payer: Self-pay

## 2020-05-19 DIAGNOSIS — J849 Interstitial pulmonary disease, unspecified: Secondary | ICD-10-CM | POA: Diagnosis not present

## 2020-05-26 ENCOUNTER — Telehealth (HOSPITAL_COMMUNITY): Payer: Self-pay | Admitting: Pharmacy Technician

## 2020-05-26 NOTE — Telephone Encounter (Signed)
Patient called in stating that she was ready to give the Tyvaso a try. Wanted to set up a visit for nursing to teach her how to administer the medication.   I provided her the phone number to [place], [phone] and advised her to call and have that set up.  Advised her to call me with any issues.  Charlann Boxer, CPhT

## 2020-06-12 DIAGNOSIS — J849 Interstitial pulmonary disease, unspecified: Secondary | ICD-10-CM | POA: Diagnosis not present

## 2020-06-19 DIAGNOSIS — J849 Interstitial pulmonary disease, unspecified: Secondary | ICD-10-CM | POA: Diagnosis not present

## 2020-07-06 ENCOUNTER — Other Ambulatory Visit (HOSPITAL_COMMUNITY): Payer: Medicare HMO

## 2020-07-09 ENCOUNTER — Other Ambulatory Visit (HOSPITAL_COMMUNITY): Payer: Medicare HMO

## 2020-07-10 ENCOUNTER — Other Ambulatory Visit (HOSPITAL_COMMUNITY): Payer: Self-pay | Admitting: Cardiology

## 2020-07-10 ENCOUNTER — Ambulatory Visit (HOSPITAL_COMMUNITY)
Admission: RE | Admit: 2020-07-10 | Discharge: 2020-07-10 | Disposition: A | Discharge status: Home / Self Care | Payer: Medicare HMO | Source: Ambulatory Visit | Attending: Cardiology | Admitting: Cardiology

## 2020-07-10 ENCOUNTER — Other Ambulatory Visit: Payer: Self-pay

## 2020-07-10 DIAGNOSIS — J84112 Idiopathic pulmonary fibrosis: Secondary | ICD-10-CM | POA: Diagnosis not present

## 2020-07-10 LAB — LIPID PANEL
Cholesterol: 245 mg/dL — ABNORMAL HIGH (ref 0–200)
HDL: 118 mg/dL (ref >40)
LDL Cholesterol: 115 mg/dL — ABNORMAL HIGH (ref 0–99)
Total CHOL/HDL Ratio: 2.1 RATIO
Triglycerides: 59 mg/dL (ref <150)
VLDL: 12 mg/dL (ref 0–40)

## 2020-07-10 NOTE — Progress Notes (Signed)
l

## 2020-07-12 DIAGNOSIS — J849 Interstitial pulmonary disease, unspecified: Secondary | ICD-10-CM | POA: Diagnosis not present

## 2020-07-19 DIAGNOSIS — J849 Interstitial pulmonary disease, unspecified: Secondary | ICD-10-CM | POA: Diagnosis not present

## 2020-08-03 ENCOUNTER — Other Ambulatory Visit (HOSPITAL_COMMUNITY): Payer: Self-pay

## 2020-08-03 MED ORDER — ATORVASTATIN CALCIUM 80 MG PO TABS: 80.0000 mg | ORAL_TABLET | Freq: Every day | ORAL | 3 refills | Status: AC

## 2020-08-07 ENCOUNTER — Other Ambulatory Visit: Payer: Self-pay

## 2020-08-07 ENCOUNTER — Emergency Department (HOSPITAL_BASED_OUTPATIENT_CLINIC_OR_DEPARTMENT_OTHER)
Admission: EM | Admit: 2020-08-07 | Discharge: 2020-08-07 | Disposition: A | Discharge status: Home / Self Care | Payer: Medicare HMO | Attending: Emergency Medicine | Admitting: Emergency Medicine

## 2020-08-07 ENCOUNTER — Encounter (HOSPITAL_BASED_OUTPATIENT_CLINIC_OR_DEPARTMENT_OTHER): Payer: Self-pay | Admitting: Emergency Medicine

## 2020-08-07 DIAGNOSIS — S8992XA Unspecified injury of left lower leg, initial encounter: Secondary | ICD-10-CM | POA: Diagnosis not present

## 2020-08-07 DIAGNOSIS — Z87891 Personal history of nicotine dependence: Secondary | ICD-10-CM | POA: Diagnosis not present

## 2020-08-07 DIAGNOSIS — I509 Heart failure, unspecified: Secondary | ICD-10-CM | POA: Diagnosis not present

## 2020-08-07 DIAGNOSIS — W228XXA Striking against or struck by other objects, initial encounter: Secondary | ICD-10-CM | POA: Diagnosis not present

## 2020-08-07 DIAGNOSIS — S81012A Laceration without foreign body, left knee, initial encounter: Secondary | ICD-10-CM | POA: Diagnosis not present

## 2020-08-07 DIAGNOSIS — S81812A Laceration without foreign body, left lower leg, initial encounter: Secondary | ICD-10-CM | POA: Diagnosis not present

## 2020-08-07 DIAGNOSIS — Z7982 Long term (current) use of aspirin: Secondary | ICD-10-CM | POA: Insufficient documentation

## 2020-08-07 NOTE — ED Provider Notes (Signed)
[facility] EMERGENCY DEPT Provider Note   CSN: [id number] Arrival date & time: 08/07/20  0421     History Chief Complaint  Patient presents with   skin tear    [name] is a 86 y.o. female.  Patient is an 86 year old female with history of pulmonary fibrosis on chronic steroids and oxygen.  Patient presents with injury to her left leg.  Patient was asleep tonight when she bumped her leg on an unknown object.  She then felt blood and laceration to the leg.  Patient has history of skin tears and poor wound healing related to underlying medical condition.  The history is provided by the patient.      Past Medical History:  Diagnosis Date   CHF (congestive heart failure) ([place])    Hemorrhoids    IPF (idiopathic pulmonary fibrosis) ([place])     Patient Active Problem List   Diagnosis Date Noted   Chronic respiratory failure with hypoxia ([place]) 05/24/2019   IPF (idiopathic pulmonary fibrosis) ([place]) 10/20/2014    Past Surgical History:  Procedure Laterality Date   BREAST EXCISIONAL BIOPSY Left    x 2   RIGHT/LEFT HEART CATH AND CORONARY ANGIOGRAPHY N/A 02/13/2020   Procedure: RIGHT/LEFT HEART CATH AND CORONARY ANGIOGRAPHY;  Surgeon: Larey Dresser, MD;  Location: [facility];  Service: Cardiovascular;  Laterality: N/A;   TONSILLECTOMY     age 54     OB History   No obstetric history on file.     Family History  Problem Relation Age of Onset   Stroke Father    Lung cancer Brother        mets    Social History   Tobacco Use   Smoking status: Former    Packs/day: 0.25    Years: 30.00    Pack years: 7.50    Types: Cigarettes    Quit date: 1989    Years since quitting: 33.4   Smokeless tobacco: Never  Substance Use Topics   Alcohol use: Yes    Alcohol/week: 7.0 standard drinks    Types: 7 Shots of liquor per week    Comment: 2 per day   Drug use: No    Home Medications Prior to Admission medications   Medication Sig Start Date  End Date Taking? Authorizing Provider  aspirin 81 MG EC tablet Take 81 mg by mouth daily.     [provider]  atorvastatin (LIPITOR) 80 MG tablet Take 1 tablet (80 mg total) by mouth daily. 08/03/20 08/03/21  Larey Dresser, MD  benzonatate (TESSALON) 200 MG capsule Take 200 mg by mouth 2 (two) times daily as needed for cough. 05/01/17   [provider]  Calcium Carbonate+Vitamin D 600-200 MG-UNIT TABS Take 1 tablet by mouth daily.    [provider]  furosemide (LASIX) 20 MG tablet Take 20 mg by mouth daily as needed.    [provider]  Nintedanib (OFEV) 100 MG CAPS Take 1 capsule (100 mg total) by mouth 2 (two) times daily. 11/19/19   Brand Males, MD  Nirmatrelvir & Ritonavir 20 x 150 MG & 10 x 100MG TBPK TAKE 2 TABLETS BY MOUTH 2 TIMES DAILY FOR 5 DAYS 04/02/20 04/02/21  Rudolpho Sevin, NP  potassium chloride (MICRO-K) 10 MEQ CR capsule Take 10 mEq by mouth daily as needed.    [provider]  predniSONE (DELTASONE) 5 MG tablet TAKE 1 TABLET (5 MG TOTAL) BY MOUTH 2 (TWO) TIMES DAILY WITH A  MEAL. 03/05/20   Brand Males, MD    Allergies    5-alpha reductase inhibitors and Prednisone  Review of Systems   Review of Systems  All other systems reviewed and are negative.  Physical Exam Updated Vital Signs BP (!) 159/95 (BP Location: Right Arm)   Pulse 89   Temp 98.6 F (37 C) (Oral)   Resp (!) 22   Ht 5' 4" (1.626 m)   Wt 54.4 kg   SpO2 96%   BMI 20.60 kg/m   Physical Exam Vitals and nursing note reviewed.  Constitutional:      General: She is not in acute distress.    Appearance: Normal appearance. She is not ill-appearing.  HENT:     Head: Normocephalic and atraumatic.  Pulmonary:     Effort: Pulmonary effort is normal.  Skin:    General: Skin is warm and dry.     Comments: There is a skin tear noted just below the left knee.  It is V-shaped and approximately 8 cm in length.  There is a very superficial layer of skin overlying  the wound.  Neurological:     Mental Status: She is alert.    ED Results / Procedures / Treatments   Labs (all labs ordered are listed, but only abnormal results are displayed) Labs Reviewed - No data to display  EKG None  Radiology No results found.  Procedures Procedures   Medications Ordered in ED Medications - No data to display  ED Course  I have reviewed the triage vital signs and the nursing notes.  Pertinent labs & imaging results that were available during my care of the patient were reviewed by me and considered in my medical decision making (see chart for details).    MDM Rules/Calculators/A&P  Injury is a skin tear not amenable to suturing.  This will be treated with local wound care and dressing changes.  She is to follow-up as needed for a wound check in the next week.  Final Clinical Impression(s) / ED Diagnoses Final diagnoses:  None    Rx / DC Orders ED Discharge Orders     None        Veryl Speak, MD 08/07/20 [phone]

## 2020-08-07 NOTE — ED Triage Notes (Signed)
  Patient comes in with skin tear on L lower leg that occurred around an hour ago.  Patient states she was in the bed and felt it bump something but unsure what, reached down and felt the skin tear and blood.  Patient states she gets skin tears easily.  Bleeding controlled at this time.  No pain.

## 2020-08-07 NOTE — Discharge Instructions (Addendum)
Local wound care with bacitracin and dressing changes twice daily.  Follow-up with your primary doctor next week for a wound check, and return to the ER if you develop redness, pus draining from the wound, increased pain, or other new and concerning symptoms.

## 2020-08-07 NOTE — ED Notes (Signed)
Skin tear cleaned with wound cleanser, dressed with bacitracin ointment, telfa pad and rolled gauze and tape. Teach back method used to teach patient to clean wound and that it should be cleaned and dressed  Twice daily.

## 2020-08-11 ENCOUNTER — Other Ambulatory Visit: Payer: Self-pay | Admitting: Internal Medicine

## 2020-08-12 DIAGNOSIS — J849 Interstitial pulmonary disease, unspecified: Secondary | ICD-10-CM | POA: Diagnosis not present

## 2020-08-13 DIAGNOSIS — S81812D Laceration without foreign body, left lower leg, subsequent encounter: Secondary | ICD-10-CM | POA: Diagnosis not present

## 2020-08-19 DIAGNOSIS — J849 Interstitial pulmonary disease, unspecified: Secondary | ICD-10-CM | POA: Diagnosis not present

## 2020-08-24 DIAGNOSIS — S80812A Abrasion, left lower leg, initial encounter: Secondary | ICD-10-CM | POA: Diagnosis not present

## 2020-08-24 DIAGNOSIS — L718 Other rosacea: Secondary | ICD-10-CM | POA: Diagnosis not present

## 2020-08-27 DIAGNOSIS — H6123 Impacted cerumen, bilateral: Secondary | ICD-10-CM | POA: Diagnosis not present

## 2020-09-07 DIAGNOSIS — L0390 Cellulitis, unspecified: Secondary | ICD-10-CM | POA: Diagnosis not present

## 2020-09-11 DIAGNOSIS — Z809 Family history of malignant neoplasm, unspecified: Secondary | ICD-10-CM | POA: Diagnosis not present

## 2020-09-11 DIAGNOSIS — Z008 Encounter for other general examination: Secondary | ICD-10-CM | POA: Diagnosis not present

## 2020-09-11 DIAGNOSIS — J84112 Idiopathic pulmonary fibrosis: Secondary | ICD-10-CM | POA: Diagnosis not present

## 2020-09-11 DIAGNOSIS — Z7952 Long term (current) use of systemic steroids: Secondary | ICD-10-CM | POA: Diagnosis not present

## 2020-09-11 DIAGNOSIS — R32 Unspecified urinary incontinence: Secondary | ICD-10-CM | POA: Diagnosis not present

## 2020-09-11 DIAGNOSIS — Z9981 Dependence on supplemental oxygen: Secondary | ICD-10-CM | POA: Diagnosis not present

## 2020-09-11 DIAGNOSIS — Z823 Family history of stroke: Secondary | ICD-10-CM | POA: Diagnosis not present

## 2020-09-11 DIAGNOSIS — E785 Hyperlipidemia, unspecified: Secondary | ICD-10-CM | POA: Diagnosis not present

## 2020-09-11 DIAGNOSIS — Z87891 Personal history of nicotine dependence: Secondary | ICD-10-CM | POA: Diagnosis not present

## 2020-09-11 DIAGNOSIS — J9610 Chronic respiratory failure, unspecified whether with hypoxia or hypercapnia: Secondary | ICD-10-CM | POA: Diagnosis not present

## 2020-09-11 DIAGNOSIS — Z7982 Long term (current) use of aspirin: Secondary | ICD-10-CM | POA: Diagnosis not present

## 2020-09-11 DIAGNOSIS — J849 Interstitial pulmonary disease, unspecified: Secondary | ICD-10-CM | POA: Diagnosis not present

## 2020-09-16 ENCOUNTER — Ambulatory Visit (HOSPITAL_COMMUNITY)
Admission: RE | Admit: 2020-09-16 | Discharge: 2020-09-16 | Disposition: A | Discharge status: Home / Self Care | Payer: Medicare HMO | Source: Ambulatory Visit | Attending: Cardiology | Admitting: Cardiology

## 2020-09-16 ENCOUNTER — Other Ambulatory Visit: Payer: Self-pay

## 2020-09-16 ENCOUNTER — Encounter (HOSPITAL_COMMUNITY): Payer: Self-pay | Admitting: Cardiology

## 2020-09-16 VITALS — BP 124/70 | HR 87 | Wt 122.4 lb

## 2020-09-16 DIAGNOSIS — I272 Pulmonary hypertension, unspecified: Secondary | ICD-10-CM

## 2020-09-16 DIAGNOSIS — J84112 Idiopathic pulmonary fibrosis: Secondary | ICD-10-CM | POA: Insufficient documentation

## 2020-09-16 LAB — HEPATIC FUNCTION PANEL
ALT: 28 U/L (ref 0–44)
AST: 27 U/L (ref 15–41)
Albumin: 3.5 g/dL (ref 3.5–5.0)
Alkaline Phosphatase: 82 U/L (ref 38–126)
Bilirubin, Direct: 0.2 mg/dL (ref 0.0–0.2)
Indirect Bilirubin: 0.7 mg/dL (ref 0.3–0.9)
Total Bilirubin: 0.9 mg/dL (ref 0.3–1.2)
Total Protein: 6.6 g/dL (ref 6.5–8.1)

## 2020-09-16 LAB — LIPID PANEL
Cholesterol: 235 mg/dL — ABNORMAL HIGH (ref 0–200)
HDL: 124 mg/dL (ref >40)
LDL Cholesterol: 98 mg/dL (ref 0–99)
Total CHOL/HDL Ratio: 1.9 RATIO
Triglycerides: 67 mg/dL (ref <150)
VLDL: 13 mg/dL (ref 0–40)

## 2020-09-16 NOTE — Patient Instructions (Signed)
Labs done today. We will contact you only if your labs are abnormal.  No medication changes were made. Please continue all current medications as prescribed.  Your physician recommends that you schedule a follow-up appointment in: 6 months with an echo prior to your exam. Please contact our office in December to schedule a January appointment.   If you have any questions or concerns before your next appointment please send [place] a message through [place] or call our office at [phone].    TO LEAVE A MESSAGE FOR THE NURSE SELECT OPTION 2, PLEASE LEAVE A MESSAGE INCLUDING: YOUR NAME DATE OF BIRTH CALL BACK NUMBER REASON FOR CALL**this is important as we prioritize the call backs  YOU WILL RECEIVE A CALL BACK THE SAME DAY AS LONG AS YOU CALL BEFORE 4:00 PM   Do the following things EVERYDAY: Weigh yourself in the morning before breakfast. Write it down and keep it in a log. Take your medicines as prescribed Eat low salt foods--Limit salt (sodium) to 2000 mg per day.  Stay as active as you can everyday Limit all fluids for the day to less than 2 liters   At the [facility], you and your health needs are our priority. As part of our continuing mission to provide you with exceptional heart care, we have created designated Provider Care Teams. These Care Teams include your primary Cardiologist (physician) and Advanced Practice Providers (APPs- Physician Assistants and Nurse Practitioners) who all work together to provide you with the care you need, when you need it.   You may see any of the following providers on your designated Care Team at your next follow up: Dr Glori Bickers Dr Haynes Kerns, NP Lyda Jester, [place] Audry Riles, PharmD   Please be sure to bring in all your medications bottles to every appointment.

## 2020-09-17 NOTE — Progress Notes (Signed)
PCP: [name], MD Pulmonary: Dr. [name] HF Cardiology: Dr. Aundra Dubin  86 y.o. with history of interstitial lung disease and pulmonary hypertension by echo was referred by Dr. [name] for evaluation of pulmonary hypertension.  Patient has the the diagnosis of ILD for several years now.  She is on home oxygen 5 L at rest.  She has started on Ofev in 1/21, and she is also on low dose prednisone.  Echo in 11/21 showed mild RV enlargement and mildly decreased RV systolic function.  PASP was estimated at 97 mmHg.  She had RHC/LHC in 12/21, showing mild CAD and mild-moderate pulmonary arterial hypertension with elevated PVR.    She had COVID-19 in 2/22, has recovered well.   Patient tried Tyvaso for PH-ILD.  She felt "miserable."  Breathing was worse, she felt sick.  She tried it for 2 wks then stopped it.   She returns for followup of pulmonary hypertension.   Using home oxygen.  No dyspnea walking on flat ground if she uses her oxygen.  Short of breath with moderate activity and climbing stairs.  She still gets out some, plays bridge regularly.  No orthopnea/PND.  No lightheadedness/syncope.  No chest pain.    6 min walk (1/22): 152 m  Labs (12/21): K 4.6, creatinine 1.07, pro-BNP 734 Labs (1/22): LDL 127 Labs (5/22): LDL 115  PMH: 1. Interstitial lung disease: Followed by Dr. [name].  - 5 L home oxygen.  - On Ofev.  2. Hyperlipidemia 3. CAD: LHC (12/21) with 80% ostial small D1, 30% pLAD.  4. Pulmonary hypertension: Suspect group 3 PH (PH-ILD).   - Echo (11/21): EF 60-65%, mild LVH, mildly decreased RV systolic function with mild RV enlargement, PASP 97 mmHg.  - RHC (12/21): Mean RA 3, PA 50/16 mean 27, mean PCWP 4, CI 2.35, PVR 6.1 WU.  - She did not tolerate Tyvaso  SH: Married, lives in [place].  Rare ETOH, no smoking.   Family History  Problem Relation Age of Onset   Stroke Father    Lung cancer Brother        mets   ROS: All systems reviewed and negative except as  per HPI.   Current Outpatient Medications  Medication Sig Dispense Refill   aspirin 81 MG EC tablet Take 81 mg by mouth daily.      atorvastatin (LIPITOR) 80 MG tablet Take 1 tablet (80 mg total) by mouth daily. 90 tablet 3   benzonatate (TESSALON) 200 MG capsule Take 200 mg by mouth 2 (two) times daily as needed for cough.     Calcium Carbonate+Vitamin D 600-200 MG-UNIT TABS Take 1 tablet by mouth daily.     Nintedanib (OFEV) 100 MG CAPS Take 1 capsule (100 mg total) by mouth 2 (two) times daily. 60 capsule 11   predniSONE (DELTASONE) 10 MG tablet Take 10 mg by mouth daily in the afternoon.     No current facility-administered medications for this encounter.   BP 124/70   Pulse 87   Wt 55.5 kg (122 lb 6.4 oz)   SpO2 93% Comment: 4 l n/c  BMI 21.01 kg/m  General: NAD Neck: No JVD, no thyromegaly or thyroid nodule.  Lungs: Dry crackles at bases.  CV: Nondisplaced PMI.  Heart regular S1/S2, no S3/S4, no murmur.  1+ ankle edema.  No carotid bruit.  Normal pedal pulses.  Abdomen: Soft, nontender, no hepatosplenomegaly, no distention.  Skin: Intact without lesions or rashes.  Neurologic: Alert and oriented x 3.  Psych: Normal  affect. Extremities: No clubbing or cyanosis.  HEENT: Normal.    Assessment/Plan: 1. Pulmonary hypertension: Mild-moderate pulmonary arterial HTN on RHC in 12/21, not as impressive as PA pressure estimation on echo.  PVR significantly elevated at 6.1 WU.  Echo showed mild RV enlargement and mildly decreased RV systolic function.  I suspect group 3 PH due to interstitial lung disease (PH-ILD).  She was unable to tolerate Tyvaso.   - If she has not had a sleep study through pulmonary, this should be done.  - She should have a V/Q scan to formally rule out chronic PEs, she wants to hold off on this for now.  - As she was unable to tolerate Tyvaso, we will follow her RV over time and treat RV failure with diuretics.  Currently, I think that she has some venous  insufficiency but not significant volume overload so she does not need standing Lasix.  - I suspect her chronic lower leg edema is more related to venous insufficiency => keep legs elevated and continue to use compression stockings.   - Echo to reassess RV at followup in 6 months.  2. CAD: Nonobstructive CAD on 12/21 cath.  - Continue atorvastatin, check lipids/LFTs today.  - Continue ASA 81 daily.  3. ILD: Per Dr. [name].  - Continue Ofev.  - Hopefully, can wean prednisone.   - Did not tolerate Tyvaso as above.   Followup in 6 months with echo  Loralie Champagne 09/17/2020

## 2020-09-18 DIAGNOSIS — J849 Interstitial pulmonary disease, unspecified: Secondary | ICD-10-CM | POA: Diagnosis not present

## 2020-10-08 ENCOUNTER — Encounter: Payer: Self-pay | Admitting: Internal Medicine

## 2020-10-08 ENCOUNTER — Ambulatory Visit: Payer: Medicare HMO | Admitting: Internal Medicine

## 2020-10-08 ENCOUNTER — Other Ambulatory Visit: Payer: Self-pay

## 2020-10-08 VITALS — BP 116/70 | HR 81 | Temp 97.8°F | Ht 64.0 in | Wt 121.8 lb

## 2020-10-08 DIAGNOSIS — Z7952 Long term (current) use of systemic steroids: Secondary | ICD-10-CM

## 2020-10-08 DIAGNOSIS — Z5181 Encounter for therapeutic drug level monitoring: Secondary | ICD-10-CM

## 2020-10-08 DIAGNOSIS — J84112 Idiopathic pulmonary fibrosis: Secondary | ICD-10-CM

## 2020-10-08 DIAGNOSIS — J849 Interstitial pulmonary disease, unspecified: Secondary | ICD-10-CM

## 2020-10-08 DIAGNOSIS — Z8616 Personal history of COVID-19: Secondary | ICD-10-CM

## 2020-10-08 DIAGNOSIS — I2723 Pulmonary hypertension due to lung diseases and hypoxia: Secondary | ICD-10-CM

## 2020-10-08 DIAGNOSIS — J9611 Chronic respiratory failure with hypoxia: Secondary | ICD-10-CM | POA: Diagnosis not present

## 2020-10-08 NOTE — Progress Notes (Signed)
IOV 03/21/2017  Chief Complaint  Patient presents with   Advice Only    Former RA pt. Pt states she has been going to [facility] and is on 3-4L O2 with exertion and sometimes at night. Has SOB mainly with exertion.    [name] 86 y.o. female with interstitial lung disease.  History is gained from talking to her and review of the chart.  She was originally diagnosed with interstitial lung disease by Dr. Kara Mead in our practice.  Subsequently she transferred her care to [facility] Dr. [name] who has been following her.  Review of the chart shows the differential diagnosis is IPF versus chronic hypersensitivity pneumonitis based on some air trapping done in her high-resolution CT scan of the chest in 2017/2018.  She is known to have chronic stable interstitial lung disease on previous imaging dating as back as 2008 but she is only aware of her interstitial lung disease in the recent few years.  There is excellent documentation in Dr. [name] medical notes.  It appears based on the goals of care of the patient and her exertional desaturation that surgical lung biopsy was deferred and anti-fibrotic therapy was deferred.  Patient confirms the same.  She is extremely physically fit and thus good aerobic activity on 4 L of oxygen outpacing people much younger than her.  It appears over many years has been a gradual decline in her fibrosis and pulmonary health but she still extremely functional and does well.  She is mainly here today to have a pulmonary fibrosis resource in [place], [place].  She sees Dr. Lauris Chroman every 4 months.  She prefers to see Dr. Lauris Chroman and do all her monitoring in terms of her 6-minute walk test, pulmonary function test and her high-resolution CT scan at [facility] because she is very comfortable with the personnel there.  Yet because she is local in [place] she wants to tap into the resources here in case of an emergency.  We discussed  about alternating her follow-up between myself and Dr. Lauris Chroman every 4 months which would make her visit to either one place every 8 months and she seemed to think this was a good plan.  At this point as she is stable and she is satisfied with all pillars of our care.  She is not interested in pulmonary rehabilitation because rightfully so she exercises pretty actively.  She has been the patient support group and did not find value in it.  She is not a transplant candidate.  And she is deferred anti-fibrotic therapy.  She might or might not be interested in research trial and this was not discussed.  It appears the only new issue other than her overall stability is that for the last 1 year she has mild random dysphagia although no active acid reflux.  This can happen for liquids or solids or tablets and is very random.  She has not seen a gastroenterologist for this.  She is not on any proton pump inhibitors.  In addition she tells me she uses of feather down pillow all her life.  She is also use talcum powder all her life and with the recent lawsuit against [place] about talcum powder she wonders if this could be contributory to her pulmonary fibrosis.  Otherwise [employer] of chest physicians interstitial lung disease questionnaire is positive for cigarette smoking from age 70 when she smoked a pack a week to age 75.  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  She did recently try a course of prednisone through Dr. Lauris Chroman for her interstitial lung disease but she did not seem to benefit from this and this is been slowly weaned off.  Her autoimmune antibody at [facility] and in [place] was normal.  Pulmonary function test from [facility] was reviewed and the images of CT scan of the chest done in [place] visualized  Walking desaturation test on 03/21/2017 185 feet x 3 laps on ROOM AIR:  did not desaturate. Rest pulse ox was 100%, final pulse ox was 88%. HR response  71/min at rest to 120/min at peak exertion. Patient [name]  Did yes Desaturate </= 88% . [name] yes  Desaturated </= 3% points. [name] yes did get tachyardic  IMPRESSION: 1. Pulmonary parenchymal pattern of fibrosis is progressive, most indicative of usual interstitial pneumonitis (UIP). 2. Coronary artery calcification.     Electronically Signed   By: Lorin Picket M.D.   On: 10/27/2014 15:51 OV 06/21/2017   Chief Complaint  Patient presents with   Follow-up    Pt saw Dr. Lauris Chroman 2/4.  Pt states she has been doing well up until 10 days ago when she developed a cold and is coughing, postnasal drainage and some mild chest tightness.   Glynn P Economos presents for follow-up of interstitial lung disease [IPF most likely versus chronic HP less likely],. Is supportive care. She follows mostly at [facility] with Dr. [name]. I am her local resource. This is a scheduled appointment. Last seen by Dr. Lauris Chroman in February 2019. Notes reviewed and patient also confirms the same. It appears that without low-dose prednisone her cough relapsed. Therefore she went back on 5 mg prednisone and she was doing well in terms of her cough. However 10 days ago she developed a runny nose and cold and since then has increased cough compared to baseline. She also has a nasal twang. This a slight increase in sputum production compared to baseline and the sputum is off color. This is not normal for her. The might be some increased wheezing but his sleep quality is good. Overall effort tolerance continues to be good. She exercises with oxygen. At room air at rest she does not need oxygen. Most recently in February 2019 her ILD was deemed stable by Dr. Lauris Chroman. [place] go to the beach in [place] few days she wants treatment recommendation for the current deterioration. She does not want to do high dose prednisone because of fear of hallucinationsthat have happened in the past. She also now wants  to alternate pulmonary fibrosis follow-up between [facility] and myself.   She also last several months c/o loss of taste and asking for directions in resolving this issue      OV 01/23/2018  Subjective:  Patient ID: [name], female , DOB: 04/08/1934 , age 13 y.o. , MRN: [id number] , ADDRESS: [place] [address]   01/23/2018 -   Chief Complaint  Patient presents with   Follow-up    States her breathing has been at her baseline. Denies chest pain or chest discomfort. States tesslon helps but it is too expensive.      HPI [facility] 86 y.o. -returns for follow-up of her interstitial lung disease that has a differential diagnosis between IPF and chronic hypersensitivity pneumonitis.  After seeing me last she saw Dr. Lauris Chroman in August 2019.  Since then she has  been doing stable.  I reviewed those notes.  Compared to recent visits her CT scan is stable and her walk test is stable.  She is able to manage with 2 L oxygen.  She has a portable system.  She does feel that she is unable to do more than she would like to do because of her ILD.  She does admit that compared to 3 years ago she says progressive ILD.  She also has significant cough for which she likes Ladona Ridgel but she says insurance is making him pay $3 a pill and so she is upset about that.  Review of immunization record shows that she has not had Pneumovax or flu shot this year but she says she is up-to-date.  We discussed the new [employer] trial for progressive non-IPF ILD where there was beneficial effect of nintedanib.  We discussed this and she is willing to try this after the new year 2020.  She wants to make sure that her [place] ILD specialist Dr. Lauris Chroman is on board with this.  We discussed the side effects in detail.    OV 03/08/2018  Subjective:  Patient ID: [name], female , DOB: 1934/09/08 , age 68 y.o. , MRN: [id number] , ADDRESS: [place] [id number]   03/08/2018 -    Chief Complaint  Patient presents with   Follow-up    Pt states she has been well since last visit. States she does become SOB with exertion and does have an occ cough with clear mucus. Denies any CP.    ilD FOLLOWUP   HPI [facility] 18 y.o. -returns for followup. Over xmas 2019 because of social stress increased prednisone to 13m per day but now back to 569mper day. Dyspnea with exergtion continues but stable. Cough that is very bothersome continue.  She takes TeBest boyor this this helps her.  However it is expensive.  She is placed a call to [employer] apparently they are going to try to work out where she can make it more affordable because I ILD is a diagnosis.  She admitted that she has down pillows and feather blankets.  1 of her close friends Mr. TaLovena Leho also has hypersensitivity pneumonitis and sees me advised her to get rid of those and therefore she is in the process of doing that.  She asked about further options and treatment of a cough.  We discussed gabapentin no increasing steroid dose participating in a cough study but she did not want to add these complications to her.  She seems content with the option of trying an inspiratory muscle trainer and seeing if the cough would get better.  Overall given her progressive ILD and the possibility that this is IPF she is willing to now try nintedanib.  We discussed nintedanib extensively.  She denies any heart disease.  She is not on any major anticoagulation.  She does not have any GI issues other than constipation.  She is willing to try nintedanib.  She is nervous about the co-pay and said if the co-pay is too expensive despite charity program then she will not take it.  She is [facility].          OV 01/17/2019  Subjective:  Patient ID: HoMammie Lorenzofemale , DOB: 05/1934/02/15 age 86.o. , MRN: [id number] [address] [id number]  01/17/2019 -   Chief Complaint  Patient presents with   Follow-up    Pt states she has been okay since last visit. Pt states SOB has become worse and is coughing a lot.   Interstitial lung disease-IPF [UIP in August 2016 CT scan read locally] versus second distant possibility chronic hypersensitivity pneumonitis [feather pillow exposure and air trapping on CT scan per report]   HPI [facility] 86 y.o. -last seen January 2020.  Since then in the last 6 months he has declined significantly.  She did see Dr. Lauris Chroman about a month ago at [facility].  I reviewed the chart.  Her shortness of breath with exertion has declined.  With exercise and walking she is using 4 L of oxygen.  Her cough also got worse.  He has increased the prednisone to 10 mg/day.  This is helped her cough.  Nevertheless she randomly gets severe coughing spells that makes her feel that she is going to die.  She is got a facemask oxygen is going to use that.  There was conversation about increasing the prednisone even further according to her history but she declined.  At this point in time she just wants to monitor the situation.  She certainly is open to taking more prednisone if the cough got worse.  She did have a high-resolution CT scan of the chest December 10, 2018 at [facility].  I reviewed the result.  Images not available for visualization.  They report definite persistent air trapping.  They also report some nodular opacities in the left lung.  Overall worsening fibrosis especially on the left greater than the right.  She did have an echocardiogram.  Her symptom scores currently are listed below and it shows significant level of symptoms.  We discussed antifibrotic nintedanib.  She says she is so frustrated because of the cost.  She says she tried a level best to get the cost down but could not.  She is pretty pessimistic that she will not be able to get a discount on the drug.  We discussed about the  fact her disease continues to be progressive and to see if the cost would justify some of the benefit it might give.  She is willing to have me inquire about the cost.  We discussed research as a care option.  We discussed in detail the concept of research being voluntary.  The principles of randomization to placebo.  The need to do study visits.  Inclusion exclusion criteria.  She is willing to go through screening for any protocol for which she might qualify.  This will be possible after January 2020.  She had questions about the Covid vaccine.  She is social distancing.  She is afraid of getting Covid.     OV 04/22/2019  Subjective:  Patient ID: [name], female , DOB: [birth year] , age 22 y.o. , MRN: [id number] , ADDRESS: [place] [id number]   04/22/2019 -   Chief Complaint  Patient presents with   Follow-up    Pt states she has been doing well since last visit and states her breathing is stable.   Interstitial lung disease ( Dr Lauris Chroman and Dr [name])  -IPF [UIP in August 2016 CT scan read locally] versus   - chronic hypersensitivity pneumonitis [feather pillow exposure and air trapping on CT scan per report]  - started [facility] Oct 2017 - [facility]is [facility]an  - Dickey Gave Jan 2021  - air purifier  since jan 2021   HPI [place] 86 y.o. -presents with her husband.  She says overall she is doing well.  She is now taking the nintedanib for the last 3 weeks.  She is entering the fourth week of the nintedanib.  She says since she started nintedanib cough is improved all the dyspnea is the same.  She now has air purifier at home.  Review of her symptom score shows improvement in her symptoms.  In terms of her nintedanib tolerance she is tolerating it fine.  She says so far no side effects.  Liver function test March 27, 2019 is normal.  She is interested in research protocols.  On this visit I was able to review her [facility] records.  She last saw Dr. Lauris Chroman in October  2020.  Dr. Lauris Chroman was concerned about her progression.  She did have a high-resolution CT chest that I cannot visualize the image myself because it was done at [facility] the report there is one that is more consistent with an alternative diagnosis to UIP and more consistent with chronic hypersensitivity pneumonitis.  As always a debate between IPF versus chronic hypersensitive pneumonitis in Ms. Santagata with the weight of the clinical evidence being more towards IPF per [facility] records.  I reviewed our records and so far we have never discussed in our multidisciplinary case conference.  Her walking desaturation test clearly shows deterioration compared to 1 year ago  She also told me a little bit of blurring of vision on and off with prednisone.  She wanted to know safe prednisone dose.  She feels 5 mg of prednisone does not control her symptoms well but 10 mg dose.  Her main new issue - new issue venous stasis RLE.  She has a nonhealing wound ulcer that is over an inch in size.  She is wrapped it with TED stockings and Ace wrap.  I am not able to see the wound.  She sees the wound care here.  She keeps the leg elevated intermittently during the daytime per their advice.  She is frustrated by this.   HRCT oct 2020 [facility]  Impression: 1. Slightly increased left greater than right lung pulmonary fibrosis favored to be due to chronic hypersensitivity pneumonitis. Increased superimposed nodular opacities in the left lung are favored to be related to worsening fibrosis with superimposed infection/malignancy not excluded, attention on follow-up.  Electronically Reviewed by:  Verdell Carmine, MD, [facility] Electronically Reviewed on:  12/10/2018 12:00 PM  I have reviewed the images and concur with the above findings.  Electronically Signed by:  Tessa Lerner, MDPhD,[facility] Electronically Signed on:  12/10/2018 5:39 PM   [facility]cho [facility] oct 2020 INTERPRETATION  --------------------------------------------------------------- NORMAL LEFT VENTRICULAR SYSTOLIC FUNCTION WITH MILD LVH NORMAL RIGHT VENTRICULAR SYSTOLIC FUNCTION VALVULAR REGURGITATION: TRIVIAL MR, TRIVIAL PR, MILD TR NO VALVULAR STENOSIS MILD TO MODERATE TR NO PRIOR STUDY FOR COMPARISON   OV 08/02/2019 - telephone visit. Identified with 2PHI. Called patient on her cell phone 11:58 AM   Subjective:  Patient ID: [name], female , DOB: 1934/07/16 , age 35 y.o. , MRN: [id number] , ADDRESS: [place] [address]  Interstitial lung disease ( Dr Lauris Chroman and Dr [name])  -IPF [UIP in August 2016 CT scan read locally - [facility] - [facility] at [facility] read in 2011 and 2016 the odds of the scan being more consistent with hypersensitive pneumonitis is low.  The odds are  this is UIP/IPF.  However October 2020 scans from [facility] would be most helpful.  Regardless there is progression and it is appropriate patients on antifibrotic therapy as she is right now] versus   - chronic hypersensitivity pneumonitis [feather pillow exposure and air trapping on CT scan per report]  - started steroids Oct 2017 - [facility] Lauris Chroman  - Dickey Gave Jan 2021 - stopped full dose may 2021  - air purifier since jan 2021   08/02/2019 -     HPI [place] 86 y.o. - says she had a  Wound on right lower extremity following a bump in dec 2020. Did not heal. Jeris Penta to wound center for 3 months. Discharged a week ago from followup., DUring that time her RLE was wrapped in compression bandages. But now she is not on that. And since then ankles are swollen. Now she is on "Suppose" which helps. But as she removes it - > leg swelling gets worse. Swelling is bilateral but RLE > LLE.  Wnts to know iof ILD is causing this. Answered that is possible via Cable mechanism. Reviewed echo from oct 2020- > this did not show RV strain. Currently per her hx - does not sound like cellulitis or DVT (all  chronic and unchanged). Nto currently interested in lasix.   Overall, sicne jan 2021 says ILD is worse and using more o2. Ngiht usingi 4L [place] at night and rest. Sometimes needs 5L [place] with exertion. When she goes to groceries - > takes portable o2 concentrator  Now on ofev 150 mg tablet - taking it once a day since 07/14/19. She is not sur if she can take bid / once daily alternate day as yet. Wants this lower regime for 1 month and then try esclating  Also down to 54m prednisone Continue on tessalon perles  Doing ok on it  Oct 2020> [facility] is not uploaded in our PACT    ROS  -  OV 09/23/2019   Subjective:  Patient ID: HMammie Conway female , DOB: [birth year] age 86y.o. years. , MRN: [id number]  ADDRESS: [address] [address]  GLavone Orn MD Providers : Treatment Team:  Attending Provider: RBrand Males MD   Interstitial lung disease ( Dr GLauris Chromanand Dr RChase Caller  -IPF [UIP in August 2016 CT scan read locally - [facility] the odds of the scan being more consistent with hypersensitive pneumonitis is low.  The odds are this is UIP/IPF.  [facility]  Regardless there is progression and it is appropriate patients on antifibrotic therapy as she is right now] versus   - chronic hypersensitivity pneumonitis [feather pillow exposure and air trapping on CT scan per report]  - started steroids Oct 2017 - [facility] Dr GLauris Chroman - oDickey GaveJan 2021 - stopped full dose may 2021 -> since then 1532monce daily  - air purifier since jan 2021     09/23/2019 -returns for follow-up.  At this visit she tells me that her last visit [facility] in [facility].  He is now no longer seeing ILD patients or any patients and therefore she would just will keep a follow-up with me.  She is taking prednisone 10  mg/day although splitting it is 5 mg twice daily.  She says this gives her more energy.  She says she is using 4-5 L at rest and continuously although today when we walked at room air at rest she was fine and she only dropped to 88% after walking 2 laps and went down to 84% after walking 3 laps.  The distance is shown below.  She wants to qualify for at least 10 L oxygen.  I have advised her she may need to come for oxygen titration test.  In terms of her nintedanib she is taking 150 mg once daily.  She tried to go to an average of 200 mg total for the day by taking 150 mg twice daily Monday Wednesday Friday and then taking 150 mg once daily on Tuesday Thursday and Saturday and rotating between the 2 on Sunday.  However she says discussed lot of diarrhea and she stopped it.  Her weight appears stable.  She wants to see if she can escalate to a slightly higher dose on the nintedanib because I have advised her that 150 mg once daily is subtherapeutic.  She does have some foot numbness problems.  I have suggested she talk this with the primary care physician.  Her symptom score and PFT shows a steady progressive decline in her ILD.  Her symptom score shows continued worsening.    OV 12/26/2019   Subjective:  Patient ID: [name], female , DOB: [birth year], age 66 y.o. years. , MRN: [id number],  ADDRESS: [place] [address] PCP  [name], MD Providers : Treatment Team:  Attending Provider: Brand Males, MD Patient Care Team: [name], MD as PCP - General (Internal Medicine)    Interstitial lung disease ( Dr Lauris Chroman and Dr [name])  -IPF [UIP in August 2016 CT scan read locally - April 2021 [facility] - [facility] CT scans at [facility] read in 2011 and 2016 the odds of the scan being more consistent with hypersensitive pneumonitis is low.  The odds are this is UIP/IPF.  However October 2020 scans from [facility] would be most helpful.   Regardless there is progression and it is appropriate patients on antifibrotic therapy as she is right now] versus   - chronic hypersensitivity pneumonitis [feather pillow exposure and air trapping on CT scan per report]  - started steroids Oct 2017 - [facility] Lauris Chroman  - Dickey Gave Jan 2021 - stopped full dose may 2021 -> since then 187m once daily -> on 1040mtwice daily by Aug 2021  - air purifier since jan 2021  - [facility] MDD: UIP/IPF - no air trapping on Oct 2020 and Oct 2021 CT -> dx is IPF bsed on April 2021 conference and more formally infored to patient 12/26/19   Left diaphragm elevation-chronic and contributing to asymmetry of basal honeycombing  Chief Complaint  Patient presents with   Follow-up    Pt states she has been doing okay since last visit. States that she is tolerating the OFEV so far. Pt does still have SOB but states her cough is better.       [facility]returns for follow-up with her husband.  At this visit multiple issues to discuss.  At this point in time in terms of therapy she is on nintedanib.  She is able to do 100 mg twice daily since August 2021.  She is not having much side effects.  In terms of symptoms on the symptom score she  seems stable but she tells me that she is actually doing less and less she is not able to exercise anymore because of hypoxemia.  She uses oxygen 75% of the time.  She uses 4-5 L at night while sleeping and 4-5 L with portable system when exerting.  She walks of the driveway and she would desaturate to less than 88%.  She states she is restricted her exertion but she still does go around to the grocery store with the oxygen.  Her weight is stable on nintedanib at 126 pounds.  Today she will have liver function tests  In terms of her disease severity: She had pulmonary function test we reviewed this that shows steady progression over time.  She also had high-resolution CT chest that is definitely progressed compared to last year.   Again our radiologist feels quite confidently that the pattern is definitely for UIP.  There is no suggestion of alternative diagnosis such as hypersensitive pneumonitis.  I went over this with the patient and said that we would formally classify her as IPF.  Because of this diagnosis we then discussed the prednisone intake which she understands was started for possibility of hypersensitive pneumonitis.  Explained to her that our radiologist are not seeing air trapping.  She does say that the prednisone does make her feel better and when she tapers that she could feel worse.  Now that she is willing to try to reduce the prednisone based on data that in IPF chronic prednisone could potentially be harmful but overall she says that she has tolerated prednisone quite well.  We went over the general side effects of prednisone including bone health.  She is worried about her chronic pedal edema.  This is getting worse.  Foot elevation is not helping.  She says compression stockings can be very painful for her.  In the past she was on diuretic therapy.  She is willing to restart this.  She does not understand the pathologic physiologic basis for this.  We went over pulmonary hypertension, venous stasis skin bruising aging and venous incompetency.  We discussed the possibility of modulating pulmonary artery pressures with the help of inhaled nitric oxide as a trial inhaled treprostinil as standard of care or as a trial.  This will be dependent on cardiac data.  I informed her that we will discuss this more in the future as we get more information  She is also worried about her recent CT findings that shows aortic valve calcifications and coronary artery calcifications.  She does not have chest pain but she says this description is new to her.  Her last echocardiogram was over a year ago at [facility].  I reviewed the records could not find it.  She does not have any chest pain she is not seen a cardiologist.  She  never had a stress test.  She is willing to go through work-up for this.       Results for MARIETTA, SIKKEMA (MRN [id number]) as of 01/23/2018 17:05  Ref. Range 10/28/2014 16:39 10[facility]2017 [facility]2018 [facility]  FVC-Pre Latest Units: L 2.19 1.85 1.64 1.58L 1.45  FVC-%Pred-Pre Latest Units: % 84 69% 66  60%   Results for JASMYNN, PFALZGRAF (MRN [id number]) as of 01/23/2018 17:05  Ref. Range 10/28/2014 16:39 01/23/2018  12/10/2018 4.4/21 12/16/19 - GLI equation  DLCO unc Latest Units: ml/min/mmHg 12.16 8.36 5.68 6.6 7.33  DLCO unc % pred Latest Units: % 52 51% 30  40%     CLINICAL DATA:  86 year old female with history of interstitial lung disease. Worsening oxygenation and shortness of breath since February.  CT CHEST 12/19/19   EXAM: CT CHEST WITHOUT CONTRAST   TECHNIQUE: Multidetector CT imaging of the chest was performed following the standard protocol without intravenous contrast. High resolution imaging of the lungs, as well as inspiratory and expiratory imaging, was performed.   COMPARISON:  Chest CT 12/10/2018.   FINDINGS: Cardiovascular: Heart size is normal. There is no significant pericardial fluid, thickening or pericardial calcification. There is aortic atherosclerosis, as well as atherosclerosis of the great vessels of the mediastinum and the coronary arteries, including calcified atherosclerotic plaque in the left main, left anterior descending, left circumflex and right coronary arteries. Calcifications of the aortic valve.   Mediastinum/Nodes: No pathologically enlarged mediastinal or hilar lymph nodes. Please note that accurate exclusion of hilar adenopathy is limited on noncontrast CT scans. Esophagus is unremarkable in appearance. No axillary lymphadenopathy.   Lungs/Pleura: High-resolution images demonstrate widespread areas of septal thickening, subpleural reticulation, traction bronchiectasis, parenchymal banding and extensive  honeycombing. Findings are asymmetric (left greater than right). In the right lung, there is a definitive craniocaudal gradient. Overall, findings are progressive compared to prior study from 2020. No acute consolidative airspace disease. No pleural effusions. No definite suspicious appearing pulmonary nodules or masses are noted.   Upper Abdomen: Elevation of the left hemidiaphragm. Aortic atherosclerosis.   Musculoskeletal: There are no aggressive appearing lytic or blastic lesions noted in the visualized portions of the skeleton.   IMPRESSION: 1. Progressive worsening interstitial lung disease, with a spectrum of findings considered diagnostic of usual interstitial pneumonia (UIP) per current ATS guidelines. The asymmetry of the findings (left greater than right) may be related to chronic left hemidiaphragm paralysis. 2. Aortic atherosclerosis, in addition to left main and 3 vessel coronary artery disease. 3. There are calcifications of the aortic valve. Echocardiographic correlation for evaluation of potential valvular dysfunction may be warranted if clinically indicated.   Aortic Atherosclerosis (ICD10-I70.0).     Electronically Signed   By: Vinnie Langton M.D.   On: 12/20/2019 06:58    OV 04/16/2020  Subjective:  Patient ID: [name], female , DOB: [birth year] , age 32 y.o. , MRN: [id number] , ADDRESS: [place] [id number] PCP [name], MD Patient Care Team: [name], MD as PCP - General (Internal Medicine) Fay Records, MD as PCP - Cardiology (Cardiology)  This Provider for this visit: Treatment Team:  Attending Provider: Brand Males, MD    04/16/2020 -   Chief Complaint  Patient presents with   Follow-up    Had covid 3 weeks ago     Interstitial lung disease ( Dr Lauris Chroman and Dr [name])  -IPF [UIP in August 2016 CT scan read locally - April 2021 [facility] - [facility]: [facility] at [name]  read in 2011 and 2016 the odds of the scan being more consistent with hypersensitive pneumonitis is low.  The odds are this is UIP/IPF.  However October 2020 scans from [facility] would be most helpful.  Regardless there is progression and it is appropriate patients on antifibrotic therapy as she is right now] versus   - chronic hypersensitivity pneumonitis [feather pillow exposure and air trapping on CT scan per report]  - started steroids Oct 2017 - [facility] Dr Lauris Chroman  - Dickey Gave Jan 2021 - stopped full dose may 2021 -> since then  176m once daily -> on 1021mtwice daily by Aug 2021  - air purifier since jan 2021  - [facility] MDD: UIP/IPF - no air trapping on Oct 2020 and Oct 2021 CT -> dx is IPF bsed on April 2021 conference and more formally infored to patient 12/26/19   Left diaphragm elevation-chronic and contributing to asymmetry of basal honeycombing  WHO group 3 Pulm Htn 02/13/20  Right Heart Pressures [facility] Procedural Findings: Hemodynamics (mmHg) RA mean 3 RV 45/1 PA 50/16, mean 27 PCWP mean 4 LV 133/10 AO 130/65  Oxygen saturations: PA 70% AO 100%  Cardiac Output (Fick) 3.74  Cardiac Index (Fick) 2.35 PVR 6.1 WU    Hx covid  - opd Rx Feb 2022  HPI [id number]. -returns for follow-up.  Since last visit in December 2020 when she had right heart catheterization and has significant WHO group 3 pulmonary hypertension.  Inhaled treprostinil has been recommended.  She is getting training for it next week and going to start it.  Then in early February 2022 she had COVID-19.  We called in and got her antivirals.  She is feeling much better.  She has some residual fatigue and cough.  However she does not want to do prednisone.  She is 91% on room air at rest but with easy exertion she drops.  In fact on a 6-minute walk test it is deemed that she requires 8 L of oxygen.  She will not qualify for portable system anymore.  Therefore she is going to get this on her own.  For simple  walk 5 L is good enough but for heavy walk she needs 8 L.  So she is going to pay out-of-pocket for the portable innogen system.  We discussed support group.  She finds attending it is the motivating.  She got support from 2 of her friends RoJoseph Berkshirend BeGar Gibbon Both of them are now deceased .  She is sad about it.  She think she will attend the upcoming fibrosis support group meeting via video.  She is tolerating low-dose nintedanib fine.  She is on prednisone at 10 mg.  She is going to start treprostinil next week.  She think she will attend the support group by video and she has a video invitation for that.     OV 10/08/2020  Subjective:  Patient ID: HoMammie Lorenzofemale , DOB: 05/1934-10-24 age 86.o. , MRN: [id number] AD[address]: [address] [place] [id number] GrLavone OrnMD Patient Care Team: GrLavone OrnMD as PCP - General (Internal Medicine) RoFay RecordsMD as PCP - Cardiology (Cardiology)  This Provider for this visit: Treatment Team:  Attending Provider: RaBrand MalesMD    10/08/2020 -   Chief Complaint  Patient presents with   Follow-up    Pt states she has been doing okay since last visit and denies any real complaints.     Interstitial lung disease ( Dr GiLauris Chromannd Dr RaChase Caller -IPF [UIP in August 2016 CT scan read locally - [facility] the odds of the scan being more consistent with hypersensitive pneumonitis is low.  The odds are this is UIP/IPF.  [facility]  Regardless there is progression and it is appropriate patients on antifibrotic therapy as she is right  now] versus   - chronic hypersensitivity pneumonitis [feather pillow exposure and air trapping on CT scan per report]  - [facility]D: UIP/IPF - no air trapping on Oct 2020 and Oct 2021 CT -> dx is IPF bsed on April 2021 conference and  more formally infored to patient 12/26/19  - started [facility] Oct 2017 - [facility] Lauris Chroman  - Dickey Gave Jan 2021 - stopped full dose may 2021 -> since then 151m once daily -> on 1055mtwice daily by Aug 2021  - air purifier since jan 2021  -  Left diaphragm elevation-chronic and contributing to asymmetry of basal honeycombing  WHO group 3 Pulm Htn 02/13/20  - did not tolerate tyvaso  Hx of covid feb 2022  [facility] -returns for her 6-28-monthllow-up.  Last seen in February 2022.  After that she was started on inhaled treprostinil but this did not work well for her.  She had intolerance and she quit.  Right now she is taking prednisone 10 mg/day and also nintedanib 100 mg twice daily.  This is working well for her not much GI side effects.  Weight is overall stable.  She uses 3 L nasal cannula at rest.  She uses 5 L when moving around the house and 7 L for climbing the stairs.  Early morning when she not using her oxygen she sometimes feels blind spots.  She is wondering if it is because of low oxygen.  She is never checked her pulse ox at this time but it only consistently happens early in the morning when she is not on oxygen.  Otherwise fine.  She says she tried to reduce her prednisone to 7 and half milligrams per day but could not tolerate it.  She is satisfied just doing 10 mg.  Her most recent liver function test was in July 2020 and stable.  Symptom score shows slight worsening.  She tells me that she is somewhat reluctant to do a walk test and pulmonary function test.  She would rather monitor with a CT scan of the chest.  Last CT scan was in October 2021.  She is willing to have 1 year CT scan done.  She does not qualify for inhaled nitric oxide pulse study because age is greater than 80.70   SYMPTOM SCALE - ILD Nov 2020 04/22/2019 - ofev since jan 2021  09/23/2019 125# ofev 150m18mily 12/26/2019 126# ofev 100mg20mce daily 04/16/2020 123# 10/08/2020 121# 3L rest 5L simple  ex, 7L more ex  O2 use 4L with ex Portable with exertion  4-5 L at night and 4-5L with exertion, uses o2 75% of the time, 95% RA at rest 91% RA at rest   Shortness of Breath  0 -> 5 scale with 5 being worst (score 6 If unable to do)      At rest 2 0 _0 Simple tasks - showers, clothes change, eating, shaving _1 Household (dishes, doing bed, laundry) 4 3._2 Shopping _3 Walking level at own pace _4 Walking up Stairs _5 Total (30-36) Dyspnea Score 19 11._6 How bad is your cough? 4 2.5 improved with ofev 2.5 better 2 2  How bad is your fatigue _7 ok 1 2  How bad is  nausea  0 0 0 0 0  How bad is vomiting?   0 0 0 0 00  How bad is diarrhea?  0 2 0 0 0  How bad is anxiety?  1 0 0 0 0  How bad is depression  0 0 0 0 0     Simple office walk 250 feet x  3 laps goal with forehead probe 03/08/2018  04/22/2019  09/23/2019  10/08/2020    O2 used Room air Room air ra Did nto do  Number laps completed 3 Only did 2 laps Did 2 laps and desaturated at end of 2nd lap but kept walking to 3.5laps   Comments about pace brisk Mod pace Mod pace   Resting Pulse Ox/HR 99% and 85/min 99% and 86/min 998% and HR 80/min   Final Pulse Ox/HR 90% and 124/min 87% and 119/min 88% and HR 112/min @ end of 2nd laps -> 84% at 3.5 laps without staopping / HR 116/min   Desaturated </= 88% no yes    Desaturated <= 3% points yes yues    Got Tachycardic >/= 90/min yes yes    Symptoms at end of test Moderate dyspnea Mod dyspnea Mo dyspnea   Miscellaneous comments x woprse     PFT  PFT Results Latest Ref Rng & Units 12/16/2019 10/28/2014  FVC-Pre L 1.45 2.19  FVC-Predicted Pre % 60 84  FVC-Post L - 2.18  FVC-Predicted Post % - 84  Pre FEV1/FVC % % 89 90  Post FEV1/FCV % % - 90  FEV1-Pre L 1.30 1.97  FEV1-Predicted Pre % 73 102  FEV1-Post L - 1.96  DLCO uncorrected ml/min/mmHg 7.33 12.16  DLCO UNC% % 40 52  DLCO corrected ml/min/mmHg 7.33 -  DLCO  COR %Predicted % 40 -  DLVA Predicted % 70 80  TLC L - 4.11  TLC % Predicted % - 83  RV % Predicted % - 83       has a past medical history of CHF (congestive heart failure) ([name]), Hemorrhoids, and IPF (idiopathic pulmonary fibrosis) ([place]).   reports that she quit smoking about 33 years ago. Her smoking use included cigarettes. She has a 7.50 pack-year smoking history. She has never used smokeless tobacco.  Past Surgical History:  Procedure Laterality Date   BREAST EXCISIONAL BIOPSY Left    x 2   RIGHT/LEFT HEART CATH AND CORONARY ANGIOGRAPHY N/A 02/13/2020   Procedure: RIGHT/LEFT HEART CATH AND CORONARY ANGIOGRAPHY;  Surgeon: Larey Dresser, MD;  Location: [facility];  Service: Cardiovascular;  Laterality: N/A;   TONSILLECTOMY     age 35    Allergies  Allergen Reactions   5-Alpha Reductase Inhibitors    Prednisone     Hallucination when given in high doses     Immunization History  Administered Date(s) Administered   Influenza Split 12/01/2009, 11/29/2010, 11/17/2014, 11/30/2015, 11/28/2016   Influenza, High Dose Seasonal PF 10/29/2016, 11/15/2018, 12/12/2019   Influenza,inj,Quad PF,6+ Mos 11/05/2014   Influenza-Unspecified 11/28/2013, 11/05/2014, 11/30/2015, 11/15/2018   PFIZER(Purple Top)SARS-COV-2 Vaccination 03/11/2019, 03/30/2019, 10/25/2019   Pneumococcal Conjugate-13 08/08/2013   Pneumococcal Polysaccharide-23 03/28/2005, 02/10/2018, 09/07/2018   Td 03/30/2007   Tdap 08/22/2016   Zoster Recombinat (Shingrix) 09/22/2016, 12/22/2016   Zoster, Live 02/28/2006, 09/22/2016, 12/22/2016    Family History  Problem Relation Age of Onset   Stroke Father    Lung cancer Brother        mets     Current Outpatient Medications:    aspirin 81  MG EC tablet, Take 81 mg by mouth daily. , Disp: , Rfl:    atorvastatin (LIPITOR) 80 MG tablet, Take 1 tablet (80 mg total) by mouth daily., Disp: 90 tablet, Rfl: 3   benzonatate (TESSALON) 200 MG capsule, Take 200 mg by  mouth 2 (two) times daily as needed for cough., Disp: , Rfl:    Calcium Carbonate+Vitamin D 600-200 MG-UNIT TABS, Take 1 tablet by mouth daily., Disp: , Rfl:    Nintedanib (OFEV) 100 MG CAPS, Take 1 capsule (100 mg total) by mouth 2 (two) times daily., Disp: 60 capsule, Rfl: 11   predniSONE (DELTASONE) 10 MG tablet, Take 10 mg by mouth daily in the afternoon., Disp: , Rfl:       Objective:   Vitals:   10/08/20 1120  BP: 116/70  Pulse: 81  Temp: 97.8 F (36.6 C)  TempSrc: Oral  SpO2: 98%  Weight: 121 lb 12.8 oz (55.2 kg)  Height: 5' 4" (1.626 m)    Estimated body mass index is 20.91 kg/m as calculated from the following:   Height as of this encounter: 5' 4" (1.626 m).   Weight as of this encounter: 121 lb 12.8 oz (55.2 kg).  _0 @  Filed Weights   10/08/20 1120  Weight: 121 lb 12.8 oz (55.2 kg)     Physical Exam    General: No distress. Look stsable Neuro: Alert and Oriented x 3. GCS 15. Speech normal Psych: Pleasant Resp:  Barrel Chest - no.  Wheeze - no, Crackles - yes, No overt respiratory distress CVS: Normal heart sounds. Murmurs - no Ext: Stigmata of Connective Tissue Disease - no HEENT: Normal upper airway. PEERL +. No post nasal drip. OXYGEN ON        Assessment:       ICD-10-CM   1. Chronic respiratory failure with hypoxia (HCC)  J96.11     2. IPF (idiopathic pulmonary fibrosis) ([place])  J84.112     3. Current chronic use of systemic steroids  Z79.52     4. WHO group 3 pulmonary arterial hypertension (HCC)  I27.23     5. History of 2019 novel coronavirus disease (COVID-19)  Z86.16     6. Therapeutic drug monitoring  Z51.81     7. Interstitial pulmonary disease ([place])  J84.9 CT Chest High Resolution         Plan:     Patient Instructions     ICD-10-CM   1. Chronic respiratory failure with hypoxia (HCC)  J96.11     2. IPF (idiopathic pulmonary fibrosis) ([place])  J84.112     3. Current chronic use of systemic steroids  Z79.52      4. WHO group 3 pulmonary arterial hypertension (HCC)  I27.23     5. History of 2019 novel coronavirus disease (COVID-19)  Z86.16     6. Therapeutic drug monitoring  Z51.81        Clinically stable with steady gradual progression slowy Glad tolerating ofev 135m twice daily and prednisone 155mdaily Using o2 - 3L at rest, 5L simple things at home and 7L with higher exertion Blind spots on room air early morning could be due to low oxgyen LFT normal 09/16/20  Plan -Continue nintedanib at current dosage 10076mwice daily -Continue prednisone at 10 mg/day  - unable to go lower -Use oxygen as before - No tyvaso due to prior intolerance -Hold off clinical trials as a care option at the moment   Follow-up  -HRCT Nov 2022 (last OCt  2021) - Hld off PFT at your request - REturn to see Dr [name] 30 min followup in Nov 2022 after HRCT    SIGNATURE    Dr. Brand Males, M.D., F.C.C.P,  Pulmonary and Critical Care Medicine Staff Physician, [place] Director - Interstitial Lung Disease  Program  Pulmonary [place] at [place], [place], [address]  Pager: [phone], If no answer or between  15:00h - 7:00h: call [phone] Telephone: [phone]  12:24 PM 10/08/2020

## 2020-10-08 NOTE — Patient Instructions (Addendum)
ICD-10-CM   1. Chronic respiratory failure with hypoxia (HCC)  J96.11     2. IPF (idiopathic pulmonary fibrosis) ([place])  J84.112     3. Current chronic use of systemic steroids  Z79.52     4. WHO group 3 pulmonary arterial hypertension (HCC)  I27.23     5. History of 2019 novel coronavirus disease (COVID-19)  Z86.16     6. Therapeutic drug monitoring  Z51.81        Clinically stable with steady gradual progression slowy Glad tolerating ofev 153m twice daily and prednisone 131mdaily Using o2 - 3L at rest, 5L simple things at home and 7L with higher exertion Blind spots on room air early morning could be due to low oxgyen LFT normal 09/16/20  Plan -Continue nintedanib at current dosage 1002mwice daily -Continue prednisone at 10 mg/day  - unable to go lower -Use oxygen as before - No tyvaso due to prior intolerance -Hold off clinical trials as a care option at the moment   Follow-up  -HRCT Nov 2022 (last OCt 2021) - Hld off PFT at your request - REturn to see Dr RamChase Caller min followup in Nov 2022 after HRCT

## 2020-10-12 DIAGNOSIS — J849 Interstitial pulmonary disease, unspecified: Secondary | ICD-10-CM | POA: Diagnosis not present

## 2020-10-16 ENCOUNTER — Other Ambulatory Visit: Payer: Self-pay | Admitting: Internal Medicine

## 2020-10-16 ENCOUNTER — Other Ambulatory Visit: Payer: Self-pay | Admitting: Radiology

## 2020-10-16 DIAGNOSIS — Z1231 Encounter for screening mammogram for malignant neoplasm of breast: Secondary | ICD-10-CM

## 2020-10-19 DIAGNOSIS — J849 Interstitial pulmonary disease, unspecified: Secondary | ICD-10-CM | POA: Diagnosis not present

## 2020-11-11 NOTE — Telephone Encounter (Signed)
Pt asking about her ct being set up. I let her know the order is in and someone would call her. Just a heads up. Thanks.

## 2020-11-11 NOTE — Telephone Encounter (Signed)
CT order was placed to be done in Nov and MR's ov note states to do in Nov.  We will call pt in Oct to schedule CT for November.  Will route back to triage so they can make pt aware thru [place].

## 2020-11-12 DIAGNOSIS — J849 Interstitial pulmonary disease, unspecified: Secondary | ICD-10-CM | POA: Diagnosis not present

## 2020-11-19 DIAGNOSIS — I700 Atherosclerosis of aorta: Secondary | ICD-10-CM | POA: Diagnosis not present

## 2020-11-19 DIAGNOSIS — J849 Interstitial pulmonary disease, unspecified: Secondary | ICD-10-CM | POA: Diagnosis not present

## 2020-11-19 DIAGNOSIS — J3489 Other specified disorders of nose and nasal sinuses: Secondary | ICD-10-CM | POA: Diagnosis not present

## 2020-11-19 DIAGNOSIS — J84112 Idiopathic pulmonary fibrosis: Secondary | ICD-10-CM | POA: Diagnosis not present

## 2020-11-19 DIAGNOSIS — E7800 Pure hypercholesterolemia, unspecified: Secondary | ICD-10-CM | POA: Diagnosis not present

## 2020-11-19 DIAGNOSIS — I2510 Atherosclerotic heart disease of native coronary artery without angina pectoris: Secondary | ICD-10-CM | POA: Diagnosis not present

## 2020-11-19 DIAGNOSIS — Z0000 Encounter for general adult medical examination without abnormal findings: Secondary | ICD-10-CM | POA: Diagnosis not present

## 2020-11-19 DIAGNOSIS — Z23 Encounter for immunization: Secondary | ICD-10-CM | POA: Diagnosis not present

## 2020-11-19 DIAGNOSIS — Z1389 Encounter for screening for other disorder: Secondary | ICD-10-CM | POA: Diagnosis not present

## 2020-12-02 ENCOUNTER — Other Ambulatory Visit: Payer: Self-pay

## 2020-12-02 ENCOUNTER — Ambulatory Visit
Admission: RE | Admit: 2020-12-02 | Discharge: 2020-12-02 | Disposition: A | Discharge status: Home / Self Care | Payer: Medicare HMO | Source: Ambulatory Visit | Attending: Internal Medicine | Admitting: Internal Medicine

## 2020-12-02 DIAGNOSIS — Z1231 Encounter for screening mammogram for malignant neoplasm of breast: Secondary | ICD-10-CM

## 2020-12-12 DIAGNOSIS — J849 Interstitial pulmonary disease, unspecified: Secondary | ICD-10-CM | POA: Diagnosis not present

## 2020-12-14 ENCOUNTER — Encounter (HOSPITAL_BASED_OUTPATIENT_CLINIC_OR_DEPARTMENT_OTHER): Payer: Self-pay

## 2020-12-14 ENCOUNTER — Other Ambulatory Visit: Payer: Self-pay

## 2020-12-14 ENCOUNTER — Emergency Department (HOSPITAL_BASED_OUTPATIENT_CLINIC_OR_DEPARTMENT_OTHER)
Admission: EM | Admit: 2020-12-14 | Discharge: 2020-12-14 | Disposition: A | Discharge status: Home / Self Care | Payer: Medicare HMO | Attending: Emergency Medicine | Admitting: Emergency Medicine

## 2020-12-14 ENCOUNTER — Emergency Department (HOSPITAL_BASED_OUTPATIENT_CLINIC_OR_DEPARTMENT_OTHER): Payer: Medicare HMO

## 2020-12-14 DIAGNOSIS — Z79899 Other long term (current) drug therapy: Secondary | ICD-10-CM | POA: Diagnosis not present

## 2020-12-14 DIAGNOSIS — S81812A Laceration without foreign body, left lower leg, initial encounter: Secondary | ICD-10-CM

## 2020-12-14 DIAGNOSIS — Z87891 Personal history of nicotine dependence: Secondary | ICD-10-CM | POA: Insufficient documentation

## 2020-12-14 DIAGNOSIS — Y929 Unspecified place or not applicable: Secondary | ICD-10-CM | POA: Insufficient documentation

## 2020-12-14 DIAGNOSIS — Y939 Activity, unspecified: Secondary | ICD-10-CM | POA: Insufficient documentation

## 2020-12-14 DIAGNOSIS — R600 Localized edema: Secondary | ICD-10-CM | POA: Diagnosis not present

## 2020-12-14 DIAGNOSIS — Z7982 Long term (current) use of aspirin: Secondary | ICD-10-CM | POA: Diagnosis not present

## 2020-12-14 DIAGNOSIS — W1809XA Striking against other object with subsequent fall, initial encounter: Secondary | ICD-10-CM | POA: Insufficient documentation

## 2020-12-14 DIAGNOSIS — Y999 Unspecified external cause status: Secondary | ICD-10-CM | POA: Diagnosis not present

## 2020-12-14 MED ORDER — LIDOCAINE-EPINEPHRINE (PF) 2 %-1:200000 IJ SOLN
10.0000 mL | Freq: Once | INTRAMUSCULAR | Status: AC
  Administered 2020-12-14: 10 mL via INTRADERMAL
  Filled 2020-12-14: qty 20

## 2020-12-14 NOTE — ED Triage Notes (Signed)
Pt presents POV with skin tear to her LLE. Pt reports one of her O2 tanks fell on her leg this am.   Upon assessment, pt has a bruising, puncture wound, exposed fatty tissue, tear of 5cm x 7cm   Palpable pulse 2+, chronic pedal edema 2+

## 2020-12-14 NOTE — ED Provider Notes (Signed)
[facility] Provider Note   CSN: [id number] Arrival date & time: 12/14/20  0935     History Chief Complaint  Patient presents with   Extremity Laceration    [name] is a 86 y.o. female.  She presented due to laceration of left lower extremity when her oxygen tank fell on her leg this AM.  She denies feeling sick this morning and states that oxygen tank fell accidentally. She reports decreased sensation in her feet bilaterally at baseline.        Past Medical History:  Diagnosis Date   CHF (congestive heart failure) ([place])    Hemorrhoids    IPF (idiopathic pulmonary fibrosis) ([place])     Patient Active Problem List   Diagnosis Date Noted   Chronic respiratory failure with hypoxia ([place]) 05/24/2019   IPF (idiopathic pulmonary fibrosis) ([place]) 10/20/2014    Past Surgical History:  Procedure Laterality Date   BREAST EXCISIONAL BIOPSY Left    x 2   RIGHT/LEFT HEART CATH AND CORONARY ANGIOGRAPHY N/A 02/13/2020   Procedure: RIGHT/LEFT HEART CATH AND CORONARY ANGIOGRAPHY;  Surgeon: Larey Dresser, MD;  Location: [facility];  Service: Cardiovascular;  Laterality: N/A;   TONSILLECTOMY     age 65     OB History   No obstetric history on file.     Family History  Problem Relation Age of Onset   Stroke Father    Lung cancer Brother        mets    Social History   Tobacco Use   Smoking status: Former    Packs/day: 0.25    Years: 30.00    Pack years: 7.50    Types: Cigarettes    Quit date: 1989    Years since quitting: 33.8   Smokeless tobacco: Never  Substance Use Topics   Alcohol use: Yes    Alcohol/week: 7.0 standard drinks    Types: 7 Shots of liquor per week    Comment: 2 per day   Drug use: No    Home Medications Prior to Admission medications   Medication Sig Start Date End Date Taking? Authorizing Provider  aspirin 81 MG EC tablet Take 81 mg by mouth daily.    Yes [provider]  atorvastatin  (LIPITOR) 80 MG tablet Take 1 tablet (80 mg total) by mouth daily. 08/03/20 08/03/21 Yes Larey Dresser, MD  Nintedanib (OFEV) 100 MG CAPS Take 1 capsule (100 mg total) by mouth 2 (two) times daily. 11/19/19  Yes Brand Males, MD  predniSONE (DELTASONE) 10 MG tablet Take 10 mg by mouth daily in the afternoon.   Yes [provider]  benzonatate (TESSALON) 200 MG capsule Take 200 mg by mouth 2 (two) times daily as needed for cough. 05/01/17   [provider]  Calcium Carbonate+Vitamin D 600-200 MG-UNIT TABS Take 1 tablet by mouth daily.    [provider]    Allergies    5-alpha reductase inhibitors and Prednisone  Review of Systems   Review of Systems  Constitutional: Negative.  Negative for activity change and fatigue.  HENT: Negative.    Respiratory: Negative.    Cardiovascular: Negative.   Gastrointestinal: Negative.   Skin:  Positive for wound.  Neurological: Negative.  Negative for dizziness and syncope.   Physical Exam Updated Vital Signs BP (!) 173/105 (BP Location: Right Arm)   Pulse 90   Temp 97.7 F (36.5 C) (Oral)   Resp 18   Ht _0  (  1.626 m)   Wt 53.5 kg   SpO2 100%   BMI 20.25 kg/m   Physical Exam Constitutional:      Appearance: Normal appearance.  HENT:     Head: Normocephalic and atraumatic.     Mouth/Throat:     Mouth: Mucous membranes are moist.     Pharynx: Oropharynx is clear.  Skin:    General: Skin is warm and dry.     Findings: Lesion present.     Comments: 5 cm by 7cm skin laceration on left lower extremity on the medial surface of her shin.  Laceration is crescent-shaped, horizontal, and involves skin with exposure of underlying adiposity tissue and vessels.  Neurological:     General: No focal deficit present.     Mental Status: She is alert.  Psychiatric:        Mood and Affect: Mood normal.        Behavior: Behavior normal.    ED Results / Procedures / Treatments   Labs (all labs ordered are listed, but  only abnormal results are displayed) Labs Reviewed - No data to display  EKG None  Radiology No results found.  Procedures .Marland KitchenLaceration Repair  Date/Time: 12/14/2020 11:36 AM Performed by: Audley Hinojos, Scientist, research (physical sciences), DO Authorized by: Lucrezia Starch, MD   Consent:    Consent given by:  Patient   Risks, benefits, and alternatives were discussed: yes     Risks discussed:  Infection, pain and poor wound healing   Alternatives discussed:  Referral Universal protocol:    Procedure explained and questions answered to patient or proxy's satisfaction: yes     Relevant documents present and verified: yes     Test results available: yes     Imaging studies available: yes     Required blood products, implants, devices, and special equipment available: no     Site/side marked: yes     Immediately prior to procedure, a time out was called: yes     Patient identity confirmed:  Verbally with patient Anesthesia:    Anesthesia method:  Local infiltration   Local anesthetic:  Lidocaine 2% WITH epi Laceration details:    Location:  Leg   Leg location:  L lower leg   Wound length (cm): 7.   Laceration depth: 2.   Medications Ordered in ED Medications  lidocaine-EPINEPHrine (XYLOCAINE W/EPI) 2 %-1:200000 (PF) injection 10 mL (has no administration in time range)    ED Course  I have reviewed the triage vital signs and the nursing notes.  Pertinent labs & imaging results that were available during my care of the patient were reviewed by me and considered in my medical decision making (see chart for details).    MDM Rules/Calculators/A&P                           Patient is an 86 year old who presented due to laceration on her right lower extremity.  Her wound was approximated using 5 sutures with prolene and steri strips. Xerofoam placed over remaining open wound and wound wrapped with gauze.  Patient advised to follow-up with Primary care provider, Dr. Laurann Montana in 2-3 days to evaluate her  wound and to have sutures removed in 10-14 days.   Final Clinical Impression(s) / ED Diagnoses Final diagnoses:  None    Rx / DC Orders ED Discharge Orders     None        Markan Cazarez, Joellen Jersey, DO 12/14/20 1551  Lucrezia Starch, MD 12/14/20 2101

## 2020-12-14 NOTE — Discharge Instructions (Signed)
Mrs. Doleman,  Please follow-up with your primary care physician within the next few days for evaluation of your leg.  Your bandage will need to be changed at that time.  We placed Xeroform dressing ( this is available over the counter) on the remaining open wound to prevent gauze from sticking to wound.  5 stitches were placed to help hold the skin in place over the wound.  These will need to be removed in 10-14 days.  Thank you for letting us take part in your care.  Christiana Fuchs, DO Madalyn Rob, MD

## 2020-12-14 NOTE — ED Notes (Signed)
ED Provider at bedside.

## 2020-12-14 NOTE — ED Notes (Signed)
XR at bedside

## 2020-12-18 ENCOUNTER — Telehealth: Payer: Self-pay | Admitting: Internal Medicine

## 2020-12-18 DIAGNOSIS — Z9981 Dependence on supplemental oxygen: Secondary | ICD-10-CM | POA: Diagnosis not present

## 2020-12-18 DIAGNOSIS — J84112 Idiopathic pulmonary fibrosis: Secondary | ICD-10-CM

## 2020-12-18 DIAGNOSIS — Z5181 Encounter for therapeutic drug level monitoring: Secondary | ICD-10-CM

## 2020-12-18 DIAGNOSIS — S81812D Laceration without foreign body, left lower leg, subsequent encounter: Secondary | ICD-10-CM | POA: Diagnosis not present

## 2020-12-19 DIAGNOSIS — J849 Interstitial pulmonary disease, unspecified: Secondary | ICD-10-CM | POA: Diagnosis not present

## 2020-12-21 MED ORDER — OFEV 100 MG PO CAPS
100.0000 mg | ORAL_CAPSULE | Freq: Two times a day (BID) | ORAL | 1 refills | Status: AC
Start: 2020-12-21 — End: ?

## 2020-12-21 NOTE — Telephone Encounter (Signed)
Patient would like refill of Ofev. States comes from Laboratory. Patient phone number is [phone].

## 2020-12-21 NOTE — Telephone Encounter (Signed)
[facility] [place] [facility]fev 13m [place] [facility] Patient due for LFTs and CBC check while on Ofev (every 3 months routine monitoring).  Future labs orders placed for hepatic function panel and CBC w diff.  Called patient and advised. She will plan to stop by in the next week  DKnox Saliva PharmD, MPH, BCPS Clinical Pharmacist (Rheumatology and Pulmonology)

## 2020-12-24 DIAGNOSIS — R609 Edema, unspecified: Secondary | ICD-10-CM | POA: Diagnosis not present

## 2020-12-24 DIAGNOSIS — S81802D Unspecified open wound, left lower leg, subsequent encounter: Secondary | ICD-10-CM | POA: Diagnosis not present

## 2020-12-25 ENCOUNTER — Telehealth: Payer: Self-pay | Admitting: Internal Medicine

## 2020-12-28 NOTE — Telephone Encounter (Signed)
Returned call to patient and advised that we do not have Ofev sample. Patient expressed disappointment in our clinic's turnaround time with refills. Advised that refill for Ofev was sent on 12/21/20 and receipt was confirmed by pharmacy. She states she has been without Ofev for 10 days - I advised that pharmacy or [place] may have several hoops to jump through when new refills are sent but apologized for delay and that we will try to respond as quickly as possible  Confirmed with [facility] pharmacist that patient will be receiving medication by 12/29/20 @ 7pm  Knox Saliva, PharmD, MPH, BCPS Clinical Pharmacist (Rheumatology and Pulmonology)

## 2021-01-01 DIAGNOSIS — S81802D Unspecified open wound, left lower leg, subsequent encounter: Secondary | ICD-10-CM | POA: Diagnosis not present

## 2021-01-05 ENCOUNTER — Other Ambulatory Visit: Payer: Self-pay | Admitting: *Deleted

## 2021-01-05 ENCOUNTER — Ambulatory Visit (HOSPITAL_COMMUNITY)
Admission: RE | Admit: 2021-01-05 | Discharge: 2021-01-05 | Disposition: A | Discharge status: Home / Self Care | Payer: Medicare HMO | Source: Ambulatory Visit | Attending: Vascular Surgery | Admitting: Vascular Surgery

## 2021-01-05 ENCOUNTER — Other Ambulatory Visit: Payer: Self-pay

## 2021-01-05 DIAGNOSIS — I739 Peripheral vascular disease, unspecified: Secondary | ICD-10-CM | POA: Diagnosis not present

## 2021-01-06 ENCOUNTER — Encounter: Payer: Self-pay | Admitting: Vascular Surgery

## 2021-01-06 ENCOUNTER — Ambulatory Visit: Payer: Medicare HMO | Admitting: Vascular Surgery

## 2021-01-06 VITALS — BP 108/65 | HR 99 | Temp 98.0°F | Resp 20 | Ht 64.0 in | Wt 120.0 lb

## 2021-01-06 DIAGNOSIS — S91002A Unspecified open wound, left ankle, initial encounter: Secondary | ICD-10-CM

## 2021-01-06 DIAGNOSIS — S81802A Unspecified open wound, left lower leg, initial encounter: Secondary | ICD-10-CM | POA: Diagnosis not present

## 2021-01-06 DIAGNOSIS — S81002A Unspecified open wound, left knee, initial encounter: Secondary | ICD-10-CM

## 2021-01-06 NOTE — Progress Notes (Signed)
Patient ID: [name], female   DOB: 1934/06/28, 86 y.o.   MRN: [id number]  Reason for Consult: New Patient (Initial Visit)   Referred by [name], MD  Subjective:     HPI:  [name] is a 86 y.o. female without previous history of vascular disease.  She did have a wound on the right leg in the past healed with the help of the wound care center.  3 weeks ago she did injure her leg and had a large skin tear.  This was sutured in the emergency department.  Has been very slow to heal.  She does have chronic leg swelling she is on home oxygen.  She does have known heart failure.  She cannot wear compression stockings due to her inability to place them on an her very thin skin.  Past Medical History:  Diagnosis Date   CHF (congestive heart failure) ([facility])    Hemorrhoids    IPF (idiopathic pulmonary fibrosis) ([facility])    Family History  Problem Relation Age of Onset   Stroke Father    Lung cancer Brother        mets   Past Surgical History:  Procedure Laterality Date   BREAST EXCISIONAL BIOPSY Left    x 2   RIGHT/LEFT HEART CATH AND CORONARY ANGIOGRAPHY N/A 02/13/2020   Procedure: RIGHT/LEFT HEART CATH AND CORONARY ANGIOGRAPHY;  Surgeon: Larey Dresser, MD;  Location: [facility];  Service: Cardiovascular;  Laterality: N/A;   TONSILLECTOMY     age 86    Short Social History:  Social History   Tobacco Use   Smoking status: Former    Packs/day: 0.25    Years: 30.00    Pack years: 7.50    Types: Cigarettes    Quit date: 1989    Years since quitting: 33.8   Smokeless tobacco: Never  Substance Use Topics   Alcohol use: Yes    Alcohol/week: 7.0 standard drinks    Types: 7 Shots of liquor per week    Comment: 2 per day    Allergies  Allergen Reactions   5-Alpha Reductase Inhibitors    Prednisone     Hallucination when given in high doses     Current Outpatient Medications  Medication Sig Dispense Refill   aspirin 81 MG EC tablet Take 81 mg by  mouth daily.      atorvastatin (LIPITOR) 80 MG tablet Take 1 tablet (80 mg total) by mouth daily. 90 tablet 3   benzonatate (TESSALON) 200 MG capsule Take 200 mg by mouth 2 (two) times daily as needed for cough.     Calcium Carbonate+Vitamin D 600-200 MG-UNIT TABS Take 1 tablet by mouth daily.     Nintedanib (OFEV) 100 MG CAPS Take 1 capsule (100 mg total) by mouth 2 (two) times daily. 180 capsule 1   predniSONE (DELTASONE) 10 MG tablet Take 10 mg by mouth daily in the afternoon.     No current facility-administered medications for this visit.    Review of Systems  Constitutional:  Constitutional negative. HENT: HENT negative.  Eyes: Eyes negative.  Respiratory: Respiratory negative.  Cardiovascular: Positive for leg swelling.  GI: Gastrointestinal negative.  Musculoskeletal: Positive for leg pain.  Skin: Positive for wound.  Neurological: Neurological negative. Hematologic: Hematologic/lymphatic negative.  Psychiatric: Psychiatric negative.       Objective:  Objective   Vitals:   01/06/21 1056  BP: 108/65  Pulse: 99  Resp: 20  Temp: 98 F (36.7  C)  SpO2: 95%  Weight: 120 lb (54.4 kg)  Height: _0  (1.626 m)   Body mass index is 20.6 kg/m.  Physical Exam Constitutional:      Comments: On home nasal cannula  HENT:     Head: Normocephalic.     Nose:     Comments: Wearing a mask Eyes:     Pupils: Pupils are equal, round, and reactive to light.  Cardiovascular:     Pulses:          Popliteal pulses are 2+ on the right side and 2+ on the left side.     Comments: I cannot reliably feel posterior tibial or dorsalis pedis pulses but this is complicated by edema Pulmonary:     Effort: Pulmonary effort is normal.  Abdominal:     General: Abdomen is flat.     Palpations: Abdomen is soft.  Musculoskeletal:     Right lower leg: Edema present.     Left lower leg: Edema present.  Skin:    Capillary Refill: Capillary refill is delayed bilaterally    Comments: As  pictured below  Neurological:     General: No focal deficit present.     Mental Status: She is alert.  Psychiatric:        Mood and Affect: Mood normal.        Behavior: Behavior normal.        Thought Content: Thought content normal.     Data: ABI Findings:  +---------+------------------+-----+--------+--------+  Right    Rt Pressure (mmHg)IndexWaveformComment   +---------+------------------+-----+--------+--------+  Brachial 163                                      +---------+------------------+-----+--------+--------+  PTA      170               1.02                   +---------+------------------+-----+--------+--------+  DP       158               0.95                   +---------+------------------+-----+--------+--------+  Great Toe16                0.10                   +---------+------------------+-----+--------+--------+   +---------+------------------+-----+---------+-----------------------+  Left     Lt Pressure (mmHg)IndexWaveform Comment                  +---------+------------------+-----+---------+-----------------------+  Brachial 166                                                      +---------+------------------+-----+---------+-----------------------+  PTA                             triphasicPatient would not allow  +---------+------------------+-----+---------+-----------------------+  DP                              triphasicpressure to be taken     +---------+------------------+-----+---------+-----------------------+  Rella Larve  0.00          left lower leg           +---------+------------------+-----+---------+-----------------------+   +-------+-------------------+-----------+------------+------------+  ABI/TBIToday's ABI        Today's TBIPrevious ABIPrevious TBI  +-------+-------------------+-----------+------------+------------+  Right  1.02                0.10                                 +-------+-------------------+-----------+------------+------------+  Left   No ABI Triphasic WF0.00                                 +-------+-------------------+-----------+------------+------------+          Assessment/Plan:     86 year old female with recent history of low-grade trauma to the left leg with a large skin tear.  This does not appear to be infected in fact appears to be somewhat healing well.  There is a high risk that the skin flap will necrosis.  More than anything I think she needs compression to get the edema out of the leg to assist in healing.  She would likely benefit from [employer] and I will refer her to the wound care center where she has been seen before for a right leg wound.  She does have delayed capillary refill but certainly triphasic waveforms at the ankles bilaterally with palpable popliteal pulses should be adequate for healing her wound.  I do not think her swelling is related to venous reflux at this time and certainly the injury although located on the medial leg is related to trauma and not any chronic venous insufficiency issues.  I have placed a low-grade compression wrap today with hydrogel dressing I can stay for 48 hours and then she can go back to her dressing changes as before and hopefully we can get her scheduled in the wound care center soon.     Waynetta Sandy MD [facility]

## 2021-01-11 ENCOUNTER — Ambulatory Visit (INDEPENDENT_AMBULATORY_CARE_PROVIDER_SITE_OTHER)
Admission: RE | Admit: 2021-01-11 | Discharge: 2021-01-11 | Disposition: A | Discharge status: Home / Self Care | Payer: Medicare HMO | Source: Ambulatory Visit | Attending: Internal Medicine | Admitting: Internal Medicine

## 2021-01-11 ENCOUNTER — Other Ambulatory Visit: Payer: Self-pay

## 2021-01-11 DIAGNOSIS — J849 Interstitial pulmonary disease, unspecified: Secondary | ICD-10-CM

## 2021-01-11 DIAGNOSIS — J479 Bronchiectasis, uncomplicated: Secondary | ICD-10-CM | POA: Diagnosis not present

## 2021-01-11 DIAGNOSIS — I2510 Atherosclerotic heart disease of native coronary artery without angina pectoris: Secondary | ICD-10-CM | POA: Diagnosis not present

## 2021-01-11 DIAGNOSIS — J84112 Idiopathic pulmonary fibrosis: Secondary | ICD-10-CM | POA: Diagnosis not present

## 2021-01-11 DIAGNOSIS — I358 Other nonrheumatic aortic valve disorders: Secondary | ICD-10-CM | POA: Diagnosis not present

## 2021-01-12 DIAGNOSIS — J849 Interstitial pulmonary disease, unspecified: Secondary | ICD-10-CM | POA: Diagnosis not present

## 2021-01-12 NOTE — Telephone Encounter (Signed)
Pulmonary fibrosis is stable x 1 y ear Last LFT July 2022  Plan  - continue ofev as before (what is her dose currently)? - she needs LFT check aytime now - next few /several weeks - can do video visit in Jan 2023 but blood work needed    CT Chest High Resolution  Result Date: 01/12/2021 CLINICAL DATA:  Interstitial lung disease EXAM: CT CHEST WITHOUT CONTRAST TECHNIQUE: Multidetector CT imaging of the chest was performed following the standard protocol without intravenous contrast. High resolution imaging of the lungs, as well as inspiratory and expiratory imaging, was performed. COMPARISON:  12/19/2019, 12/10/2018 10/27/2014 FINDINGS: Cardiovascular: Aortic atherosclerosis. Aortic valve calcifications. Mild cardiomegaly. Three-vessel coronary artery calcifications. No pericardial effusion. Enlargement of the main pulmonary artery measuring up to 3.6 cm in caliber for Mediastinum/Nodes: No enlarged mediastinal, hilar, or axillary lymph nodes. Mild tracheobronchomalacia on expiratory phase imaging. Thyroid gland and esophagus demonstrate no significant findings. Lungs/Pleura: Redemonstrated moderate to severe pulmonary fibrosis in a pattern with apical to basal gradient, asymmetrically severe on the left, irregular peripheral interstitial opacity, septal thickening, traction bronchiectasis, and extensive honeycombing throughout the bilateral lung bases and left upper lobe. There is no significant appreciable change in comparison to prior examination dated 12/20/2019, however findings are significantly worsened over a longer period of time on examinations dating back to 10/27/2014. Mild, lobular air trapping on expiratory phase imaging. No pleural effusion or pneumothorax. Upper Abdomen: No acute abnormality. Musculoskeletal: No chest wall mass or suspicious bone lesions identified. IMPRESSION: 1. Redemonstrated moderate to severe pulmonary fibrosis in a pattern with apical to basal gradient,  asymmetrically severe on the left, irregular peripheral interstitial opacity, septal thickening, traction bronchiectasis, and extensive honeycombing throughout the bilateral lung bases and left upper lobe. There is no significant appreciable change in comparison to prior examination dated 12/20/2019, however findings are significantly worsened over a longer period of time on examinations dating back to 10/27/2014. Findings remain consistent with UIP per consensus guidelines: Diagnosis of Idiopathic Pulmonary Fibrosis: An Official ATS/ERS/JRS/ALAT Clinical Practice Guideline. [place], Iss 5, [phone], Oct 29 2016. 2. Mild, lobular air trapping on expiratory phase imaging, suggestive of small airways disease. 3. Mild tracheobronchomalacia on expiratory phase imaging. 4. Coronary artery disease. 5. Aortic valve calcifications. Correlate for echocardiographic evidence of aortic valve dysfunction. 6. Enlargement of the main pulmonary artery, as can be seen in pulmonary hypertension. Aortic Atherosclerosis (ICD10-I70.0). Electronically Signed   By: Delanna Ahmadi M.D.   On: 01/12/2021 09:51     PFT Results Latest Ref Rng & Units 12/16/2019 10/28/2014  FVC-Pre L 1.45 2.19  FVC-Predicted Pre % 60 84  FVC-Post L - 2.18  FVC-Predicted Post % - 84  Pre FEV1/FVC % % 89 90  Post FEV1/FCV % % - 90  FEV1-Pre L 1.30 1.97  FEV1-Predicted Pre % 73 102  FEV1-Post L - 1.96  DLCO uncorrected ml/min/mmHg 7.33 12.16  DLCO UNC% % 40 52  DLCO corrected ml/min/mmHg 7.33 -  DLCO COR %Predicted % 40 -  DLVA Predicted % 70 80  TLC L - 4.11  TLC % Predicted % - 83  RV % Predicted % - 83

## 2021-01-12 NOTE — Telephone Encounter (Signed)
Received the following message from patient:   "Dr. [name],  I am not surprised by the results of the scan.   I am assuming I just enjoy each day as there is really nothing that can be done.  The good news is there does not seem to be much change in the last year.   Please let me know if you want to see me in the office or talk on the phone. Could your office make sure that Dr. Aundra Dubin gets the results of this last test. Thank you.  [facility]"  Patient completed her HRCT yesterday and it was read by the radiologist this morning. MR, can you please advise? Thanks!

## 2021-01-14 DIAGNOSIS — I890 Lymphedema, not elsewhere classified: Secondary | ICD-10-CM | POA: Diagnosis not present

## 2021-01-14 DIAGNOSIS — I872 Venous insufficiency (chronic) (peripheral): Secondary | ICD-10-CM | POA: Diagnosis not present

## 2021-01-14 DIAGNOSIS — Z7952 Long term (current) use of systemic steroids: Secondary | ICD-10-CM | POA: Diagnosis not present

## 2021-01-14 DIAGNOSIS — J849 Interstitial pulmonary disease, unspecified: Secondary | ICD-10-CM | POA: Diagnosis not present

## 2021-01-14 DIAGNOSIS — T8133XA Disruption of traumatic injury wound repair, initial encounter: Secondary | ICD-10-CM | POA: Diagnosis not present

## 2021-01-19 DIAGNOSIS — J849 Interstitial pulmonary disease, unspecified: Secondary | ICD-10-CM | POA: Diagnosis not present

## 2021-01-20 DIAGNOSIS — J849 Interstitial pulmonary disease, unspecified: Secondary | ICD-10-CM | POA: Diagnosis not present

## 2021-01-20 DIAGNOSIS — Z7952 Long term (current) use of systemic steroids: Secondary | ICD-10-CM | POA: Diagnosis not present

## 2021-01-20 DIAGNOSIS — T8133XA Disruption of traumatic injury wound repair, initial encounter: Secondary | ICD-10-CM | POA: Diagnosis not present

## 2021-01-20 DIAGNOSIS — I872 Venous insufficiency (chronic) (peripheral): Secondary | ICD-10-CM | POA: Diagnosis not present

## 2021-01-20 DIAGNOSIS — I890 Lymphedema, not elsewhere classified: Secondary | ICD-10-CM | POA: Diagnosis not present

## 2021-01-27 DIAGNOSIS — I872 Venous insufficiency (chronic) (peripheral): Secondary | ICD-10-CM | POA: Diagnosis not present

## 2021-01-27 DIAGNOSIS — Z7952 Long term (current) use of systemic steroids: Secondary | ICD-10-CM | POA: Diagnosis not present

## 2021-01-27 DIAGNOSIS — I890 Lymphedema, not elsewhere classified: Secondary | ICD-10-CM | POA: Diagnosis not present

## 2021-01-27 DIAGNOSIS — T8133XA Disruption of traumatic injury wound repair, initial encounter: Secondary | ICD-10-CM | POA: Diagnosis not present

## 2021-01-27 DIAGNOSIS — J849 Interstitial pulmonary disease, unspecified: Secondary | ICD-10-CM | POA: Diagnosis not present

## 2021-02-03 DIAGNOSIS — J849 Interstitial pulmonary disease, unspecified: Secondary | ICD-10-CM | POA: Diagnosis not present

## 2021-02-03 DIAGNOSIS — T8133XA Disruption of traumatic injury wound repair, initial encounter: Secondary | ICD-10-CM | POA: Diagnosis not present

## 2021-02-03 DIAGNOSIS — Z7952 Long term (current) use of systemic steroids: Secondary | ICD-10-CM | POA: Diagnosis not present

## 2021-02-03 DIAGNOSIS — I739 Peripheral vascular disease, unspecified: Secondary | ICD-10-CM | POA: Insufficient documentation

## 2021-02-03 DIAGNOSIS — I872 Venous insufficiency (chronic) (peripheral): Secondary | ICD-10-CM | POA: Diagnosis not present

## 2021-02-03 DIAGNOSIS — I890 Lymphedema, not elsewhere classified: Secondary | ICD-10-CM | POA: Diagnosis not present

## 2021-02-03 DIAGNOSIS — L97912 Non-pressure chronic ulcer of unspecified part of right lower leg with fat layer exposed: Secondary | ICD-10-CM | POA: Insufficient documentation

## 2021-02-08 ENCOUNTER — Telehealth: Payer: Self-pay | Admitting: Internal Medicine

## 2021-02-08 MED ORDER — BENZONATATE 200 MG PO CAPS: 200.0000 mg | ORAL_CAPSULE | Freq: Two times a day (BID) | ORAL | 1 refills | Status: AC | PRN

## 2021-02-08 NOTE — Telephone Encounter (Signed)
Called and spoke with patient. She was requesting a refill on her Tessalon perles to be sent to [facility] on [address]. This has been taken care of.   While on the phone, she stated that she wanted to speak directly with MR. She would not elaborate on what she needed to discuss. She can be reached at [phone] or [phone].   MR, please advise. Thanks.

## 2021-02-08 NOTE — Telephone Encounter (Signed)
Called patient but she did not answer. Left message for her to call us back.

## 2021-02-08 NOTE — Telephone Encounter (Signed)
Are you able to put her in for a telephone/video visit 02/10/21 at r noon?

## 2021-02-09 ENCOUNTER — Encounter: Payer: Self-pay | Admitting: Internal Medicine

## 2021-02-09 ENCOUNTER — Other Ambulatory Visit: Payer: Self-pay

## 2021-02-09 ENCOUNTER — Telehealth (INDEPENDENT_AMBULATORY_CARE_PROVIDER_SITE_OTHER): Payer: Medicare HMO | Admitting: Internal Medicine

## 2021-02-09 DIAGNOSIS — Z5181 Encounter for therapeutic drug level monitoring: Secondary | ICD-10-CM | POA: Diagnosis not present

## 2021-02-09 DIAGNOSIS — R0602 Shortness of breath: Secondary | ICD-10-CM

## 2021-02-09 DIAGNOSIS — J9611 Chronic respiratory failure with hypoxia: Secondary | ICD-10-CM

## 2021-02-09 DIAGNOSIS — I2723 Pulmonary hypertension due to lung diseases and hypoxia: Secondary | ICD-10-CM

## 2021-02-09 DIAGNOSIS — Z7952 Long term (current) use of systemic steroids: Secondary | ICD-10-CM

## 2021-02-09 DIAGNOSIS — Z7189 Other specified counseling: Secondary | ICD-10-CM

## 2021-02-09 DIAGNOSIS — R5381 Other malaise: Secondary | ICD-10-CM

## 2021-02-09 DIAGNOSIS — Z66 Do not resuscitate: Secondary | ICD-10-CM

## 2021-02-09 DIAGNOSIS — J84112 Idiopathic pulmonary fibrosis: Secondary | ICD-10-CM

## 2021-02-09 DIAGNOSIS — R600 Localized edema: Secondary | ICD-10-CM

## 2021-02-09 NOTE — Patient Instructions (Addendum)
ICD-10-CM   1. IPF (idiopathic pulmonary fibrosis) ([place])  J84.112     2. Chronic respiratory failure with hypoxia (HCC)  J96.11     3. WHO group 3 pulmonary arterial hypertension (HCC)  I27.23     4. Current chronic use of systemic steroids  Z79.52     5. Therapeutic drug monitoring  Z51.81     6. Pedal edema  R60.0     7. Physical deconditioning  R53.81     8. Advance care planning  Z71.89     9. DNR (do not resuscitate)  Z66       Clinically stable with steady gradual progression slowly and definitely worsening fatigue Functional decline in quality of life Fatigue Glad tolerating ofev 17m twice daily and prednisone 174mdaily Using o2 - titrate for pulse ox > 88% LFT normal 09/16/20 Agree on doing some basic tests for fatigue  Plan - refer Home Palliative care  - depending on course, can always escalate to hospice -Continue nintedanib at current dosage 10041mwice daily -Continue prednisone at 10 mg/day -Use oxygen as before - No tyvaso due to prior intolerance -Hold off clinical trials as a care option at the moment - DNR with basic medical care - Ask pallitivecare to fill out MOST FORM - check CBC, BMET, LFT, HgbA1C, TSH,  FT4 , BNP, troponin, - this week   Follow-up  -video visit in Jan 2023 first available

## 2021-02-09 NOTE — Telephone Encounter (Signed)
Ok please add for video or phone call later today. Winter schedule super busy. If this wont work today I can try to call her later in week

## 2021-02-09 NOTE — Progress Notes (Signed)
IOV 03/21/2017  Chief Complaint  Patient presents with   Advice Only    Former RA pt. Pt states she has been going to [facility] and is on 3-4L O2 with exertion and sometimes at night. Has SOB mainly with exertion.    Melat P Maruyama 86 y.o. female with interstitial lung disease.  History is gained from talking to her and review of the chart.  She was originally diagnosed with interstitial lung disease by Dr. Kara Mead in our practice.  Subsequently she transferred her care to [facility] Dr. [name] who has been following her.  Review of the chart shows the differential diagnosis is IPF versus chronic hypersensitivity pneumonitis based on some air trapping done in her high-resolution CT scan of the chest in 2017/2018.  She is known to have chronic stable interstitial lung disease on previous imaging dating as back as 2008 but she is only aware of her interstitial lung disease in the recent few years.  There is excellent documentation in Dr. [name] medical notes.  It appears based on the goals of care of the patient and her exertional desaturation that surgical lung biopsy was deferred and anti-fibrotic therapy was deferred.  Patient confirms the same.  She is extremely physically fit and thus good aerobic activity on 4 L of oxygen outpacing people much younger than her.  It appears over many years has been a gradual decline in her fibrosis and pulmonary health but she still extremely functional and does well.  She is mainly here today to have a pulmonary fibrosis resource in [place], [place].  She sees Dr. Lauris Chroman every 4 months.  She prefers to see Dr. Lauris Chroman and do all her monitoring in terms of her 6-minute walk test, pulmonary function test and her high-resolution CT scan at [facility] because she is very comfortable with the personnel there.  Yet because she is local in [place] she wants to tap into the resources here in case of an emergency.  We discussed  about alternating her follow-up between myself and Dr. Lauris Chroman every 4 months which would make her visit to either one place every 8 months and she seemed to think this was a good plan.  At this point as she is stable and she is satisfied with all pillars of our care.  She is not interested in pulmonary rehabilitation because rightfully so she exercises pretty actively.  She has been the patient support group and did not find value in it.  She is not a transplant candidate.  And she is deferred anti-fibrotic therapy.  She might or might not be interested in research trial and this was not discussed.  It appears the only new issue other than her overall stability is that for the last 1 year she has mild random dysphagia although no active acid reflux.  This can happen for liquids or solids or tablets and is very random.  She has not seen a gastroenterologist for this.  She is not on any proton pump inhibitors.  In addition she tells me she uses of feather down pillow all her life.  She is also use talcum powder all her life and with the recent lawsuit against [employer] about talcum powder she wonders if this could be contributory to her pulmonary fibrosis.  Otherwise [employer] of chest physicians interstitial lung disease questionnaire is positive for cigarette smoking from age 42 when she smoked a pack a week to age 65.  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  She did recently try a course of prednisone through Dr. Lauris Chroman for her interstitial lung disease but she did not seem to benefit from this and this is been slowly weaned off.  Her autoimmune antibody at [facility] and in [place] was normal.  Pulmonary function test from [facility] was reviewed and the images of CT scan of the chest done in [place] visualized  Walking desaturation test on 03/21/2017 185 feet x 3 laps on ROOM AIR:  did not desaturate. Rest pulse ox was 100%, final pulse ox was 88%. HR response  71/min at rest to 120/min at peak exertion. Patient Jayelyn P Lobello  Did yes Desaturate </= 88% . Kameren P Cleaver yes  Desaturated </= 3% points. Anabela P Lozon yes did get tachyardic  IMPRESSION: 1. Pulmonary parenchymal pattern of fibrosis is progressive, most indicative of usual interstitial pneumonitis (UIP). 2. Coronary artery calcification.     Electronically Signed   By: Lorin Picket M.D.   On: 10/27/2014 15:51 OV 06/21/2017   Chief Complaint  Patient presents with   Follow-up    Pt saw Dr. Lauris Chroman 2/4.  Pt states she has been doing well up until 10 days ago when she developed a cold and is coughing, postnasal drainage and some mild chest tightness.   Shera P Sanks presents for follow-up of interstitial lung disease [IPF most likely versus chronic HP less likely],. Is supportive care. She follows mostly at [facility] with Dr. [name]. I am her local resource. This is a scheduled appointment. Last seen by Dr. Lauris Chroman in February 2019. Notes reviewed and patient also confirms the same. It appears that without low-dose prednisone her cough relapsed. Therefore she went back on 5 mg prednisone and she was doing well in terms of her cough. However 10 days ago she developed a runny nose and cold and since then has increased cough compared to baseline. She also has a nasal twang. This a slight increase in sputum production compared to baseline and the sputum is off color. This is not normal for her. The might be some increased wheezing but his sleep quality is good. Overall effort tolerance continues to be good. She exercises with oxygen. At room air at rest she does not need oxygen. Most recently in February 2019 her ILD was deemed stable by Dr. Lauris Chroman. She[place] go to the beach in [place] few days she wants treatment recommendation for the current deterioration. She does not want to do high dose prednisone because of fear of hallucinationsthat have happened in the past. She also now wants  to alternate pulmonary fibrosis follow-up between [facility] and myself.   She also last several months c/o loss of taste and asking for directions in resolving this issue      OV 01/23/2018  Subjective:  Patient ID: [name], female , DOB: Dec 02, 1934 , age 46 y.o. , MRN: [id number] , ADDRESS: [place] [address]   01/23/2018 -   Chief Complaint  Patient presents with   Follow-up    States her breathing has been at her baseline. Denies chest pain or chest discomfort. States tesslon helps but it is too expensive.      HPI [facility] 86 y.o. -returns for follow-up of her interstitial lung disease that has a differential diagnosis between IPF and chronic hypersensitivity pneumonitis.  After seeing me last she saw Dr. Lauris Chroman in August 2019.  Since then she has  been doing stable.  I reviewed those notes.  Compared to recent visits her CT scan is stable and her walk test is stable.  She is able to manage with 2 L oxygen.  She has a portable system.  She does feel that she is unable to do more than she would like to do because of her ILD.  She does admit that compared to 3 years ago she says progressive ILD.  She also has significant cough for which she likes Ladona Ridgel but she says insurance is making him pay $3 a pill and so she is upset about that.  Review of immunization record shows that she has not had Pneumovax or flu shot this year but she says she is up-to-date.  We discussed the new [employer] trial for progressive non-IPF ILD where there was beneficial effect of nintedanib.  We discussed this and she is willing to try this after the new year 2020.  She wants to make sure that her [place] ILD specialist Dr. Lauris Chroman is on board with this.  We discussed the side effects in detail.    OV 03/08/2018  Subjective:  Patient ID: [name], female , DOB: 01-11-1935 , age 89 y.o. , MRN: [id number] , ADDRESS: [place] [id number]   03/08/2018 -    Chief Complaint  Patient presents with   Follow-up    Pt states she has been well since last visit. States she does become SOB with exertion and does have an occ cough with clear mucus. Denies any CP.    ilD FOLLOWUP   HPI [facility] 86 y.o. -returns for followup. Over xmas 2019 because of social stress increased prednisone to 52m per day but now back to 539mper day. Dyspnea with exergtion continues but stable. Cough that is very bothersome continue.  She takes TeBest boyor this this helps her.  However it is expensive.  She is placed a call to [employer] apparently they are going to try to work out where she can make it more affordable because I ILD is a diagnosis.  She admitted that she has down pillows and feather blankets.  1 of her close friends Mr. TaLovena Leho also has hypersensitivity pneumonitis and sees me advised her to get rid of those and therefore she is in the process of doing that.  She asked about further options and treatment of a cough.  We discussed gabapentin no increasing steroid dose participating in a cough study but she did not want to add these complications to her.  She seems content with the option of trying an inspiratory muscle trainer and seeing if the cough would get better.  Overall given her progressive ILD and the possibility that this is IPF she is willing to now try nintedanib.  We discussed nintedanib extensively.  She denies any heart disease.  She is not on any major anticoagulation.  She does not have any GI issues other than constipation.  She is willing to try nintedanib.  She is nervous about the co-pay and said if the co-pay is too expensive despite charity program then she will not take it.  She [facility].          OV 01/17/2019  Subjective:  Patient ID: HoMammie Lorenzofemale , DOB: [birth year] age 86.o. , MRN: [id number] [address] [address] [id number]  01/17/2019 -   Chief Complaint  Patient presents with   Follow-up    Pt states she has been okay since last visit. Pt states SOB has become worse and is coughing a lot.   Interstitial lung disease-IPF [UIP in August 2016 CT scan read locally] versus second distant possibility chronic hypersensitivity pneumonitis [feather pillow exposure and air trapping on CT scan per report]   HPI [facility] 86 y.o. -last seen January 2020.  Since then in the last 6 months he has declined significantly.  She did see Dr. Lauris Chroman about a month ago at [facility].  I reviewed the chart.  Her shortness of breath with exertion has declined.  With exercise and walking she is using 4 L of oxygen.  Her cough also got worse.  He has increased the prednisone to 10 mg/day.  This is helped her cough.  Nevertheless she randomly gets severe coughing spells that makes her feel that she is going to die.  She is got a facemask oxygen is going to use that.  There was conversation about increasing the prednisone even further according to her history but she declined.  At this point in time she just wants to monitor the situation.  She certainly is open to taking more prednisone if the cough got worse.  She did have a high-resolution CT scan of the chest December 10, 2018 at [facility].  I reviewed the result.  Images not available for visualization.  They report definite persistent air trapping.  They also report some nodular opacities in the left lung.  Overall worsening fibrosis especially on the left greater than the right.  She did have an echocardiogram.  Her symptom scores currently are listed below and it shows significant level of symptoms.  We discussed antifibrotic nintedanib.  She says she is so frustrated because of the cost.  She says she tried a level best to get the cost down but could not.  She is pretty pessimistic that she will not be able to get a discount on the drug.  We discussed about the  fact her disease continues to be progressive and to see if the cost would justify some of the benefit it might give.  She is willing to have me inquire about the cost.  We discussed research as a care option.  We discussed in detail the concept of research being voluntary.  The principles of randomization to placebo.  The need to do study visits.  Inclusion exclusion criteria.  She is willing to go through screening for any protocol for which she might qualify.  This will be possible after January 2020.  She had questions about the Covid vaccine.  She is social distancing.  She is afraid of getting Covid.     OV 04/22/2019  Subjective:  Patient ID: [name], female , DOB: [birth year] , age 57 y.o. , MRN: [id number] , ADDRESS: [place] [address]   04/22/2019 -   Chief Complaint  Patient presents with   Follow-up    Pt states she has been doing well since last visit and states her breathing is stable.   Interstitial lung disease ( Dr Lauris Chroman and Dr [name])  -IPF [UIP in August 2016 CT scan read locally] versus   - chronic hypersensitivity pneumonitis [feather pillow exposure and air trapping on CT scan per report]  - started [facility] Oct 2017 - [facility]is [facility]  - Dickey Gave Jan 2021  - air purifier  since jan 2021   HPI [facility] 86 y.o. -presents with her husband.  She says overall she is doing well.  She is now taking the nintedanib for the last 3 weeks.  She is entering the fourth week of the nintedanib.  She says since she started nintedanib cough is improved all the dyspnea is the same.  She now has air purifier at home.  Review of her symptom score shows improvement in her symptoms.  In terms of her nintedanib tolerance she is tolerating it fine.  She says so far no side effects.  Liver function test March 27, 2019 is normal.  She is interested in research protocols.  On this visit I was able to review her [place] records.  She last saw Dr. Lauris Chroman in October  2020.  Dr. Lauris Chroman was concerned about her progression.  She did have a high-resolution CT chest that I cannot visualize the image myself because it was done at [facility] the report there is one that is more consistent with an alternative diagnosis to UIP and more consistent with chronic hypersensitivity pneumonitis.  As always a debate between IPF versus chronic hypersensitive pneumonitis in Ms. Rober[place] with the weight of the clinical evidence being more towards IPF per [facility] records.  I reviewed our records and so far we have never discussed in our multidisciplinary case conference.  Her walking desaturation test clearly shows deterioration compared to 1 year ago  She also told me a little bit of blurring of vision on and off with prednisone.  She wanted to know safe prednisone dose.  She feels 5 mg of prednisone does not control her symptoms well but 10 mg dose.  Her main new issue - new issue venous stasis RLE.  She has a nonhealing wound ulcer that is over an inch in size.  She is wrapped it with TED stockings and Ace wrap.  I am not able to see the wound.  She sees the wound care here.  She keeps the leg elevated intermittently during the daytime per their advice.  She is frustrated by this.   HRCT oct 2020 [facility]  Impression: 1. Slightly increased left greater than right lung pulmonary fibrosis favored to be due to chronic hypersensitivity pneumonitis. Increased superimposed nodular opacities in the left lung are favored to be related to worsening fibrosis with superimposed infection/malignancy not excluded, attention on follow-up.  Electronically Reviewed by:  Verdell Carmine, MD, [facility] Electronically Reviewed on:  12/10/2018 12:00 PM  I have reviewed the images and concur with the above findings.  Electronically Signed by:  Tessa Lerner, MDPhD,[facility] Electronically Signed on:  12/10/2018 5:39 PM   [facility]cho [facility] oct 2020 INTERPRETATION  --------------------------------------------------------------- NORMAL LEFT VENTRICULAR SYSTOLIC FUNCTION WITH MILD LVH NORMAL RIGHT VENTRICULAR SYSTOLIC FUNCTION VALVULAR REGURGITATION: TRIVIAL MR, TRIVIAL PR, MILD TR NO VALVULAR STENOSIS MILD TO MODERATE TR NO PRIOR STUDY FOR COMPARISON   OV 08/02/2019 - telephone visit. Identified with 2PHI. Called patient on her cell phone 11:58 AM   Subjective:  Patient ID: [name], female , DOB: 10-03-1934 , age 56 y.o. , MRN: [id number] , ADDRESS: [place] [address]  Interstitial lung disease ( Dr Lauris Chroman and Dr [name])  -IPF [UIP in August 2016 CT scan read locally - [facility] read in 2011 and 2016 the odds of the scan being more consistent with hypersensitive pneumonitis is low.  The odds are  this is UIP/IPF.  However October 2020 scans from [facility] would be most helpful.  Regardless there is progression and it is appropriate patients on antifibrotic therapy as she is right now] versus   - chronic hypersensitivity pneumonitis [feather pillow exposure and air trapping on CT scan per report]  - started steroids Oct 2017 - [facility] Dr Lauris Chroman  - Dickey Gave Jan 2021 - stopped full dose may 2021  - air purifier since jan 2021   08/02/2019 -     HPI [facility] 86 y.o. - says she had a  Wound on right lower extremity following a bump in dec 2020. Did not heal. Jeris Penta to wound center for 3 months. Discharged a week ago from followup., DUring that time her RLE was wrapped in compression bandages. But now she is not on that. And since then ankles are swollen. Now she is on "Suppose" which helps. But as she removes it - > leg swelling gets worse. Swelling is bilateral but RLE > LLE.  Wnts to know iof ILD is causing this. Answered that is possible via Nichols mechanism. Reviewed echo from oct 2020- > this did not show RV strain. Currently per her hx - does not sound like cellulitis or DVT (all  chronic and unchanged). Nto currently interested in lasix.   Overall, sicne jan 2021 says ILD is worse and using more o2. Ngiht usingi 4L [place] at night and rest. Sometimes needs 5L [place] with exertion. When she goes to groceries - > takes portable o2 concentrator  Now on ofev 150 mg tablet - taking it once a day since 07/14/19. She is not sur if she can take bid / once daily alternate day as yet. Wants this lower regime for 1 month and then try esclating  Also down to 39m prednisone Continue on tessalon perles  Doing ok on it  Oct 2020> [facility] is not uploaded in our PACT    ROS  -  OV 09/23/2019   Subjective:  Patient ID: HMammie Lorenzo female , DOB: [id number] age 86y.o. years. , MRN: [id number]  [place]: [address] [id number]  GLavone Orn MD Providers : Treatment Team:  Attending Provider: RBrand Males MD   Interstitial lung disease ( Dr GLauris Chromanand Dr RChase Caller  -IPF [UIP in August 2016 CT scan read locally - [facility] the odds of the scan being more consistent with hypersensitive pneumonitis is low.  The odds are this is UIP/IPF.  [facility]  Regardless there is progression and it is appropriate patients on antifibrotic therapy as she is right now] versus   - chronic hypersensitivity pneumonitis [feather pillow exposure and air trapping on CT scan per report]  - started steroids Oct 2017 - [facility] Dr GLauris Chroman - oDickey GaveJan 2021 - stopped full dose may 2021 -> since then 1558monce daily  - air purifier since jan 2021     09/23/2019 -returns for follow-up.  At this visit she tells me that her last [facility].  He is now no longer seeing ILD patients or any patients and therefore she would just will keep a follow-up with me.  She is taking prednisone 10  mg/day although splitting it is 5 mg twice daily.  She says this gives her more energy.  She says she is using 4-5 L at rest and continuously although today when we walked at room air at rest she was fine and she only dropped to 88% after walking 2 laps and went down to 84% after walking 3 laps.  The distance is shown below.  She wants to qualify for at least 10 L oxygen.  I have advised her she may need to come for oxygen titration test.  In terms of her nintedanib she is taking 150 mg once daily.  She tried to go to an average of 200 mg total for the day by taking 150 mg twice daily Monday Wednesday Friday and then taking 150 mg once daily on Tuesday Thursday and Saturday and rotating between the 2 on Sunday.  However she says discussed lot of diarrhea and she stopped it.  Her weight appears stable.  She wants to see if she can escalate to a slightly higher dose on the nintedanib because I have advised her that 150 mg once daily is subtherapeutic.  She does have some foot numbness problems.  I have suggested she talk this with the primary care physician.  Her symptom score and PFT shows a steady progressive decline in her ILD.  Her symptom score shows continued worsening.    OV 12/26/2019   Subjective:  Patient ID: [name], female , DOB: 1934/08/23, age 81 y.o. years. , MRN: [id number],  ADDRESS: [place] [address] PCP  [name], MD Providers : Treatment Team:  Attending Provider: Brand Males, MD Patient Care Team: [name], MD as PCP - General (Internal Medicine)    Interstitial lung disease ( Dr Lauris Chroman and Dr [name])  -IPF [UIP in August 2016 CT scan read locally - [facility] - April 2021 MD[facility] at [facility]: [facility] at [facility] read in 2011 and 2016 the odds of the scan being more consistent with hypersensitive pneumonitis is low.  The odds are this is UIP/IPF.  However October 2020 scans from [facility] would be most helpful.   Regardless there is progression and it is appropriate patients on antifibrotic therapy as she is right now] versus   - chronic hypersensitivity pneumonitis [feather pillow exposure and air trapping on CT scan per report]  - started steroids Oct 2017 - [facility] Lauris Chroman  - Dickey Gave Jan 2021 - stopped full dose may 2021 -> since then 171m once daily -> on 1094mtwice daily by Aug 2021  - air purifier since jan 2021  - [facility]D: UIP/IPF - no air trapping on Oct 2020 and Oct 2021 CT -> dx is IPF bsed on April 2021 conference and more formally infored to patient 12/26/19   Left diaphragm elevation-chronic and contributing to asymmetry of basal honeycombing  Chief Complaint  Patient presents with   Follow-up    Pt states she has been doing okay since last visit. States that she is tolerating the OFEV so far. Pt does still have SOB but states her cough is better.       [facility] for follow-up with her husband.  At this visit multiple issues to discuss.  At this point in time in terms of therapy she is on nintedanib.  She is able to do 100 mg twice daily since August 2021.  She is not having much side effects.  In terms of symptoms on the symptom score she  seems stable but she tells me that she is actually doing less and less she is not able to exercise anymore because of hypoxemia.  She uses oxygen 75% of the time.  She uses 4-5 L at night while sleeping and 4-5 L with portable system when exerting.  She walks of the driveway and she would desaturate to less than 88%.  She states she is restricted her exertion but she still does go around to the grocery store with the oxygen.  Her weight is stable on nintedanib at 126 pounds.  Today she will have liver function tests  In terms of her disease severity: She had pulmonary function test we reviewed this that shows steady progression over time.  She also had high-resolution CT chest that is definitely progressed compared to last year.   Again our radiologist feels quite confidently that the pattern is definitely for UIP.  There is no suggestion of alternative diagnosis such as hypersensitive pneumonitis.  I went over this with the patient and said that we would formally classify her as IPF.  Because of this diagnosis we then discussed the prednisone intake which she understands was started for possibility of hypersensitive pneumonitis.  Explained to her that our radiologist are not seeing air trapping.  She does say that the prednisone does make her feel better and when she tapers that she could feel worse.  Now that she is willing to try to reduce the prednisone based on data that in IPF chronic prednisone could potentially be harmful but overall she says that she has tolerated prednisone quite well.  We went over the general side effects of prednisone including bone health.  She is worried about her chronic pedal edema.  This is getting worse.  Foot elevation is not helping.  She says compression stockings can be very painful for her.  In the past she was on diuretic therapy.  She is willing to restart this.  She does not understand the pathologic physiologic basis for this.  We went over pulmonary hypertension, venous stasis skin bruising aging and venous incompetency.  We discussed the possibility of modulating pulmonary artery pressures with the help of inhaled nitric oxide as a trial inhaled treprostinil as standard of care or as a trial.  This will be dependent on cardiac data.  I informed her that we will discuss this more in the future as we get more information  She is also worried about her recent CT findings that shows aortic valve calcifications and coronary artery calcifications.  She does not have chest pain but she says this description is new to her.  Her last echocardiogram was over a year ago at [facility].  I reviewed the records could not find it.  She does not have any chest pain she is not seen a cardiologist.  She  never had a stress test.  She is willing to go through work-up for this.       Results for ETHYLENE, REZNICK (MRN [id number]) as of 01/23/2018 17:05  Ref. Range 10/28/2014 16:39 10[facility]2017 [facility] 12[facility]2018 [facility] 12[facility]16[facility]19  FVC-Pre Latest Units: L 2.19 1.85 1.64 1.58L 1.45  FVC-%Pred-Pre Latest Units: % 84 69% 66  60%   Results for CRISTELA, STALDER (MRN [id number]) as of 01/23/2018 17:05  Ref. Range 10/28/2014 16:39 01/23/2018  12/10/2018 4.4/21 12/16/19 - GLI equation  DLCO unc Latest Units: ml/min/mmHg 12.16 8.36 5.68 6.6 7.33  DLCO unc % pred Latest Units: % 52 51% 30  40%     CLINICAL DATA:  86 year old female with history of interstitial lung disease. Worsening oxygenation and shortness of breath since February.  CT CHEST 12/19/19   EXAM: CT CHEST WITHOUT CONTRAST   TECHNIQUE: Multidetector CT imaging of the chest was performed following the standard protocol without intravenous contrast. High resolution imaging of the lungs, as well as inspiratory and expiratory imaging, was performed.   COMPARISON:  Chest CT 12/10/2018.   FINDINGS: Cardiovascular: Heart size is normal. There is no significant pericardial fluid, thickening or pericardial calcification. There is aortic atherosclerosis, as well as atherosclerosis of the great vessels of the mediastinum and the coronary arteries, including calcified atherosclerotic plaque in the left main, left anterior descending, left circumflex and right coronary arteries. Calcifications of the aortic valve.   Mediastinum/Nodes: No pathologically enlarged mediastinal or hilar lymph nodes. Please note that accurate exclusion of hilar adenopathy is limited on noncontrast CT scans. Esophagus is unremarkable in appearance. No axillary lymphadenopathy.   Lungs/Pleura: High-resolution images demonstrate widespread areas of septal thickening, subpleural reticulation, traction bronchiectasis, parenchymal banding and extensive  honeycombing. Findings are asymmetric (left greater than right). In the right lung, there is a definitive craniocaudal gradient. Overall, findings are progressive compared to prior study from 2020. No acute consolidative airspace disease. No pleural effusions. No definite suspicious appearing pulmonary nodules or masses are noted.   Upper Abdomen: Elevation of the left hemidiaphragm. Aortic atherosclerosis.   Musculoskeletal: There are no aggressive appearing lytic or blastic lesions noted in the visualized portions of the skeleton.   IMPRESSION: 1. Progressive worsening interstitial lung disease, with a spectrum of findings considered diagnostic of usual interstitial pneumonia (UIP) per current ATS guidelines. The asymmetry of the findings (left greater than right) may be related to chronic left hemidiaphragm paralysis. 2. Aortic atherosclerosis, in addition to left main and 3 vessel coronary artery disease. 3. There are calcifications of the aortic valve. Echocardiographic correlation for evaluation of potential valvular dysfunction may be warranted if clinically indicated.   Aortic Atherosclerosis (ICD10-I70.0).     Electronically Signed   By: Vinnie Langton M.D.   On: 12/20/2019 06:58    OV 04/16/2020  Subjective:  Patient ID: [name], female , DOB: [birth year] , age 17 y.o. , MRN: [id number] , ADDRESS: [place] [address] PCP [name], MD Patient Care Team: [name], MD as PCP - General (Internal Medicine) Fay Records, MD as PCP - Cardiology (Cardiology)  This Provider for this visit: Treatment Team:  Attending Provider: Brand Males, MD    04/16/2020 -   Chief Complaint  Patient presents with   Follow-up    Had covid 3 weeks ago     Interstitial lung disease ( Dr Lauris Chroman and Dr [name])  -IPF [UIP in August 2016 CT scan read locally - [facility] - [facility]  read in 2011 and 2016 the odds of the scan being more consistent with hypersensitive pneumonitis is low.  The odds are this is UIP/IPF.  However October 2020 scans from [facility] would be most helpful.  Regardless there is progression and it is appropriate patients on antifibrotic therapy as she is right now] versus   - chronic hypersensitivity pneumonitis [feather pillow exposure and air trapping on CT scan per report]  - started [facility] Oct 2017 - [facility] Lauris Chroman  - Dickey Gave Jan 2021 - stopped full dose may 2021 -> since then  161m once daily -> on 1022mtwice daily by Aug 2021  - air purifier since jan 2021  - [facility]: UIP/IPF - no air trapping on Oct 2020 and Oct 2021 CT -> dx is IPF bsed on April 2021 conference and more formally infored to patient 12/26/19   Left diaphragm elevation-chronic and contributing to asymmetry of basal honeycombing  WHO group 3 Pulm Htn 02/13/20  Right Heart Pressures R[facility] Procedural Findings: Hemodynamics (mmHg) RA mean 3 RV 45/1 PA 50/16, mean 27 PCWP mean 4 LV 133/10 AO 130/65  Oxygen saturations: PA 70% AO 100%  Cardiac Output (Fick) 3.74  Cardiac Index (Fick) 2.35 PVR 6.1 WU    Hx covid  - opd Rx Feb 2022  HPI HoBenton337.o. -returns for follow-up.  Since last visit in December 2020 when she had right heart catheterization and has significant WHO group 3 pulmonary hypertension.  Inhaled treprostinil has been recommended.  She is getting training for it next week and going to start it.  Then in early February 2022 she had COVID-19.  We called in and got her antivirals.  She is feeling much better.  She has some residual fatigue and cough.  However she does not want to do prednisone.  She is 91% on room air at rest but with easy exertion she drops.  In fact on a 6-minute walk test it is deemed that she requires 8 L of oxygen.  She will not qualify for portable system anymore.  Therefore she is going to get this on her own.  For simple  walk 5 L is good enough but for heavy walk she needs 8 L.  So she is going to pay out-of-pocket for the portable innogen system.  We discussed support group.  She finds attending it is the motivating.  She got support from 2 of her friends RoJoseph Berkshirend BeGar Gibbon Both of them are now deceased .  She is sad about it.  She think she will attend the upcoming fibrosis support group meeting via video.  She is tolerating low-dose nintedanib fine.  She is on prednisone at 10 mg.  She is going to start treprostinil next week.  She think she will attend the support group by video and she has a video invitation for that.     OV 10/08/2020  Subjective:  Patient ID: HoMammie Lorenzofemale , DOB: [birth year] age 86.o. , MRN: [id number] ADDRESS: [address] [place] [id number] GrLavone OrnMD Patient Care Team: GrLavone OrnMD as PCP - General (Internal Medicine) RoFay RecordsMD as PCP - Cardiology (Cardiology)  This Provider for this visit: Treatment Team:  Attending Provider: RaBrand MalesMD    10/08/2020 -   Chief Complaint  Patient presents with   Follow-up    Pt states she has been doing okay since last visit and denies any real complaints.     Interstitial lung disease ( Dr GiLauris Chromannd Dr RaChase Caller -IPF [UIP in August 2016 CT scan read locally - [facility] the odds of the scan being more consistent with hypersensitive pneumonitis is low.  The odds are this is UIP/IPF.  [facility]  Regardless there is progression and it is appropriate patients on antifibrotic therapy as she is right  now] versus   - chronic hypersensitivity pneumonitis [feather pillow exposure and air trapping on CT scan per report]  - [facility]: UIP/IPF - no air trapping on Oct 2020 and Oct 2021 CT -> dx is IPF bsed on April 2021 conference and  more formally infored to patient 12/26/19  - started [facility] Oct 2017 - [facility] Lauris Chroman  - Dickey Gave Jan 2021 - stopped full dose may 2021 -> since then 182m once daily -> on 1055mtwice daily by Aug 2021  - air purifier since jan 2021  -  Left diaphragm elevation-chronic and contributing to asymmetry of basal honeycombing  WHO group 3 Pulm Htn 02/13/20  - did not tolerate tyvaso  Hx of covid feb 2022  [facility] -returns for her 6-13-monthllow-up.  Last seen in February 2022.  After that she was started on inhaled treprostinil but this did not work well for her.  She had intolerance and she quit.  Right now she is taking prednisone 10 mg/day and also nintedanib 100 mg twice daily.  This is working well for her not much GI side effects.  Weight is overall stable.  She uses 3 L nasal cannula at rest.  She uses 5 L when moving around the house and 7 L for climbing the stairs.  Early morning when she not using her oxygen she sometimes feels blind spots.  She is wondering if it is because of low oxygen.  She is never checked her pulse ox at this time but it only consistently happens early in the morning when she is not on oxygen.  Otherwise fine.  She says she tried to reduce her prednisone to 7 and half milligrams per day but could not tolerate it.  She is satisfied just doing 10 mg.  Her most recent liver function test was in July 2020 and stable.  Symptom score shows slight worsening.  She tells me that she is somewhat reluctant to do a walk test and pulmonary function test.  She would rather monitor with a CT scan of the chest.  Last CT scan was in October 2021.  She is willing to have 1 year CT scan done.  She does not qualify for inhaled nitric oxide pulse study because age is greater than 80.1   OV 02/09/2021 Type of visit: Telephone/Video Circumstance: COVID-19 national emergency Identification of patient HopCLARISSIA MCKEENth 4/610/03/1936d MRN [id number] person  identifier Risks: Risks, benefits, limitations of telephone visit explained. Patient understood and verbalized agreement to proceed Anyone else on call: patient and her husband Reggie Patient location: her home - [address] [id number] provider [place]: [address]; GreHappyC [id number] Office. [phone]    Subjective:  Patient ID: HopMammie Lorenzoemale , DOB: 4/608-21-1936age 28 33o. , MRN: [id number]: [address] [id number] GriLavone OrnD Patient Care Team: GriLavone OrnD as PCP - General (Internal Medicine) RosFay RecordsD as PCP - Cardiology (Cardiology) McLLarey DresserD as PCP - Advanced Heart Failure (Cardiology)  This Provider for this visit: Treatment Team:  Attending Provider: RamBrand MalesD     Interstitial lung disease ( Dr GilLauris Chromand Dr RamChase Caller-IPF [UIP in August 2016 CT scan read locally - [facility] - [facility] and 2016 the odds of the scan being more consistent with hypersensitive pneumonitis is low.  The odds are this is UIP/IPF.  However October 2020 scans from [facility] would be most helpful.  Regardless there is progression and it is appropriate patients on antifibrotic therapy as she is right now] versus   - chronic hypersensitivity pneumonitis [feather pillow exposure and air trapping on CT scan per report]  - [facility]: UIP/IPF - no air trapping on Oct 2020 and Oct 2021 CT -> dx is IPF bsed on April 2021 conference and more formally infored to patient 12/26/19  - started [facility] Oct [facility] - [facility] Lauris Chroman  - Dickey Gave Jan 2021 - stopped full dose may 2021 -> since then 162m once daily -> on 1070mtwice daily by Aug 2021  - air purifier since jan 2021  -  Left diaphragm elevation-chronic and contributing to asymmetry of basal honeycombing  WHO group 3 Pulm Htn 02/13/20  -  did not tolerate tyvaso  Hx of covid feb 2022     02/09/2021 - IPF /chronic resp failure   HPI Amaryah P Fults [age].o. -uses 7-8L during day, night 5-6L at night. Does not feel as good as she did in past. Oc headaches. Also tired . Takes 1h nap each afternoon. Asking about advanced care planning - asking about hospice and life expectancy. Takes 1h to take shower. Has bad wound on leg x 6 weeks.[facility] Feels there is new weepng leg wound "other leg"/ Progerssie doe since last visit.  She has sginficant lymphedema per notes .   Discussed hospice  - Discussed medicare hospice benefit  - a medicare paid benefit  - for people with terminal qualifying  illness such as IPF, COPD, cancer with statistical prognosis < 6 months for which there is no cure - utilization of hospice shows people live longer paradoxically than those without hospice due to improved attention  - explained hospice locations - home, residential etc.,   - explained respite care options for caregivers  - explained that she could still get treatment for non-hospice diagnosis and still come to office to see me for hospice related diagnosis that i provide support for  - explained that hospice provides nursing, MD, chaplain, volunteer and medications and supplies paid through medicare   Discussed home pallitive care  - supporitive care but allows for ofev - they can help MOST form   Health care ad goals of care  - Husband healthcare POA  - No vent -  No CPR - Prefers not to go to hospital - [place] - [place] 84 one grandson ag [phone] daClinical research associaten laSports coachs math tutor - [employer] - Daughter [place] age 84   (Husband in agreement and aware)     Life expectancy discussed - could be few months to 12-24 months   SYMPTOM SCALE - ILD Nov 2020 04/22/2019 - ofev since jan 2021  09/23/2019 125# ofev 15061maily 12/26/2019 126# ofev 100m42mice daily  04/16/2020 123# 10/08/2020 121# 3L rest 5L simple ex, 7L more ex 02/09/2021 120#  O2 use 4L with ex Portable with exertion  4-5 L at night and 4-5L with exertion, uses o2 75% of the time, 95% RA at rest 91% RA at rest    Shortness of Breath  0 -> 5 scale with 5 being worst (score 6 If unable to  do)       At rest 2 0 _0 Simple tasks - showers, clothes change, eating, shaving _1 3-4 now. Takes o2 into shower.  Does not feel she will pas out  Able to shower on own but takes 2X - 1h  Household (dishes, doing bed, laundry) 4 3._2 Shopping _3 Walking level at own pace _4 Walking up Stairs _5 Total (30-36) Dyspnea Score 19 11._6 How bad is your cough? 4 2.5 improved with ofev 2.5 better 2 2  - tends to cough more at night. Dorthula Nettles,. Episodic sometimes is 5.   How bad is your fatigue _7 ok _8 How bad is nausea  0 0 0 0 0 0  How bad is vomiting?   0 0 0 0 00 0  How bad is diarrhea?  0 2 0 0 0 0  How bad is anxiety?  1 0 0 0 0   How bad is depression  0 0 0 0 0      Simple office walk 250 feet x  3 laps goal with forehead probe 03/08/2018  04/22/2019  09/23/2019  10/08/2020    O2 used Room air Room air ra Did nto do  Number laps completed 3 Only did 2 laps Did 2 laps and desaturated at end of 2nd lap but kept walking to 3.5laps   Comments about pace brisk Mod pace Mod pace   Resting Pulse Ox/HR 99% and 85/min 99% and 86/min 998% and HR 80/min   Final Pulse Ox/HR 90% and 124/min 87% and 119/min 88% and HR 112/min @ end of 2nd laps -> 84% at 3.5 laps without staopping / HR 116/min   Desaturated </= 88% no yes    Desaturated <= 3% points yes yues    Got Tachycardic >/= 90/min yes yes    Symptoms at end of test Moderate dyspnea Mod dyspnea Mo dyspnea   Miscellaneous comments x woprse     CT Chest data - HRCT Nov 2022  TECHNIQUE: Multidetector CT imaging of the chest was performed following the standard  protocol without intravenous contrast. High resolution imaging of the lungs, as well as inspiratory and expiratory imaging, was performed.   COMPARISON:  12/19/2019, 12/10/2018 10/27/2014   FINDINGS: Cardiovascular: Aortic atherosclerosis. Aortic valve calcifications. Mild cardiomegaly. Three-vessel coronary artery calcifications. No pericardial effusion. Enlargement of the main pulmonary artery measuring up to 3.6 cm in caliber for   Mediastinum/Nodes: No enlarged mediastinal, hilar, or axillary lymph nodes. Mild tracheobronchomalacia on expiratory phase imaging. Thyroid gland and esophagus demonstrate no significant findings.   Lungs/Pleura: Redemonstrated moderate to severe pulmonary fibrosis in a pattern with apical to basal gradient, asymmetrically severe on the left, irregular peripheral interstitial opacity, septal thickening, traction bronchiectasis, and extensive honeycombing throughout the bilateral lung bases and left upper lobe. There is no significant appreciable change in comparison to prior examination dated 12/20/2019, however findings are significantly worsened over a longer period of time on examinations dating back to 10/27/2014. Mild, lobular air trapping on expiratory phase imaging. No pleural effusion or pneumothorax.   Upper Abdomen: No acute abnormality.   Musculoskeletal: No chest wall mass or suspicious bone lesions identified.   IMPRESSION: 1. Redemonstrated moderate to severe pulmonary  fibrosis in a pattern with apical to basal gradient, asymmetrically severe on the left, irregular peripheral interstitial opacity, septal thickening, traction bronchiectasis, and extensive honeycombing throughout the bilateral lung bases and left upper lobe. There is no significant appreciable change in comparison to prior examination dated 12/20/2019, however findings are significantly worsened over a longer period of time on examinations dating back to  10/27/2014. Findings remain consistent with UIP per consensus guidelines: Diagnosis of Idiopathic Pulmonary Fibrosis: An Official ATS/ERS/JRS/ALAT Clinical Practice Guideline. [place], Iss 5, [phone], Oct 29 2016. 2. Mild, lobular air trapping on expiratory phase imaging, suggestive of small airways disease. 3. Mild tracheobronchomalacia on expiratory phase imaging. 4. Coronary artery disease. 5. Aortic valve calcifications. Correlate for echocardiographic evidence of aortic valve dysfunction. 6. Enlargement of the main pulmonary artery, as can be seen in pulmonary hypertension.   Aortic Atherosclerosis (ICD10-I70.0).     Electronically Signed   By: Delanna Ahmadi M.D.   On: 01/12/2021 09:51  No results found.    PFT  PFT Results Latest Ref Rng & Units 12/16/2019 10/28/2014  FVC-Pre L 1.45 2.19  FVC-Predicted Pre % 60 84  FVC-Post L - 2.18  FVC-Predicted Post % - 84  Pre FEV1/FVC % % 89 90  Post FEV1/FCV % % - 90  FEV1-Pre L 1.30 1.97  FEV1-Predicted Pre % 73 102  FEV1-Post L - 1.96  DLCO uncorrected ml/min/mmHg 7.33 12.16  DLCO UNC% % 40 52  DLCO corrected ml/min/mmHg 7.33 -  DLCO COR %Predicted % 40 -  DLVA Predicted % 70 80  TLC L - 4.11  TLC % Predicted % - 83  RV % Predicted % - 83       has a past medical history of CHF (congestive heart failure) ([place]), Hemorrhoids, and IPF (idiopathic pulmonary fibrosis) ([place]).   reports that she quit smoking about 33 years ago. Her smoking use included cigarettes. She has a 7.50 pack-year smoking history. She has never used smokeless tobacco.  Past Surgical History:  Procedure Laterality Date   BREAST EXCISIONAL BIOPSY Left    x 2   RIGHT/LEFT HEART CATH AND CORONARY ANGIOGRAPHY N/A 02/13/2020   Procedure: RIGHT/LEFT HEART CATH AND CORONARY ANGIOGRAPHY;  Surgeon: Larey Dresser, MD;  Location: [facility];  Service: Cardiovascular;  Laterality: N/A;   TONSILLECTOMY     age 24     Allergies  Allergen Reactions   5-Alpha Reductase Inhibitors    Prednisone     Hallucination when given in high doses     Immunization History  Administered Date(s) Administered   Influenza Split 12/01/2009, 11/29/2010, 11/17/2014, 11/30/2015, 11/28/2016   Influenza, High Dose Seasonal PF 10/29/2016, 11/15/2018, 12/12/2019   Influenza,inj,Quad PF,6+ Mos 11/05/2014   Influenza-Unspecified 11/28/2013, 11/05/2014, 11/30/2015, 11/15/2018   PFIZER(Purple Top)SARS-COV-2 Vaccination 03/11/2019, 03/30/2019, 10/25/2019   Pneumococcal Conjugate-13 08/08/2013   Pneumococcal Polysaccharide-23 03/28/2005, 02/10/2018, 09/07/2018   Td 03/30/2007   Tdap 08/22/2016   Zoster Recombinat (Shingrix) 09/22/2016, 12/22/2016   Zoster, Live 02/28/2006, 09/22/2016, 12/22/2016    Family History  Problem Relation Age of Onset   Stroke Father    Lung cancer Brother        mets     Current Outpatient Medications:    aspirin 81 MG EC tablet, Take 81 mg by mouth daily. , Disp: , Rfl:    atorvastatin (LIPITOR) 80 MG tablet, Take 1 tablet (80 mg total) by mouth daily., Disp: 90 tablet, Rfl: 3   benzonatate (  TESSALON) 200 MG capsule, Take 1 capsule (200 mg total) by mouth 2 (two) times daily as needed for cough., Disp: 60 capsule, Rfl: 1   Calcium Carbonate+Vitamin D 600-200 MG-UNIT TABS, Take 1 tablet by mouth daily., Disp: , Rfl:    Nintedanib (OFEV) 100 MG CAPS, Take 1 capsule (100 mg total) by mouth 2 (two) times daily., Disp: 180 capsule, Rfl: 1   predniSONE (DELTASONE) 10 MG tablet, Take 10 mg by mouth daily in the afternoon., Disp: , Rfl:       Objective:   There were no vitals filed for this visit.  Estimated body mass index is 20.6 kg/m as calculated from the following:   Height as of 01/06/21: _0  (1.626 m).   Weight as of 01/06/21: 120 lb (54.4 kg).  _1 @  There were no vitals filed for this visit.   Physical Exam Sounded mildly dyspniec talking      Assessment:        ICD-10-CM   1. IPF (idiopathic pulmonary fibrosis) ([place])  J84.112     2. Chronic respiratory failure with hypoxia (HCC)  J96.11     3. WHO group 3 pulmonary arterial hypertension (HCC)  I27.23     4. Current chronic use of systemic steroids  Z79.52     5. Therapeutic drug monitoring  Z51.81     6. Pedal edema  R60.0     7. Physical deconditioning  R53.81     8. Advance care planning  Z71.89     9. DNR (do not resuscitate)  Z66          Plan:     Patient Instructions     ICD-10-CM   1. IPF (idiopathic pulmonary fibrosis) ([place])  J84.112     2. Chronic respiratory failure with hypoxia (HCC)  J96.11     3. WHO group 3 pulmonary arterial hypertension (HCC)  I27.23     4. Current chronic use of systemic steroids  Z79.52     5. Therapeutic drug monitoring  Z51.81     6. Pedal edema  R60.0     7. Physical deconditioning  R53.81     8. Advance care planning  Z71.89     9. DNR (do not resuscitate)  Z66       Clinically stable with steady gradual progression slowly and definitely worsening fatigue Functional decline in quality of life Fatigue Glad tolerating ofev 177m twice daily and prednisone 144mdaily Using o2 - titrate for pulse ox > 88% LFT normal 09/16/20 Agree on doing some basic tests for fatigue  Plan - refer Home Palliative care  - depending on course, can always escalate to hospice -Continue nintedanib at current dosage 10054mwice daily -Continue prednisone at 10 mg/day -Use oxygen as before - No tyvaso due to prior intolerance -Hold off clinical trials as a care option at the moment - check CBC, BMET, LFT, HgbA1C, TSH,  FT4 , BNP, troponin, - this week   Follow-up  -video visit in Jan 2023 first available    (Level 04: Estb 30-39 min n  visit type: video virtual visit visit spent in total care time and counseling or/and coordination of care by this undersigned MD - Dr MurBrand Maleshis includes one or more of the following on this  same day 02/09/2021: pre-charting, chart review, note writing, documentation discussion of test results, diagnostic or treatment recommendations, prognosis, risks and benefits of management options, instructions, education, compliance or risk-factor reduction. It excludes time spent  by the [facility] or office staff in the care of the patient . Actual time is 36 min)    SIGNATURE    Dr. Brand Males, M.D., F.C.C.P,  Pulmonary and Critical Care Medicine Staff Physician, [facility] Director - Interstitial Lung Disease  Program  P[facility] [place], [place], [address]  Pager: [phone], If no answer or between  15:00h - 7:00h: call [phone] Telephone: [phone]  4:51 PM 02/09/2021

## 2021-02-09 NOTE — Telephone Encounter (Signed)
Patient returned phone call, she states she cannot do it Wednesday as she has several other appts that day.   She also states she does not live far from him and would not mind a house call if it was convenient for him.   MR please advise. Patient states is there any way you can do the video visit this afternoon?Thanks

## 2021-02-09 NOTE — Telephone Encounter (Signed)
Call made to patient, made aware of MR recommendations. Voiced understanding. Placed on schedule for this afternoon.   Nothing further needed at this time.

## 2021-02-10 DIAGNOSIS — I890 Lymphedema, not elsewhere classified: Secondary | ICD-10-CM | POA: Diagnosis not present

## 2021-02-10 DIAGNOSIS — I872 Venous insufficiency (chronic) (peripheral): Secondary | ICD-10-CM | POA: Diagnosis not present

## 2021-02-10 DIAGNOSIS — T8133XD Disruption of traumatic injury wound repair, subsequent encounter: Secondary | ICD-10-CM | POA: Diagnosis not present

## 2021-02-10 DIAGNOSIS — T8133XA Disruption of traumatic injury wound repair, initial encounter: Secondary | ICD-10-CM | POA: Diagnosis not present

## 2021-02-10 DIAGNOSIS — Z7952 Long term (current) use of systemic steroids: Secondary | ICD-10-CM | POA: Diagnosis not present

## 2021-02-10 DIAGNOSIS — L97919 Non-pressure chronic ulcer of unspecified part of right lower leg with unspecified severity: Secondary | ICD-10-CM | POA: Diagnosis not present

## 2021-02-10 DIAGNOSIS — I83019 Varicose veins of right lower extremity with ulcer of unspecified site: Secondary | ICD-10-CM | POA: Diagnosis not present

## 2021-02-10 DIAGNOSIS — I739 Peripheral vascular disease, unspecified: Secondary | ICD-10-CM | POA: Diagnosis not present

## 2021-02-10 DIAGNOSIS — J849 Interstitial pulmonary disease, unspecified: Secondary | ICD-10-CM | POA: Diagnosis not present

## 2021-02-11 DIAGNOSIS — J849 Interstitial pulmonary disease, unspecified: Secondary | ICD-10-CM | POA: Diagnosis not present

## 2021-02-12 ENCOUNTER — Telehealth: Payer: Medicare HMO | Admitting: Internal Medicine

## 2021-02-12 DIAGNOSIS — I83019 Varicose veins of right lower extremity with ulcer of unspecified site: Secondary | ICD-10-CM | POA: Diagnosis not present

## 2021-02-15 ENCOUNTER — Telehealth: Payer: Self-pay

## 2021-02-15 NOTE — Telephone Encounter (Signed)
(  3:50 pm) SW left a message for patient/spouse requesting a call back to schedule a visit.

## 2021-02-18 DIAGNOSIS — J849 Interstitial pulmonary disease, unspecified: Secondary | ICD-10-CM | POA: Diagnosis not present

## 2021-02-23 ENCOUNTER — Encounter: Payer: Self-pay | Admitting: Family Medicine

## 2021-02-23 ENCOUNTER — Other Ambulatory Visit: Payer: Medicare HMO | Admitting: Family Medicine

## 2021-02-23 ENCOUNTER — Other Ambulatory Visit: Payer: Self-pay

## 2021-02-23 VITALS — BP 148/62 | HR 62 | Resp 24

## 2021-02-23 DIAGNOSIS — I739 Peripheral vascular disease, unspecified: Secondary | ICD-10-CM

## 2021-02-23 DIAGNOSIS — L97912 Non-pressure chronic ulcer of unspecified part of right lower leg with fat layer exposed: Secondary | ICD-10-CM

## 2021-02-23 DIAGNOSIS — J84112 Idiopathic pulmonary fibrosis: Secondary | ICD-10-CM

## 2021-02-23 DIAGNOSIS — J9611 Chronic respiratory failure with hypoxia: Secondary | ICD-10-CM

## 2021-02-23 DIAGNOSIS — M792 Neuralgia and neuritis, unspecified: Secondary | ICD-10-CM | POA: Diagnosis not present

## 2021-02-23 DIAGNOSIS — Z7189 Other specified counseling: Secondary | ICD-10-CM

## 2021-02-23 NOTE — [facility] (Addendum)
[facility] Consult Note Telephone: [phone]  Fax: [phone]   Date of encounter: 02/23/21 1:02 pm PATIENT NAME: [name] [id number]   [phone] (home)  DOB: [birth year] MRN: [id number] PRIMARY CARE PROVIDER:    [name], MD,  [facility] [employer] [address] [place] [address] [phone]  REFERRING PROVIDER:   [name], MD [facility] [employer] [address] [place],  [place] [address] [phone]  RESPONSIBLE PARTY:    Contact Information     Name Relation Home Work Norge Spouse [phone]        Spouse goes by "Reggie".  I met face to face with patient and spouse in their home. Palliative Care was asked to follow this patient by consultation request of  [name], MD to address advance care planning and complex medical decision making. This is the initial visit.                                     ASSESSMENT, SYMPTOM MANAGEMENT AND PLAN / RECOMMENDATIONS:   Chronic Hypoxic Respiratory Failure/idiopathic pulmonary fibrosis Agree with continuing Ofev 100 mg BID, home O2 through [employer] at 6-8 L [place].  Continue follow up with Pulmonology.  [facility] bed to elevate [facility] when sleeping to ease breathing/decrease cough.  Currently pt is not interested in hospital bed (wants to sleep next to spouse). 2.  Traumatic right lower extremity ulcer-describes neuropathic pain lateral to ulcer.  Discussed use of Gabapentin to assist with pain-pt fears affecting breathing ability.  Continue home dressing changes with Santyl and use of compression to decrease lymphedema.  Likewise hospital bed would allow for elevation of LE when pt ready. Encourage premedication with Alleve prior to dressing change/debridement.  May benefit from home health RN to assist with dressing changes. 3.  Advance Care Planning-Completed DNR and MOST   Advance Care Planning/Goals of Care: Goals include  to maximize quality of life and symptom management. Patient/health care surrogate gave his/her permission to discuss.Our advance care planning conversation included a discussion about:    The value and importance of advance care planning  Exploration of personal, cultural or spiritual beliefs that might influence medical decisions  Exploration of goals of care in the event of a sudden injury or illness  Identification and preparation of a healthcare agent  Review and updating or creation of an advance directive document . Decision not to resuscitate or to de-escalate disease focused treatments due to poor prognosis.  CODE STATUS: Do Not Resuscitate (DNR) Do Not Intubate (DNI), comfort measures only Antibiotics if indicated IV fluids for a defined trial period, only for comfort use not to prolong life No feeding tube   Follow up Palliative Care Visit: Palliative care will continue to follow for complex medical decision making, advance care planning, and clarification of goals. Return 4 weeks or prn.  I spent 96 minutes providing this consultation of which 32 minutes from 1:40-2:12 pm spent in ACP. More than 50% of the time in this consultation was spent in counseling and care coordination.  This visit was coded based on medical decision making (MDM).  PPS: 60%  HOSPICE ELIGIBILITY/DIAGNOSIS: TBD  Chief Complaint:  Palliative Care Consult requested due to patient having idiopathic pulmonary fibrosis to help with advance care planning and goals of care, symptom management.  HISTORY OF PRESENT ILLNESS:  Terry P  Conway is a 86 y.o. year old female who lives with her primary caregiver and spouse, Reggie.  She has a son who lives locally and a daughter who is distant, living in [place]. She was diagnosed with idiopathic pulmonary fibrosis 6 years ago, is currently O2 dependent varying from 6-8 L depending on her level of activity. She is becoming increasingly dyspneic with minimal activity and  requiring increased rest periods although she remains ambulatory and active.  She is followed by Pulmonologist Dr [name].  She is requiring 8 L [place] O2 at night and sometimes is dyspneic when laying down and having increased cough per spouse.  Mostly during the day she is wearing 6 L and can no longer navigate the stairs. She continues to drive rarely and still does some light housekeeping. She is independent with her ADLs, has a good appetite and no recent major weight loss.  She is being seen by the wound center at [facility] in [place] for a skin tear to her RLE.  They have been trying to have her use dressings to self debride in between visits.  She c/o pain lateral to the skin tear which she describes as shooting, "shock-like" pain with movement of her RLE.  She does have PAD and pulmonary hypertension as well.  She tries to manage her emotions while with spouse Reggie and only allows herself to break down when on her own. She says that her son who is back up for his father is very aware of her illness and is aware of her wishes.  Her daughter who lives in [place] is aware of her wishes but she thinks that she is not ready to acknowledge how much her disease has progressed.  History obtained from review of EMR, discussion with family and Ms. [name].  I reviewed available labs, medications, imaging, studies and related documents from the EMR.  Records reviewed and summarized above.   ROS General: Dyspneic at rest EENT: Denies visual change or dysphagia Pulmonary: intermittent cough, resting dyspnea increases with any activity and requires longer with longer rest periods between and increase in O2 Abdomen: endorses good appetite, denies constipation, endorses continence of bowel GU: denies dysuria, endorses continence of urine MSK:  denies weakness,  no falls reported Skin: RLE anterior shin wound with daily dressing debridement and follows with wound care center Neurological: denies  insomnia Psych: Endorses positive mood Heme/lymph/immuno: denies bruises, abnormal bleeding  Physical Exam: Current and past weights: 120 lbs as of 01/19/2021 Constitutional: Dyspneic at rest, wearing O2 @ 8L. General: thin  EYES: anicteric sclera, lids intact, no discharge  ENMT: intact hearing, oral mucous membranes moist, dentition intact CV: S1S2, RRR, 1+ RLE edema, wrapped in ACE bandage, trace LLE edema Pulmonary: CTAB, can speak 4-5 words at a time with need to breathe and mild breathlessness noted on 8 L [place], no cough, significant DOE Abdomen: intake 100%, normo-active BS + 4 quadrants, soft and non tender, no ascites GU: deferred MSK: no sarcopenia, moves all extremities, ambulatory Skin: warm and dry, has RLE anterior leg wound covered in dressing and wrapped in ACE bandage, LLE with outer lateral skin abrasion with scan serosanguinous drainage (will try to load image that pt took 02/10/21) Neuro:  no generalized weakness,  no cognitive impairment Psych: non-anxious affect, A and O x 3 Hem/lymph/immuno: no widespread bruising  CURRENT PROBLEM LIST:  Patient Active Problem List   Diagnosis Date Noted   Atherosclerosis of coronary artery    Peripheral artery disease ([facility]) 02/03/2021  Traumatic leg ulcer, right, with fat layer exposed ([facility]) 02/03/2021   Chronic venous insufficiency of lower extremity 01/14/2021   Chronic respiratory failure with hypoxia ([facility]) 05/24/2019   Oxygen dependent 04/30/2019   IPF (idiopathic pulmonary fibrosis) ([facility]) 10/20/2014   PAST MEDICAL HISTORY:  Active Ambulatory Problems    Diagnosis Date Noted   IPF (idiopathic pulmonary fibrosis) ([facility]) 10/20/2014   Chronic respiratory failure with hypoxia ([facility]) 05/24/2019   Atherosclerosis of coronary artery    Chronic venous insufficiency of lower extremity 01/14/2021   Peripheral artery disease ([facility]) 02/03/2021   Oxygen dependent 04/30/2019   Traumatic leg ulcer, right, with fat layer exposed ([facility])  02/03/2021   Resolved Ambulatory Problems    Diagnosis Date Noted   No Resolved Ambulatory Problems   Past Medical History:  Diagnosis Date   CHF (congestive heart failure) (H[facility])    Hemorrhoids    SOCIAL HX:  Social History   Tobacco Use   Smoking status: Former    Packs/day: 0.25    Years: 30.00    Pack years: 7.50    Types: Cigarettes    Quit date: 1989    Years since quitting: 34.0   Smokeless tobacco: Never  Substance Use Topics   Alcohol use: Yes    Alcohol/week: 7.0 standard drinks    Types: 7 Shots of liquor per week    Comment: 2 per day   FAMILY HX:  Family History  Problem Relation Age of Onset   Stroke Father    Lung cancer Brother        mets      ALLERGIES:  Allergies  Allergen Reactions   5-Alpha Reductase Inhibitors    Prednisone     Hallucination when given in high doses      PERTINENT MEDICATIONS:  Outpatient Encounter Medications as of 02/23/2021  Medication Sig   aspirin 81 MG EC tablet Take 81 mg by mouth daily.    atorvastatin (LIPITOR) 80 MG tablet Take 1 tablet (80 mg total) by mouth daily.   benzonatate (TESSALON) 200 MG capsule Take 1 capsule (200 mg total) by mouth 2 (two) times daily as needed for cough.   Calcium Carbonate+Vitamin D 600-200 MG-UNIT TABS Take 1 tablet by mouth daily.   Nintedanib (OFEV) 100 MG CAPS Take 1 capsule (100 mg total) by mouth 2 (two) times daily.   predniSONE (DELTASONE) 10 MG tablet Take 10 mg by mouth daily in the afternoon.   No facility-administered encounter medications on file as of 02/23/2021.   Thank you for the opportunity to participate in the care of Ms. [name].  The palliative care team will continue to follow. Please call our office at [phone] if we can be of additional assistance.   Marijo Conception, FNP -C  COVID-19 PATIENT SCREENING TOOL Asked and negative response unless otherwise noted:  Have you had symptoms of covid, tested positive or been in contact with someone with  symptoms/positive test in the past 5-10 days? no

## 2021-02-24 DIAGNOSIS — I890 Lymphedema, not elsewhere classified: Secondary | ICD-10-CM | POA: Diagnosis not present

## 2021-02-24 DIAGNOSIS — I872 Venous insufficiency (chronic) (peripheral): Secondary | ICD-10-CM | POA: Diagnosis not present

## 2021-02-24 DIAGNOSIS — L97919 Non-pressure chronic ulcer of unspecified part of right lower leg with unspecified severity: Secondary | ICD-10-CM | POA: Diagnosis not present

## 2021-02-24 DIAGNOSIS — Z7952 Long term (current) use of systemic steroids: Secondary | ICD-10-CM | POA: Diagnosis not present

## 2021-02-24 DIAGNOSIS — I739 Peripheral vascular disease, unspecified: Secondary | ICD-10-CM | POA: Diagnosis not present

## 2021-02-24 DIAGNOSIS — J849 Interstitial pulmonary disease, unspecified: Secondary | ICD-10-CM | POA: Diagnosis not present

## 2021-02-24 DIAGNOSIS — I83019 Varicose veins of right lower extremity with ulcer of unspecified site: Secondary | ICD-10-CM | POA: Diagnosis not present

## 2021-02-24 DIAGNOSIS — T8133XD Disruption of traumatic injury wound repair, subsequent encounter: Secondary | ICD-10-CM | POA: Diagnosis not present

## 2021-02-24 DIAGNOSIS — T8133XA Disruption of traumatic injury wound repair, initial encounter: Secondary | ICD-10-CM | POA: Diagnosis not present

## 2021-02-28 DIAGNOSIS — I2510 Atherosclerotic heart disease of native coronary artery without angina pectoris: Secondary | ICD-10-CM | POA: Insufficient documentation

## 2021-03-02 ENCOUNTER — Telehealth: Payer: Self-pay | Admitting: Internal Medicine

## 2021-03-02 ENCOUNTER — Encounter: Payer: Self-pay | Admitting: Internal Medicine

## 2021-03-02 DIAGNOSIS — T8133XD Disruption of traumatic injury wound repair, subsequent encounter: Secondary | ICD-10-CM | POA: Diagnosis not present

## 2021-03-02 DIAGNOSIS — Z7952 Long term (current) use of systemic steroids: Secondary | ICD-10-CM | POA: Diagnosis not present

## 2021-03-02 DIAGNOSIS — I739 Peripheral vascular disease, unspecified: Secondary | ICD-10-CM | POA: Diagnosis not present

## 2021-03-02 DIAGNOSIS — I872 Venous insufficiency (chronic) (peripheral): Secondary | ICD-10-CM | POA: Diagnosis not present

## 2021-03-02 DIAGNOSIS — I890 Lymphedema, not elsewhere classified: Secondary | ICD-10-CM | POA: Diagnosis not present

## 2021-03-02 DIAGNOSIS — L97919 Non-pressure chronic ulcer of unspecified part of right lower leg with unspecified severity: Secondary | ICD-10-CM | POA: Diagnosis not present

## 2021-03-02 DIAGNOSIS — I83019 Varicose veins of right lower extremity with ulcer of unspecified site: Secondary | ICD-10-CM | POA: Diagnosis not present

## 2021-03-02 DIAGNOSIS — T8133XA Disruption of traumatic injury wound repair, initial encounter: Secondary | ICD-10-CM | POA: Diagnosis not present

## 2021-03-02 DIAGNOSIS — J849 Interstitial pulmonary disease, unspecified: Secondary | ICD-10-CM | POA: Diagnosis not present

## 2021-03-02 NOTE — Telephone Encounter (Signed)
Pt sent mychart message this am. Pt states her breathing is not good,nostrils are very congested. Pt being seen at wound center for skin tear on leg, pt states they think pt needs pain medicine at night and pt feels she is limited d/t IPF status. Pt would like to talk to MR about prednisone dose she is taking. Please advise [phone]

## 2021-03-02 NOTE — Telephone Encounter (Signed)
See phone note from 03/02/21.

## 2021-03-02 NOTE — Telephone Encounter (Signed)
Left message for patient to call back.   Also see Mychart message from earlier today.

## 2021-03-03 ENCOUNTER — Encounter: Payer: Self-pay | Admitting: Internal Medicine

## 2021-03-03 NOTE — Telephone Encounter (Signed)
She only wanted you to prescribe something for pain but she did not want something that was too strong. Please advise.

## 2021-03-03 NOTE — Telephone Encounter (Signed)
Called and spoke with patient, advised patient of recommendations per Dr. [name].  She stated that she is not in pain and did not want to start taking pain medication during the day since she is already tired and sob, she does not want to be lethargic during the day.  She has been taking Tylenol at night, she says if taking 2 tylenol at night instead of 1 would be beneficial she will do that.  I advised her I would make Dr. [name] aware.  Dr. [name], please advise.  Thank you.

## 2021-03-03 NOTE — Telephone Encounter (Signed)
Ok let us start with tramadol 24m tid prn x 30 days. If fingerprint needed for this I can do this 03/04/21    Current Outpatient Medications:    aspirin 81 MG EC tablet, Take 81 mg by mouth daily. , Disp: , Rfl:    atorvastatin (LIPITOR) 80 MG tablet, Take 1 tablet (80 mg total) by mouth daily., Disp: 90 tablet, Rfl: 3   benzonatate (TESSALON) 200 MG capsule, Take 1 capsule (200 mg total) by mouth 2 (two) times daily as needed for cough., Disp: 60 capsule, Rfl: 1   Calcium Carbonate+Vitamin D 600-200 MG-UNIT TABS, Take 1 tablet by mouth daily., Disp: , Rfl:    Nintedanib (OFEV) 100 MG CAPS, Take 1 capsule (100 mg total) by mouth 2 (two) times daily., Disp: 180 capsule, Rfl: 1   predniSONE (DELTASONE) 10 MG tablet, Take 10 mg by mouth daily in the afternoon., Disp: , Rfl:    Allergies  Allergen Reactions   5-Alpha Reductase Inhibitors Other (See Comments)   Prednisone Other (See Comments)    Hallucination when given in high doses

## 2021-03-03 NOTE — Telephone Encounter (Signed)
I have called the patient and she reports that she got a skin tear on her lower leg and is being treated at the wound center.   She feels that she is making herself more anxious that she has made herself so short of breath and she is requesting something for pain.   She was also told at the wound center that her prednisone was slowing her process and she wants to know if the benefits outweigh the risk of taking the prednisone for her disease process. She was having so much trouble talking and declined going to the hospital.   The pain center is not able to prescribe pain medications. She is aware she will have to  wait for a response and will only talk to you.

## 2021-03-03 NOTE — Telephone Encounter (Signed)
I think she can safely do 78m tramadol once at night. Safe medication. This is low dose. I Think this might be beter than taking 2 tylenols but she can also do 2 x tylenol

## 2021-03-03 NOTE — Telephone Encounter (Signed)
Yes it is possible that the prednisone is slowing the wound healing and so can nintedanib theoretically because it is an antifibrotic.  Unfortunately there is a risk versus benefit situation.  If she wants to hold off on the nintedanib for a while that is fine.  We can also slowly taper the prednisone down.  At this point in time she is looking at more symptom management and goals of care as opposed to controlling the disease progression  She has home palliative care visiting her.  Can they not prescribe the pain medication for the leg?  Because the pain in the leg is not a primary pulmonary issue.  Please let me know

## 2021-03-04 ENCOUNTER — Encounter: Payer: Self-pay | Admitting: Internal Medicine

## 2021-03-04 ENCOUNTER — Other Ambulatory Visit: Payer: Self-pay | Admitting: Internal Medicine

## 2021-03-04 ENCOUNTER — Encounter (HOSPITAL_COMMUNITY): Payer: Self-pay | Admitting: Cardiology

## 2021-03-04 MED ORDER — FUROSEMIDE 20 MG PO TABS: 20.0000 mg | ORAL_TABLET | Freq: Every day | ORAL | 2 refills | Status: AC

## 2021-03-04 MED ORDER — TRAMADOL HCL 50 MG PO TABS: 50.0000 mg | ORAL_TABLET | Freq: Every day | ORAL | 1 refills | Status: AC

## 2021-03-04 NOTE — Telephone Encounter (Signed)
I spoke with Dr. [name] and he is ok with sending in her lasix. She was appreciate of the matter. Nothing further needed.

## 2021-03-04 NOTE — Addendum Note (Signed)
Addended by: Brand Males on: 03/04/2021 05:29 PM   Modules accepted: Orders

## 2021-03-04 NOTE — Telephone Encounter (Addendum)
Patient is aware of below message and voiced her understanding.  She would like to try Tramadol. Her preferred pharmacy is [facility].  MR, please advise. thanks

## 2021-03-04 NOTE — Telephone Encounter (Signed)
done

## 2021-03-04 NOTE — Addendum Note (Signed)
Addended by: Valerie Salts on: 03/04/2021 05:04 PM   Modules accepted: Orders

## 2021-03-04 NOTE — Telephone Encounter (Signed)
Yes please send tramadol 41m QHS - 30 days with 1 refill

## 2021-03-04 NOTE — Telephone Encounter (Signed)
I have pended the order for tramadol. MR, can you please sign the order. We can not send in tramadol anymore.

## 2021-03-04 NOTE — Addendum Note (Signed)
Addended by: Dessie Coma on: 03/04/2021 05:40 PM   Modules accepted: Orders

## 2021-03-12 ENCOUNTER — Telehealth: Payer: Self-pay | Admitting: Internal Medicine

## 2021-03-12 DIAGNOSIS — I890 Lymphedema, not elsewhere classified: Secondary | ICD-10-CM | POA: Diagnosis not present

## 2021-03-12 DIAGNOSIS — I83019 Varicose veins of right lower extremity with ulcer of unspecified site: Secondary | ICD-10-CM | POA: Diagnosis not present

## 2021-03-12 DIAGNOSIS — T8133XD Disruption of traumatic injury wound repair, subsequent encounter: Secondary | ICD-10-CM | POA: Diagnosis not present

## 2021-03-12 DIAGNOSIS — J849 Interstitial pulmonary disease, unspecified: Secondary | ICD-10-CM | POA: Diagnosis not present

## 2021-03-12 DIAGNOSIS — I739 Peripheral vascular disease, unspecified: Secondary | ICD-10-CM | POA: Diagnosis not present

## 2021-03-12 DIAGNOSIS — M79605 Pain in left leg: Secondary | ICD-10-CM | POA: Diagnosis not present

## 2021-03-12 DIAGNOSIS — T8133XA Disruption of traumatic injury wound repair, initial encounter: Secondary | ICD-10-CM | POA: Diagnosis not present

## 2021-03-12 DIAGNOSIS — L97919 Non-pressure chronic ulcer of unspecified part of right lower leg with unspecified severity: Secondary | ICD-10-CM | POA: Diagnosis not present

## 2021-03-12 DIAGNOSIS — I872 Venous insufficiency (chronic) (peripheral): Secondary | ICD-10-CM | POA: Diagnosis not present

## 2021-03-12 DIAGNOSIS — Z7952 Long term (current) use of systemic steroids: Secondary | ICD-10-CM | POA: Diagnosis not present

## 2021-03-12 DIAGNOSIS — S81802S Unspecified open wound, left lower leg, sequela: Secondary | ICD-10-CM | POA: Diagnosis not present

## 2021-03-12 NOTE — Telephone Encounter (Signed)
MR- will you be attending for hospice?

## 2021-03-13 NOTE — Telephone Encounter (Signed)
Yes it will be my privilege to be her hospice attending. LEt her know that I still tplan to drop into her house . Hopefully this coming weekend

## 2021-03-14 DIAGNOSIS — J849 Interstitial pulmonary disease, unspecified: Secondary | ICD-10-CM | POA: Diagnosis not present

## 2021-03-15 DIAGNOSIS — I83019 Varicose veins of right lower extremity with ulcer of unspecified site: Secondary | ICD-10-CM | POA: Diagnosis not present

## 2021-03-15 NOTE — Telephone Encounter (Signed)
Called Santiago Glad and there was no answer- LMTCB

## 2021-03-16 NOTE — Telephone Encounter (Signed)
Called and spoke with Santiago Glad from hospice letting her know that MR said he would be pt's attending. Santiago Glad stated at this time pt will not be admitted due to still receiving wound care treatments but stated to her when pt is able to be, that MR would be the attending. Nothing further needed.

## 2021-03-17 ENCOUNTER — Other Ambulatory Visit: Payer: Medicare HMO | Admitting: Family Medicine

## 2021-03-17 ENCOUNTER — Other Ambulatory Visit: Payer: Medicare HMO

## 2021-03-17 ENCOUNTER — Other Ambulatory Visit: Payer: Self-pay

## 2021-03-17 ENCOUNTER — Encounter: Payer: Self-pay | Admitting: Family Medicine

## 2021-03-17 VITALS — BP 140/88 | HR 89 | Temp 97.4°F | Resp 28

## 2021-03-17 DIAGNOSIS — J9611 Chronic respiratory failure with hypoxia: Secondary | ICD-10-CM | POA: Diagnosis not present

## 2021-03-17 DIAGNOSIS — Z515 Encounter for palliative care: Secondary | ICD-10-CM | POA: Diagnosis not present

## 2021-03-17 DIAGNOSIS — L97912 Non-pressure chronic ulcer of unspecified part of right lower leg with fat layer exposed: Secondary | ICD-10-CM | POA: Diagnosis not present

## 2021-03-17 DIAGNOSIS — Z9981 Dependence on supplemental oxygen: Secondary | ICD-10-CM

## 2021-03-17 DIAGNOSIS — J84112 Idiopathic pulmonary fibrosis: Secondary | ICD-10-CM

## 2021-03-17 DIAGNOSIS — M792 Neuralgia and neuritis, unspecified: Secondary | ICD-10-CM | POA: Diagnosis not present

## 2021-03-17 NOTE — Progress Notes (Signed)
[facility] Consult Note Telephone: [phone]  Fax: [phone]    Date of encounter: 03/17/21 5:00 PM PATIENT NAME: [address] [place] [address]   [phone] (home)  DOB: 1934/03/14 MRN: [id number] PRIMARY CARE PROVIDER:    [name], MD,  [place] [employer] [address] [place] [address] [phone]  REFERRING PROVIDER:   [name], MD [place] [employer] [address] [place],  [place] [address] [phone]  RESPONSIBLE PARTY:    Contact Information     Name Relation Home Work [place] Spouse [phone]        Spouse goes by "Reggie".   I met face to face with patient and spouse in their home. Palliative Care was asked to follow this patient by consultation request of  [name], MD to address advance care planning and complex medical decision making. This is a follow up visit to reassess dyspnea and qualification for Hospice.  Dr Golden Pop office, Magda Paganini, contacted this provider on Tuesday of this week (03/16/2021) to notify that he was in agreement with Hospice and willing to serve as Hospice attending.                                   ASSESSMENT, SYMPTOM MANAGEMENT AND PLAN / RECOMMENDATIONS:  Palliative Care Encounter-pt wants to remain home and independent as possible with comfort care as the goal. Referral to Hospice for Comfort Care. Chronic Respiratory Failure, O2 Dependent with IPF-Currently on Ofev, may need to d/c.  Continue O2 @ 8L [place].  Traumatic ulcer with neuropathy RLE-Continue wound care for comfort, being debrided with Santyl.  Refused Gabapentin previously due to concern for respiratory depression. Allowed to take Tylenol up to 4 gm daily for pain relief.  May adjust as disease progresses.   Advance Care Planning/Goals of Care: Goals include to maximize quality of life, provide emotional support to patient/family and symptom management.  Pt wants to remain  in her own home, be comfortable and continue to do whatever she feels she can to be independent and continue to interact with family and friends.  CODE STATUS: DNR  MOST as of 02/23/2021: Do Not Intubate (DNI), comfort measures only Antibiotics if indicated IV fluids for a defined trial period, only for comfort use not to prolong life No feeding tube   Follow up Palliative Care Visit: Palliative care is referring for Hospice Care admission.   This visit was coded based on medical decision making (MDM).  PPS: 40%  HOSPICE ELIGIBILITY/DIAGNOSIS:  Eligible with agreement from supervising physician Dr Allie Dimmer Monguilod and Dr [name] who will serve as Hospice attending. Chronic Respiratory Failure secondary to Idiopathic Pulmonary Fibrosis.  Chief Complaint:  Acute visit requested to reassess pt for potential hospice admission.  PCP-Dr [name], Pulmonologist-Dr Ramaswamy who has agreed to serve as Hospice attending both in agreement with Hospice at present.  Increased SOB and DOE.  HISTORY OF PRESENT ILLNESS:  [name] is a 87 y.o. year old female with 8 year history of Idiopathic Pulmonary Fibrosis and Chronic Respiratory failure, O2 dependent on 8 L [place].  Pt had an Oxygen tank that fell on her right leg back in October causing a laceration which pt has been being seen at the wound clinic at [facility] for on a weekly basis for debridement.  She was evaluated this past weekend for Hospice service  admission and was denied due to being too highly functional.  Although pt remains continent of bowel and bladder, able to bathe and dress herself independently this is misleading about her overall functional status.  What used to take patient 30 minutes to get ready bathing and dressing now takes 2 hours and frequent rest periods.  She went out to dinner with friends Saturday night.  Pt has tried to keep as normal a life as possible, still interact with friends, plays bridge and  highly values her independence.  She had been taking a portable O2 supply with her which would not go up to 8L and although her O2 sats would drop she was able to tolerate this some.  On Saturday she went to dinner but was unable to travel home until her husband went and got an O2 tank to get her home.  She states "I realized at that point that I am not going to be able to go out to eat any more."  At rest she is tachypneic at 28 and dyspneic with hands and feet with distal patchy cyanotic toes plantar surface of foot and patchy cyanosis of hands. Just in sitting and talking with this provider her O2 sat dropped from 97% to 95% with heart rate increase from upper 80s to 104.  Patient was ambulated approximately 150 feet and sat dropped to 94%, heart rate was 91 and resp rate increased to 34 still on 8 L. At this point she is unable to speak more than 2 or 3 words without having to stop to breathe. She has significant pain in her right leg where the wound is that is interfering with her sleep.  She tried Tramadol but felt like this "zonked me".  Pt is fearful of trying Gabapentin for the neuropathic pain in her leg for fear of respiratory depression.  She and her spouse Reggie have been married 67 years and she has significant care and concern for his support and well being as well as wanting to be kept comfortable in her own home.   History obtained from review of EMR, discussion with primary team, and interview with family, facility staff/caregiver and/or Ms. [name].  I reviewed available labs, medications, imaging, studies and related documents from the EMR.  Records reviewed and summarized above.   ROS General: NAD, easily fatigues EYES: denies vision changes ENMT: denies dysphagia Cardiovascular: denies chest pain, denies DOE Pulmonary: states cough only rarely productive, endorses increased SOB Abdomen: endorses fair appetite, denies constipation, endorses continence of bowel GU: denies dysuria,  endorses continence of urine MSK:  denies increased weakness,  no falls reported Skin: denies rashes or wounds Neurological: denies pain, denies insomnia Psych: Endorses positive mood Heme/lymph/immuno: denies bruises, abnormal bleeding  Physical Exam: Constitutional: NAD, appears fatigued General: frail appearing, thin EYES: anicteric sclera, lids intact, no discharge  ENMT: intact hearing, oral mucous membranes moist, dentition intact CV: S1S2, RRR, 3+ BLE edema with ace wrap on  Pulmonary: CTAB, no increased work of breathing, no cough,  Abdomen: intake 100%, normo-active BS + 4 quadrants, soft and non tender, no ascites GU: deferred MSK: no sarcopenia, moves all extremities, ambulatory Skin: warm and dry, no rashes or wounds on visible skin Neuro:  no generalized weakness,  no cognitive impairment Psych: non-anxious affect, A and O x 3 Hem/lymph/immuno: no widespread bruising   Thank you for the opportunity to participate in the care of Ms. [name].  The palliative care team will continue to follow. Please call our  office at [phone] if we can be of additional assistance.   Marijo Conception, FNP   COVID-19 PATIENT SCREENING TOOL Asked and negative response unless otherwise noted:   Have you had symptoms of covid, tested positive or been in contact with someone with symptoms/positive test in the past 5-10 days? no

## 2021-03-17 NOTE — Progress Notes (Signed)
COMMUNITY PALLIATIVE CARE SW NOTE  PATIENT NAME: [name] DOB: May 13, 1934 MRN: [id number]  PRIMARY CARE PROVIDER: [name], MD  RESPONSIBLE PARTY:  Acct ID - Guarantor Home Phone Work Phone Relationship Acct Type  [id number] Sandy [phone]  Self P/F     [place], [place], [place] [address]   SOCIAL WORK TELEPHONE ENCOUNTER  (11:20 am) SW completed a follow-up call to patient per request of NP-K. Highfill to provide support to her. Patient expressed her desire for comfort care under hospice. Patient state that she is currently going to the wound clinic, but would like to transition from this service as she has not seen any improvement. Patient believes her breathing is worse than her wound at this point as she is having increased SOB, fatigue and weakness with minimal exertion. Patient feels that she and her husband could benefit from hospice care. She understands and desires comfort care at this time. SW provided supportive counseling while validating her feelings and advised that she will discuss her case further with the NP for follow-up.  No other concerns noted.   [address] [place], [place]

## 2021-03-18 ENCOUNTER — Telehealth: Payer: Medicare HMO | Admitting: Internal Medicine

## 2021-03-18 ENCOUNTER — Other Ambulatory Visit: Payer: Self-pay

## 2021-03-21 DIAGNOSIS — J849 Interstitial pulmonary disease, unspecified: Secondary | ICD-10-CM | POA: Diagnosis not present

## 2021-03-22 ENCOUNTER — Telehealth: Payer: Self-pay | Admitting: Internal Medicine

## 2021-03-22 NOTE — Telephone Encounter (Signed)
Called Almyra Free but she did not answer. Left message for her to call back.

## 2021-04-01 ENCOUNTER — Telehealth: Payer: Self-pay | Admitting: Internal Medicine

## 2021-04-01 NOTE — Telephone Encounter (Signed)
This has been placed in pharmacy box for them to follow up on.

## 2021-04-02 NOTE — Telephone Encounter (Signed)
Thanks.   DevkiL Not sure she needs ofev anymore. SHe can have it if she wants it but not required.  tRiagE: I will try to visit them this weekend. I know she wants me to drop in with Gae Bon . She lives nearby. I will tomorrow or Sunday by myself +/- Gae Bon

## 2021-04-02 NOTE — Telephone Encounter (Signed)
Submitted Patient Assistance RENEWAL Application to [place] for OFEV along with provider portion, patient portion, med list, insurance card copy, PA and income documents. Will update patient when we receive a response.  Fax# [phone] Phone# [phone]  Patient's husband left note with application stating that patient has been admited into hospice program. Per his note "she is doing ok but not great. Thought you might want to know." Routing to Dr. Denyse Amass, PharmD, MPH, BCPS Clinical Pharmacist (Rheumatology and Pulmonology)

## 2021-04-02 NOTE — Telephone Encounter (Signed)
Submitted a Prior Authorization request to [facility] for OFEV via CoverMyMeds. Will update once we receive a response.  KeyParticia Jasper PA Case ID: [id number]  Prior authorization approval to be faxed with application.  Knox Saliva, PharmD, MPH, BCPS Clinical Pharmacist (Rheumatology and Pulmonology)

## 2021-04-05 NOTE — Telephone Encounter (Signed)
Received fax from [facility] stating that patient's household size was missing from application. Completed and refaxed.  Spoke with patient's husband regarding Ofev being discontinued if patient wanted to do so though it was up to her discretion at this time. He states that the Ofev helps with patient's digestive issues more than other medication and she would prefer to stay on it. I advised that Dr. [name] may discuss further when he meets with them.  Knox Saliva, PharmD, MPH, BCPS Clinical Pharmacist (Rheumatology and Pulmonology)

## 2021-04-07 ENCOUNTER — Telehealth: Payer: Self-pay | Admitting: Internal Medicine

## 2021-04-07 NOTE — Telephone Encounter (Signed)
Met with [name] yesterday at her house for soicial visit onhospice. She said that while at [facility] they were applyin a cream that was helping her leg ulcer debridement. I told her  iwould review her records and this is what I founds  Clean wound: Normal saline solution  + Apply: 5% topical lidocaine to wound bed for debridement    Pleae check with her if this is what it is. IF so, I am happy to prescribe this for her as her hospice attending. The formal request will have to come from hospice

## 2021-04-08 NOTE — Telephone Encounter (Signed)
Received a fax from  [facility] regarding an approval for [place] patient assistance from 04/06/2021 to 02/27/2022. Approval letter sent to scan center.  Phone number: [phone]

## 2021-04-09 MED ORDER — LIDOCAINE 5 % EX OINT: 1.0000 "application " | TOPICAL_OINTMENT | CUTANEOUS | 3 refills | Status: AC | PRN

## 2021-04-09 NOTE — Telephone Encounter (Signed)
Called and spoke with pt letting her know that MR was fine with [place] ordering the normal saline solution plus the topical lidocaine to have when the leg wound is needing to be debrided and she verbalized understanding.  Order has been placed. Nothing further needed.

## 2021-04-12 ENCOUNTER — Telehealth: Payer: Self-pay | Admitting: Pharmacy Technician

## 2021-04-12 ENCOUNTER — Other Ambulatory Visit (HOSPITAL_COMMUNITY): Payer: Self-pay

## 2021-04-12 NOTE — Telephone Encounter (Signed)
Patient Advocate Encounter Received notification from [facility] regarding a prior authorization for LIDOCAINE 5% OINT. Authorization has been APPROVED from 2.13.23 to 5.14.23.   Per [facility] test claim, copay for 30 days supply is $99.85   Authorization # PA Case ID: [id number]    Received notification from [facility]) that prior authorization for LIDOCAINE 5% OINT is required.   PA submitted on 2.13.23 Key [id number]  Status is pending   [facility] will continue to follow  Luciano Cutter, CPhT Patient Advocate Phone: [phone] Fax:  [phone]

## 2021-04-13 ENCOUNTER — Telehealth: Payer: Self-pay | Admitting: Internal Medicine

## 2021-04-14 DIAGNOSIS — J849 Interstitial pulmonary disease, unspecified: Secondary | ICD-10-CM | POA: Diagnosis not present

## 2021-04-19 ENCOUNTER — Telehealth: Payer: Self-pay | Admitting: Internal Medicine

## 2021-04-19 NOTE — Telephone Encounter (Signed)
[name]   Could you pleae cal husband Kripa Foskey and have him email me a pic of his . Please get his ok and if he is ok with it he can email me. At Nov 2023 annual PFF meeting, they like to honor IPF patients near and dear to the cause of the disease  Thanks    SIGNATURE    Dr. Brand Males, M.D., F.C.C.P,  Pulmonary and Critical Care Medicine Staff Physician, [place] Director - Interstitial Lung Disease  Program  Pulmonary [place] at [place], [place], [address]  NPI Number:  NPI #[id number] [facility] Number: [id number]  Pager: [phone], If no answer  -> Check AMION or Try [phone] Telephone (clinical office): [phone] Telephone (research): [phone]  11:00 AM 04/19/2021

## 2021-04-20 NOTE — Telephone Encounter (Signed)
Called and spoke with pt's spouse Herbie [place] letting him know the info per MR. Robert asked if MR could send him an email to his address so that way he would have it.  Robert's email address is [email or web address]   He also wanted me to let MR know that there is a service being held for pt on March 13 at 2pm at [facility] in [place].  Routing all this to MR. Nothing further needed.

## 2021-04-21 DIAGNOSIS — J849 Interstitial pulmonary disease, unspecified: Secondary | ICD-10-CM | POA: Diagnosis not present

## 2021-04-28 NOTE — Telephone Encounter (Signed)
Spoke to husband 7:43 PM Apr 14, 2021 - expressed condolences  Sunday 2/12 - got class 4 dyspnea and could not even lie down on side. Hospice nurse called at short notice - started on q2h oral morphine that helped. After that quickly declined and passed away 04/14/21   Husband appreciated the care  He will drop off ofev bottles   SIGNATURE    Dr. Brand Males, M.D., F.C.C.P,  Pulmonary and Critical Care Medicine Staff Physician, [place] Director - Interstitial Lung Disease  Program  Pulmonary [place] at [place], [place], [address]  NPI Number:  NPI #[id number] [facility] Number: [id number]  Pager: [phone], If no answer  -> Check AMION or Try [phone] Telephone (clinical office): [phone] Telephone (research): [phone]  7:45 PM 04/14/21

## 2021-04-28 NOTE — Telephone Encounter (Signed)
There is a request in the  Vital Records DAVE system to certify the death certificate for this patient - she passed away on 05/02/2022.  Dr. [name] is listed as the certifier because he attended her under Hospice.  Medical information needs to be acquired in order to complete the death certificate.  DAVE ID# is [id number]

## 2021-04-28 NOTE — Telephone Encounter (Signed)
Fax received from [facility] stating that pt passed away 04-16-21 at 3:29am.  Condolence card obtained and given to MR.  Routing this encounter to him so he can take care of the death certificate.

## 2021-04-28 DEATH — deceased

## 2021-05-21 ENCOUNTER — Encounter (HOSPITAL_COMMUNITY): Payer: Medicare HMO | Admitting: Cardiology

## 2021-05-21 ENCOUNTER — Other Ambulatory Visit (HOSPITAL_COMMUNITY): Payer: Medicare HMO

## 2021-10-24 IMAGING — CT CT CHEST HIGH RESOLUTION W/O CM
2 of 8 series · 14 of 36 positions shown, 17 images · non-contrast
Comparison: Chest CT 12/10/2018.

CLINICAL DATA: 85-year-old female with history of interstitial lung
disease. Worsening oxygenation and shortness of breath since
[REDACTED].

EXAM:
CT CHEST WITHOUT CONTRAST
TECHNIQUE: Multidetector CT imaging of the chest was performed following the
standard protocol without intravenous contrast. High resolution
imaging of the lungs, as well as inspiratory and expiratory imaging,
was performed.

[Series 5: high resolution · axial · 0.65mm/px · z∈[-265,-42]mm · 11 of 269 slices shown, 14 images]
[im 23/269  mediastinal]
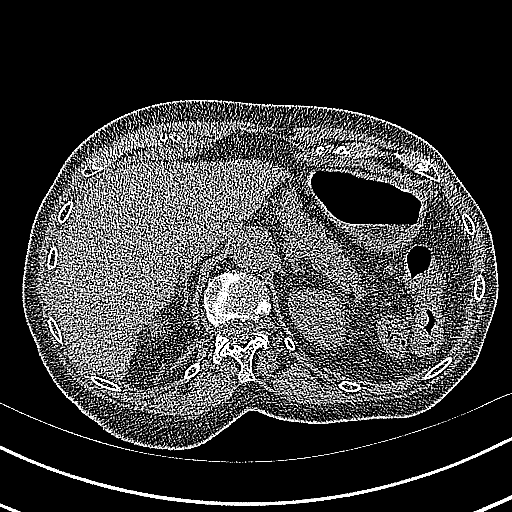
[im 23/269  lung]
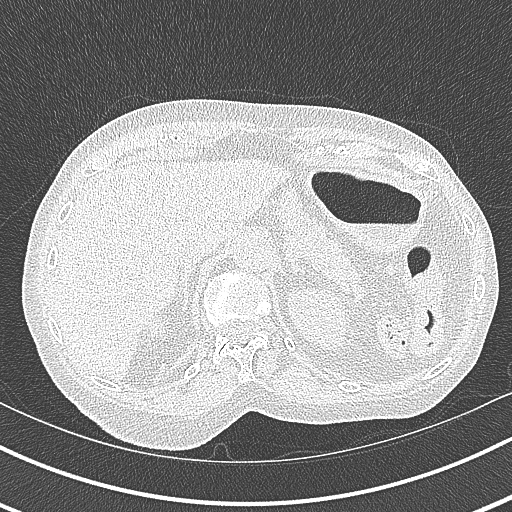
[im 45/269  lung]
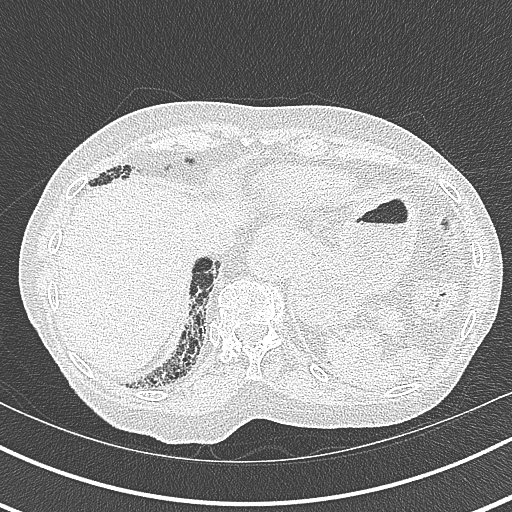
[im 68/269  lung]
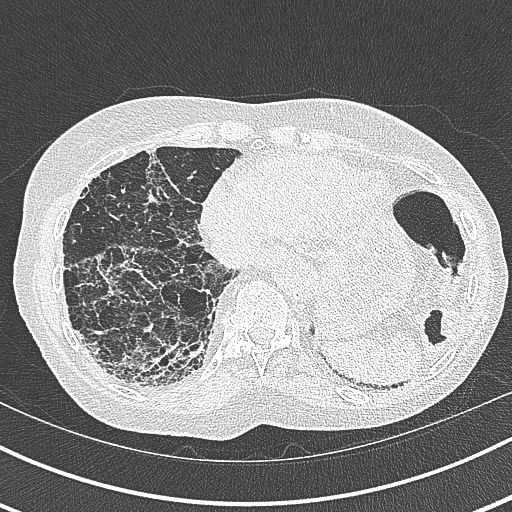
[im 90/269  lung]
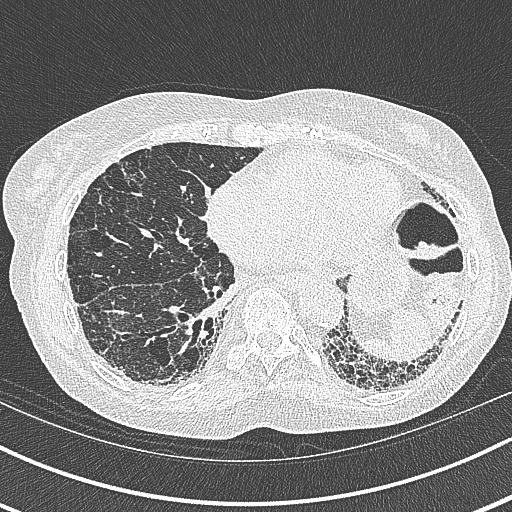
[im 112/269  mediastinal]
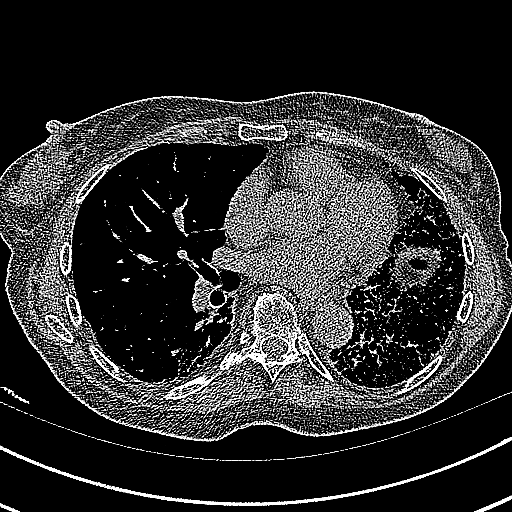
[im 112/269  lung]
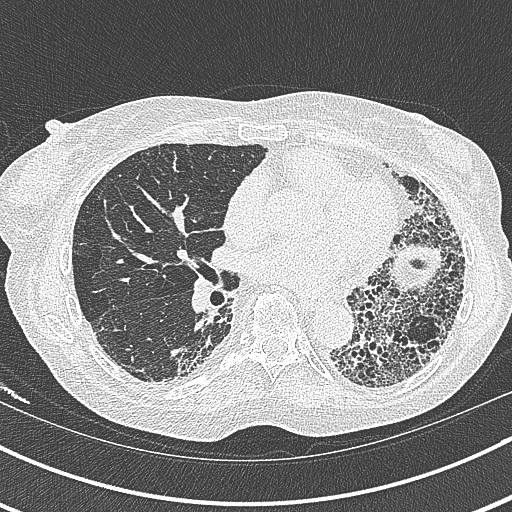
[im 135/269  lung]
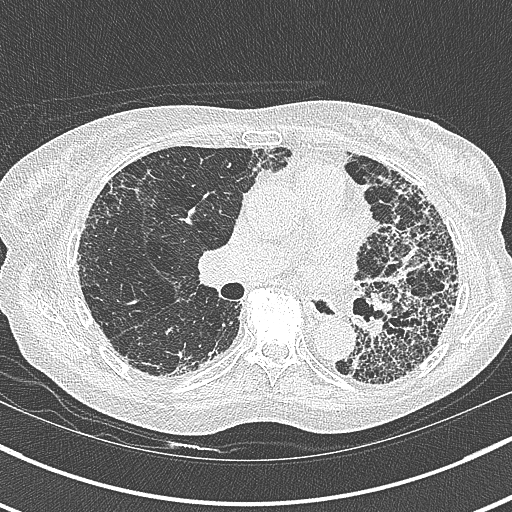
[im 157/269  lung]
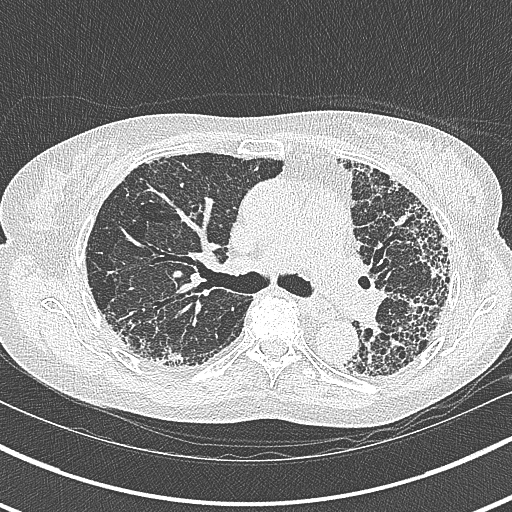
[im 179/269  lung]
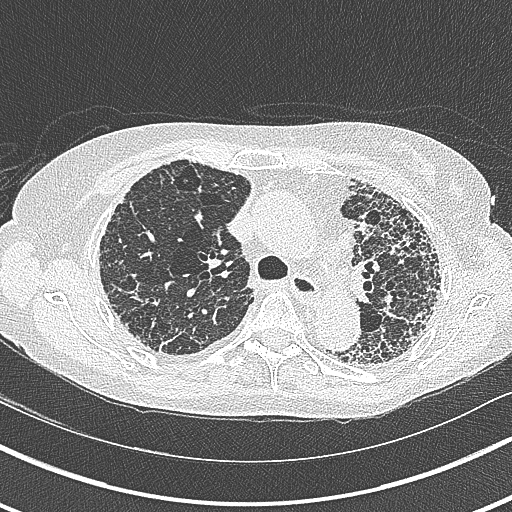
[im 202/269  mediastinal]
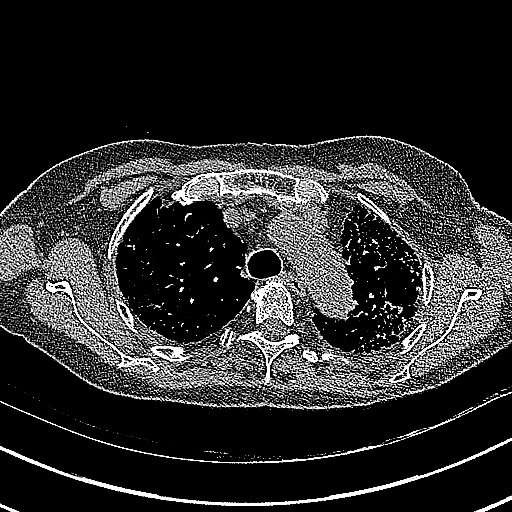
[im 202/269  lung]
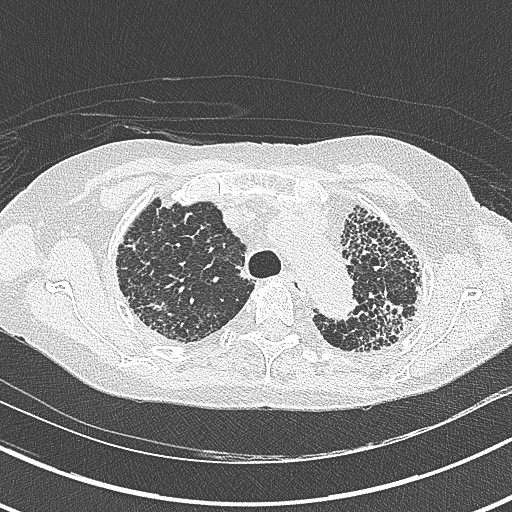
[im 224/269  lung]
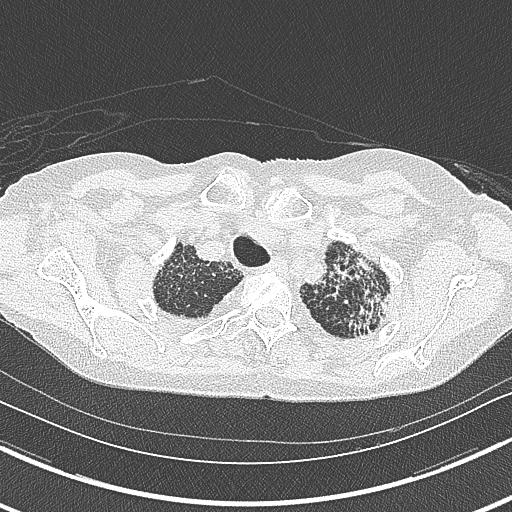
[im 246/269  lung]
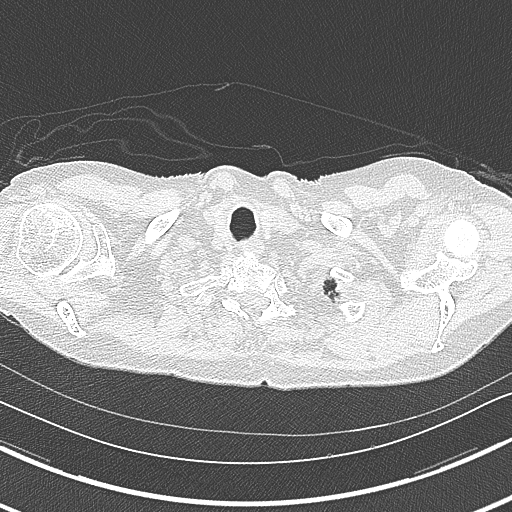

[Series 7: coronal · coronal · 0.56mm/px · 3 of 105 slices shown]
[im 21/105  lung]
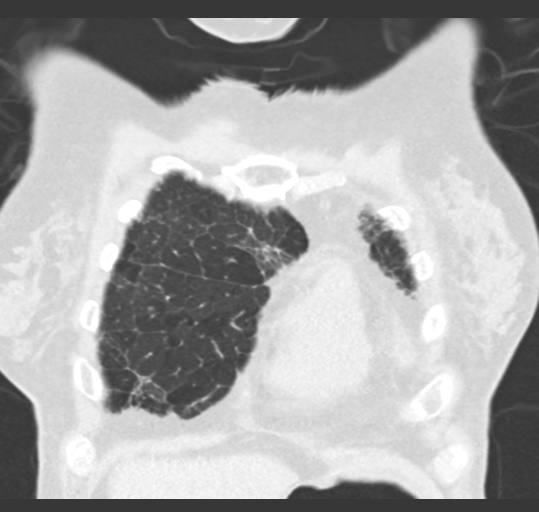
[im 42/105  lung]
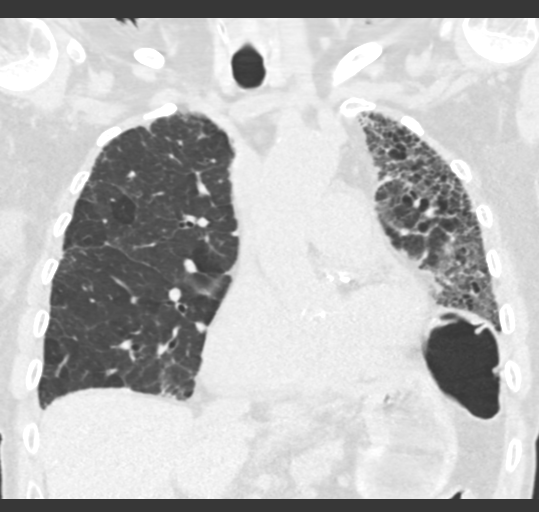
[im 63/105  lung]
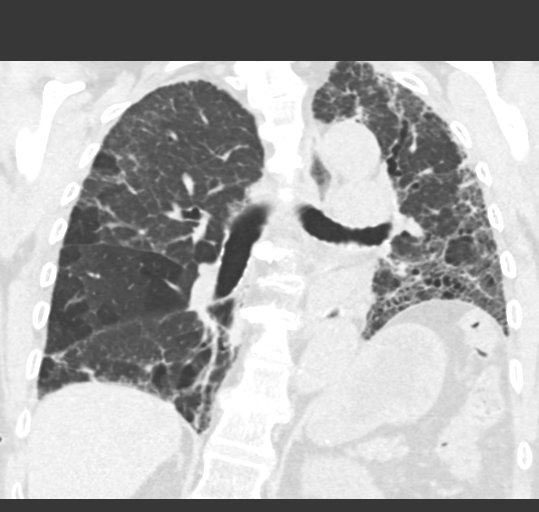

[14 of 36 positions shown; findings below may reference images not displayed]

FINDINGS: Cardiovascular: Heart size is normal. There is no significant
pericardial fluid, thickening or pericardial calcification. There is
aortic atherosclerosis, as well as atherosclerosis of the great
vessels of the mediastinum and the coronary arteries, including
calcified atherosclerotic plaque in the left main, left anterior
descending, left circumflex and right coronary arteries.
Calcifications of the aortic valve.

Mediastinum/Nodes: No pathologically enlarged mediastinal or hilar
lymph nodes. Please note that accurate exclusion of hilar adenopathy
is limited on noncontrast CT scans. Esophagus is unremarkable in
appearance. No axillary lymphadenopathy.

Lungs/Pleura: High-resolution images demonstrate widespread areas of
septal thickening, subpleural reticulation, traction bronchiectasis,
parenchymal banding and extensive honeycombing. Findings are
asymmetric (left greater than right). In the right lung, there is a
definitive craniocaudal gradient. Overall, findings are progressive
compared to prior study from 1515. No acute consolidative airspace
disease. No pleural effusions. No definite suspicious appearing
pulmonary nodules or masses are noted.

Upper Abdomen: Elevation of the left hemidiaphragm. Aortic
atherosclerosis.

Musculoskeletal: There are no aggressive appearing lytic or blastic
lesions noted in the visualized portions of the skeleton.
IMPRESSION: 1. Progressive worsening interstitial lung disease, with a spectrum
of findings considered diagnostic of usual interstitial pneumonia
(UIP) per current ATS guidelines. The asymmetry of the findings
(left greater than right) may be related to chronic left
hemidiaphragm paralysis.
2. Aortic atherosclerosis, in addition to left main and 3 vessel
coronary artery disease.
3. There are calcifications of the aortic valve. Echocardiographic
correlation for evaluation of potential valvular dysfunction may be
warranted if clinically indicated.

Aortic Atherosclerosis (U4KIM-ND6.6).

## 2022-10-20 IMAGING — DX DG TIBIA/FIBULA 2V*L*
1 series · 4 of 4 positions shown · non-contrast
Comparison: None.

CLINICAL DATA: Injury, skin tear

EXAM:
LEFT TIBIA AND FIBULA - 2 VIEW

[Series 1: leg · 0.14mm/px · 4 of 4 slices shown]
[im 1/4]
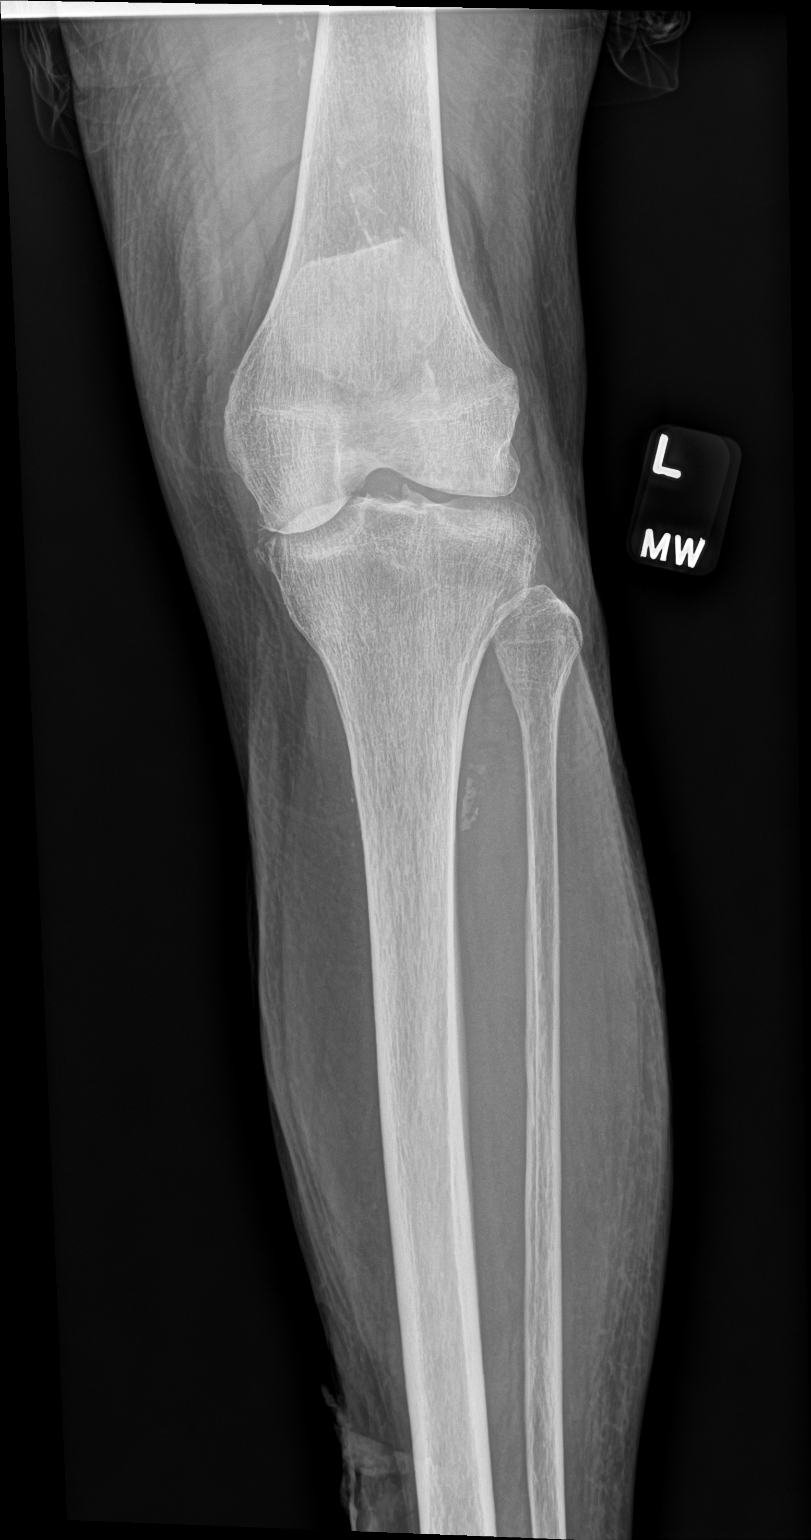
[im 2/4]
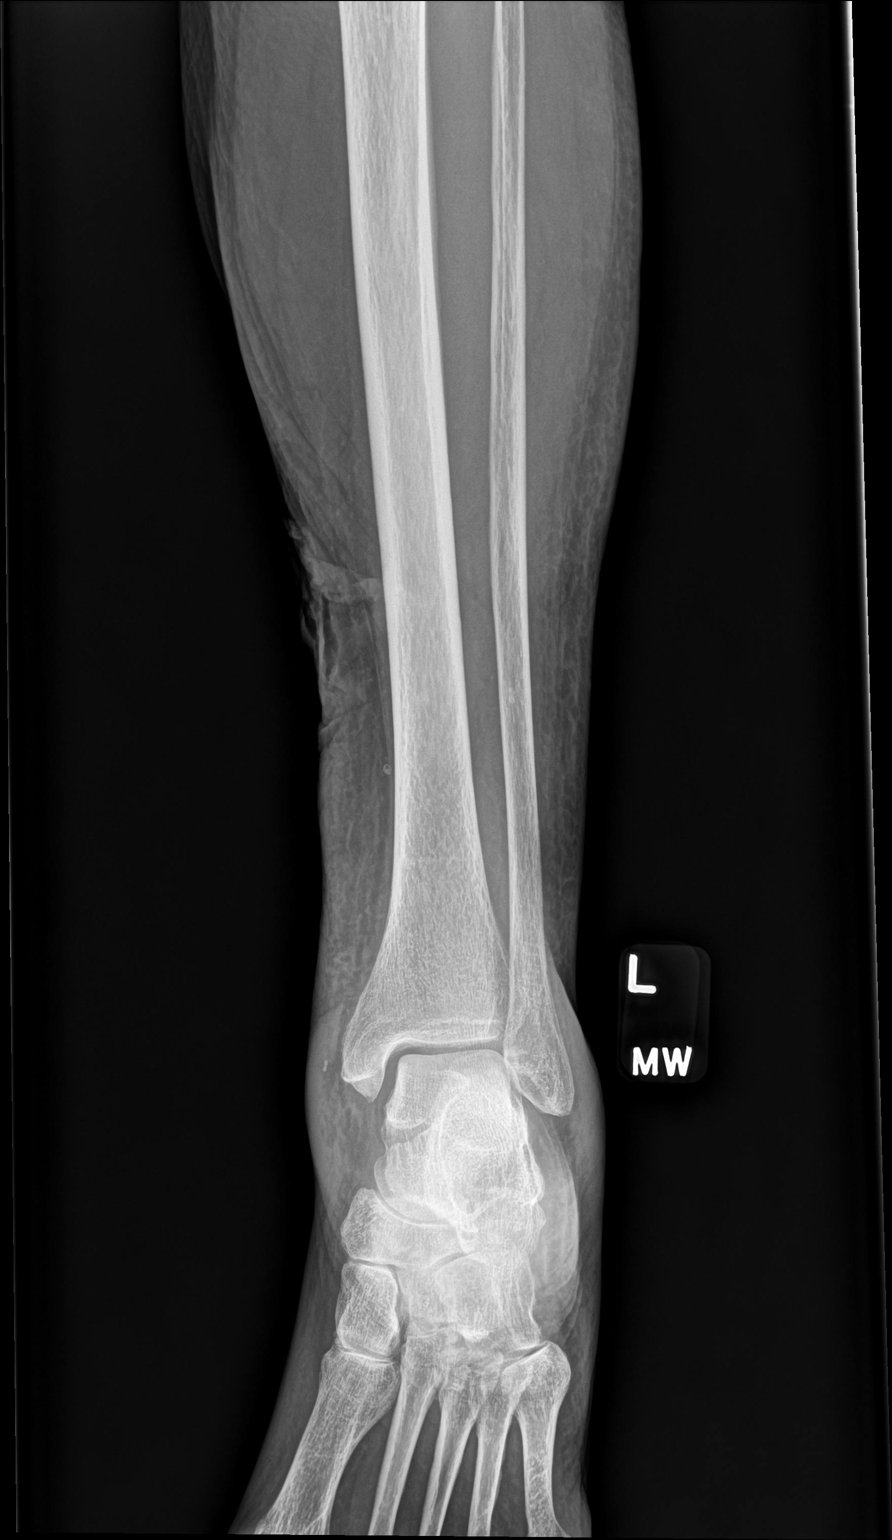
[im 3/4]
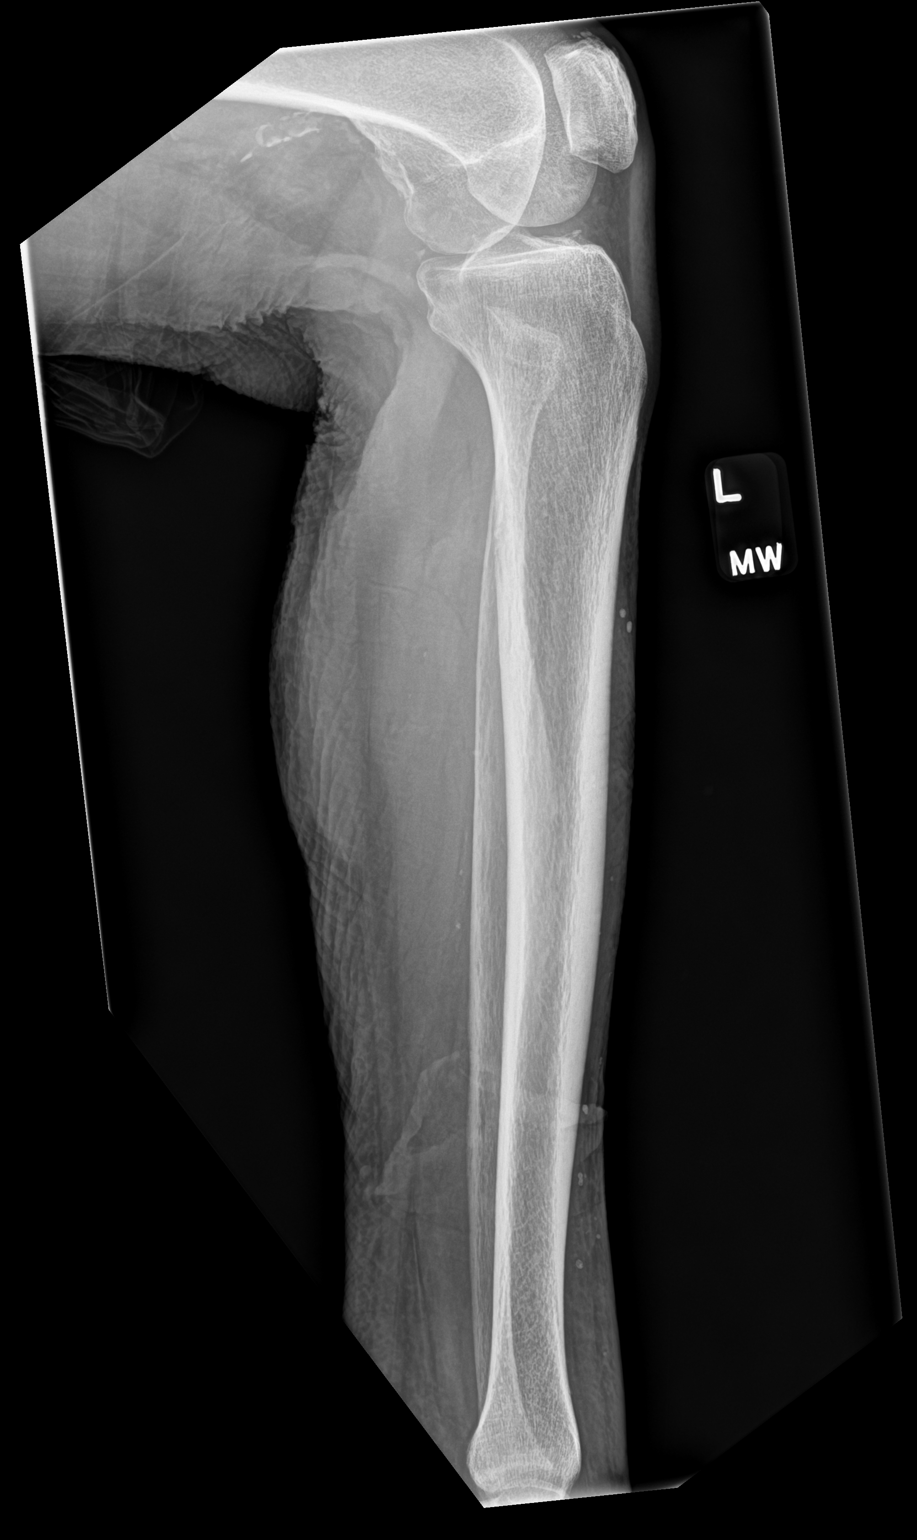
[im 4/4]
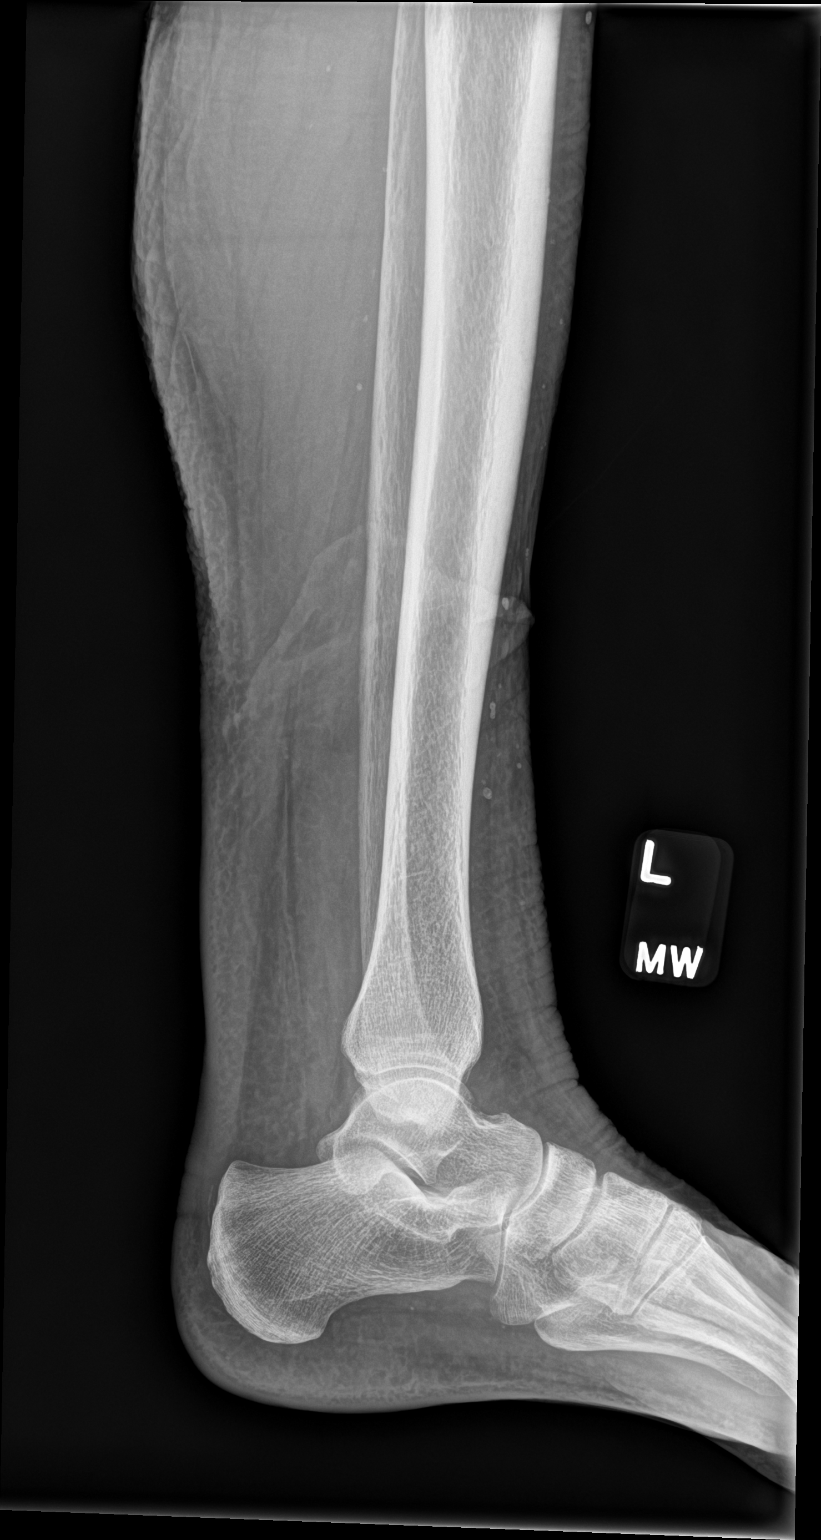

[4 of 4 positions shown; findings below may reference images not displayed]

FINDINGS: There is no evidence of fracture or other focal bone lesions. Soft
tissue defect along the medial aspect of the mid to distal lower
leg. No radiopaque foreign body. Mild diffuse soft tissue edema.
Scattered nonspecific soft tissue calcifications.
IMPRESSION: 1. No acute osseous abnormality of the left tibia or fibula.
2. Soft tissue defect along the medial aspect of the mid to distal
lower leg.
